# Patient Record
Sex: Male | Born: 1980 | Race: White | Hispanic: No | Marital: Single
Health system: Midwestern US, Community
[De-identification: ages and names within clinical notes are randomized; demographics above are authoritative.]

## PROBLEM LIST (undated history)

## (undated) DIAGNOSIS — K37 Unspecified appendicitis: Secondary | ICD-10-CM

## (undated) DIAGNOSIS — Z765 Malingerer [conscious simulation]: Secondary | ICD-10-CM

## (undated) DIAGNOSIS — M869 Osteomyelitis, unspecified: Secondary | ICD-10-CM

## (undated) DIAGNOSIS — I10 Essential (primary) hypertension: Secondary | ICD-10-CM

## (undated) DIAGNOSIS — N200 Calculus of kidney: Secondary | ICD-10-CM

## (undated) DIAGNOSIS — S31119A Laceration without foreign body of abdominal wall, unspecified quadrant without penetration into peritoneal cavity, initial encounter: Secondary | ICD-10-CM

## (undated) DIAGNOSIS — T63001A Toxic effect of unspecified snake venom, accidental (unintentional), initial encounter: Secondary | ICD-10-CM

## (undated) HISTORY — PX: WRIST SURGERY: SHX841

## (undated) HISTORY — PX: EXPLORATORY LAPAROTOMY: SUR591

## (undated) HISTORY — PX: WISDOM TOOTH EXTRACTION: SHX21

## (undated) HISTORY — PX: APPENDECTOMY: SHX54

---

## 2004-11-29 ENCOUNTER — Emergency Department: Payer: Self-pay | Admitting: Emergency Medicine

## 2005-01-20 ENCOUNTER — Emergency Department: Payer: Self-pay | Admitting: General Practice

## 2005-04-04 ENCOUNTER — Emergency Department: Payer: Self-pay | Admitting: Emergency Medicine

## 2008-12-14 DIAGNOSIS — S31119A Laceration without foreign body of abdominal wall, unspecified quadrant without penetration into peritoneal cavity, initial encounter: Secondary | ICD-10-CM

## 2008-12-14 HISTORY — DX: Laceration without foreign body of abdominal wall, unspecified quadrant without penetration into peritoneal cavity, initial encounter: S31.119A

## 2009-12-21 DIAGNOSIS — G8911 Acute pain due to trauma: Secondary | ICD-10-CM | POA: Insufficient documentation

## 2009-12-21 DIAGNOSIS — K6389 Other specified diseases of intestine: Secondary | ICD-10-CM | POA: Insufficient documentation

## 2010-01-15 ENCOUNTER — Emergency Department (HOSPITAL_COMMUNITY): Admission: EM | Admit: 2010-01-15 | Discharge: 2010-01-15 | Payer: Self-pay | Admitting: Emergency Medicine

## 2013-02-21 DIAGNOSIS — Z720 Tobacco use: Secondary | ICD-10-CM | POA: Insufficient documentation

## 2013-03-30 DIAGNOSIS — M869 Osteomyelitis, unspecified: Secondary | ICD-10-CM | POA: Insufficient documentation

## 2013-04-12 DIAGNOSIS — M40209 Unspecified kyphosis, site unspecified: Secondary | ICD-10-CM | POA: Insufficient documentation

## 2013-04-21 DIAGNOSIS — T310 Burns involving less than 10% of body surface: Secondary | ICD-10-CM | POA: Insufficient documentation

## 2013-05-04 ENCOUNTER — Emergency Department (HOSPITAL_COMMUNITY)
Admission: EM | Admit: 2013-05-04 | Discharge: 2013-05-05 | Disposition: A | Discharge status: Home / Self Care | Attending: Emergency Medicine | Admitting: Emergency Medicine

## 2013-05-04 ENCOUNTER — Emergency Department (HOSPITAL_COMMUNITY)

## 2013-05-04 DIAGNOSIS — S53401A Unspecified sprain of right elbow, initial encounter: Secondary | ICD-10-CM

## 2013-05-04 DIAGNOSIS — Z8739 Personal history of other diseases of the musculoskeletal system and connective tissue: Secondary | ICD-10-CM | POA: Insufficient documentation

## 2013-05-04 DIAGNOSIS — Y939 Activity, unspecified: Secondary | ICD-10-CM | POA: Insufficient documentation

## 2013-05-04 DIAGNOSIS — Y9210 Unspecified residential institution as the place of occurrence of the external cause: Secondary | ICD-10-CM | POA: Insufficient documentation

## 2013-05-04 DIAGNOSIS — IMO0002 Reserved for concepts with insufficient information to code with codable children: Secondary | ICD-10-CM | POA: Insufficient documentation

## 2013-05-04 DIAGNOSIS — M795 Residual foreign body in soft tissue: Secondary | ICD-10-CM | POA: Insufficient documentation

## 2013-05-04 DIAGNOSIS — IMO0001 Reserved for inherently not codable concepts without codable children: Secondary | ICD-10-CM | POA: Insufficient documentation

## 2013-05-04 DIAGNOSIS — W06XXXA Fall from bed, initial encounter: Secondary | ICD-10-CM | POA: Insufficient documentation

## 2013-05-04 DIAGNOSIS — Z79899 Other long term (current) drug therapy: Secondary | ICD-10-CM | POA: Insufficient documentation

## 2013-05-04 MED ORDER — FENTANYL CITRATE 0.05 MG/ML IJ SOLN
50.0000 ug | Freq: Once | INTRAMUSCULAR | Status: AC
  Administered 2013-05-04: 22:00:00 via INTRAVENOUS
  Filled 2013-05-04: qty 2

## 2013-05-04 NOTE — ED Notes (Addendum)
Fell on rt. Arm and there hyperextension or rt arm; stiff . Numbness, cap refill < 4 secs, numbness in fingers.

## 2013-05-04 NOTE — ED Notes (Signed)
Returned fm xray asking for pain medication

## 2013-05-04 NOTE — ED Notes (Signed)
Patient transported to X-ray 

## 2013-05-04 NOTE — ED Provider Notes (Signed)
History     CSN: [id number]  Arrival date & time 05/04/13  2034   First MD Initiated Contact with Patient 05/04/13 2132      Chief Complaint  Patient presents with  . Elbow Injury     Patient is a 32 y.o. male presenting with arm injury. The history is provided by the patient.  Arm Injury Location:  Elbow Elbow location:  R elbow Pain details:    Quality:  Aching   Radiates to:  Does not radiate   Severity:  Severe   Onset quality:  Sudden   Duration:  3 hours   Timing:  Constant   Progression:  Worsening Chronicity:  New Relieved by:  Nothing Worsened by:  Movement Associated symptoms: no neck pain    [facility] from [facility] He reports that he fell getting out of bed and landed on his right elbow and he think he injured the elbow He denies any head or neck injury.  No LOC.  No cp/sob.  No LE injury He has never had elbow surgery before   PMH - osteomyelitis of spine (he is on current meds - levaquin)  No past surgical history on file.  No family history on file.  History  Substance Use Topics  . Smoking status: Not on file  . Smokeless tobacco: Not on file  . Alcohol Use: Not on file      Review of Systems  HENT: Negative for neck pain.   Respiratory: Negative for shortness of breath.   Cardiovascular: Negative for chest pain.  Neurological: Negative for weakness.  All other systems reviewed and are negative.    Allergies  Penicillins; Sulfa antibiotics; and Toradol  Home Medications  No current outpatient prescriptions on file.  BP 136/91  Pulse 102  Temp(Src) 97.6 F (36.4 C) (Oral)  Resp 22  SpO2 100%  Physical Exam CONSTITUTIONAL: Well developed/well nourished HEAD: Normocephalic/atraumatic EYES: EOMI/PERRL ENMT: Mucous membranes moist NECK: supple no meningeal signs SPINE:no cervical spine tenderness. CV: S1/S2 noted, no murmurs/rubs/gallops noted LUNGS: Lungs are clear to auscultation bilaterally, no apparent distress ABDOMEN:  soft, nontender, no rebound or guarding GU:no cva tenderness NEURO: Pt is awake/alert, moves all extremitiesx4 EXTREMITIES: pulses normal/equal in bilateral UE.  He keeps right elbow extended and has diffuse tenderness to right elbow.  No lacerations noted.  He has well healed scar to right AC fossa.  He has no right wrist tenderness.  No right shoulder tenderness.  Distal motor intact on right UE SKIN: warm, color normal PSYCH: no abnormalities of mood noted  ED Course  Procedures  Labs Reviewed - No data to display Dg Elbow 2 Views Right  05/04/2013   *RADIOLOGY REPORT*  Clinical Data: Fall, right elbow pain  RIGHT ELBOW - 2 VIEW  Comparison: 01/15/2010  Findings: No fracture or dislocation is seen.  5 mm radiopaque foreign body in the medial/ventral soft tissues of the proximal forearm.  The presence of an elbow joint effusion cannot be assessed due to difficulty with patient positioning.  IMPRESSION: No fracture or dislocation is seen.  5 mm radiopaque foreign body along the medial aspect of the proximal forearm.   Original Report Authenticated By: Charline Bills, M.D.     No fx or dislocation noted He can now flex his right elbow and no bony deformity No other bony injury He has well healed scar to right AC fossa but no abscess or erythema His xray does reveal a foreign body that patient was unaware of  He did not report h/o IVDA reports that scar is from an IV at a previous hospital I did review records from [facility] via care everywhere and he was seen there for his osteomyelitis I feel he safe for d/c and he can f/u as outpatient and does not need this foreign body removed as it does not appear acute   MDM  Nursing notes including past medical history and social history reviewed and considered in documentation xrays reviewed and considered         Joya Gaskins, MD 05/05/13 0031

## 2013-05-05 NOTE — ED Notes (Signed)
Pt in neg visible distress

## 2013-05-06 ENCOUNTER — Encounter (HOSPITAL_BASED_OUTPATIENT_CLINIC_OR_DEPARTMENT_OTHER): Payer: Self-pay

## 2013-05-06 ENCOUNTER — Inpatient Hospital Stay (HOSPITAL_BASED_OUTPATIENT_CLINIC_OR_DEPARTMENT_OTHER)
Admission: EM | Admit: 2013-05-06 | Discharge: 2013-05-08 | DRG: 918 | Disposition: A | Discharge status: Home / Self Care | Payer: MEDICAID | Attending: Internal Medicine | Admitting: Internal Medicine

## 2013-05-06 DIAGNOSIS — M549 Dorsalgia, unspecified: Secondary | ICD-10-CM | POA: Diagnosis present

## 2013-05-06 DIAGNOSIS — Z720 Tobacco use: Secondary | ICD-10-CM

## 2013-05-06 DIAGNOSIS — Y929 Unspecified place or not applicable: Secondary | ICD-10-CM

## 2013-05-06 DIAGNOSIS — T6391XA Toxic effect of contact with unspecified venomous animal, accidental (unintentional), initial encounter: Principal | ICD-10-CM | POA: Diagnosis present

## 2013-05-06 DIAGNOSIS — F1120 Opioid dependence, uncomplicated: Secondary | ICD-10-CM

## 2013-05-06 DIAGNOSIS — F1920 Other psychoactive substance dependence, uncomplicated: Secondary | ICD-10-CM | POA: Diagnosis present

## 2013-05-06 DIAGNOSIS — K759 Inflammatory liver disease, unspecified: Secondary | ICD-10-CM

## 2013-05-06 DIAGNOSIS — T63001A Toxic effect of unspecified snake venom, accidental (unintentional), initial encounter: Secondary | ICD-10-CM

## 2013-05-06 DIAGNOSIS — G8929 Other chronic pain: Secondary | ICD-10-CM

## 2013-05-06 DIAGNOSIS — F17200 Nicotine dependence, unspecified, uncomplicated: Secondary | ICD-10-CM | POA: Diagnosis present

## 2013-05-06 LAB — CBC WITH DIFFERENTIAL/PLATELET
Basophils Absolute: 0 10*3/uL (ref 0.0–0.1)
Basophils Relative: 0 % (ref 0–1)
Eosinophils Relative: 4 % (ref 0–5)
HCT: 37.5 % — ABNORMAL LOW (ref 39.0–52.0)
HCT: 34.1 % — ABNORMAL LOW (ref 39.0–52.0)
Hemoglobin: 13.3 g/dL (ref 13.0–17.0)
Hemoglobin: 12 g/dL — ABNORMAL LOW (ref 13.0–17.0)
Lymphocytes Relative: 32 % (ref 12–46)
MCHC: 35.2 g/dL (ref 30.0–36.0)
Monocytes Relative: 9 % (ref 3–12)
Monocytes Relative: 10 % (ref 3–12)
Neutro Abs: 4.3 10*3/uL (ref 1.7–7.7)
Neutrophils Relative %: 44 % (ref 43–77)
Neutrophils Relative %: 54 % (ref 43–77)
Platelets: 224 10*3/uL (ref 150–400)
Platelets: 203 10*3/uL (ref 150–400)
RBC: 4.38 MIL/uL (ref 4.22–5.81)
RBC: 3.96 MIL/uL — ABNORMAL LOW (ref 4.22–5.81)
RDW: 12.8 % (ref 11.5–15.5)
RDW: 12.7 % (ref 11.5–15.5)
WBC: 8.1 10*3/uL (ref 4.0–10.5)

## 2013-05-06 LAB — PROTIME-INR: Prothrombin Time: 13 seconds (ref 11.6–15.2)

## 2013-05-06 LAB — BASIC METABOLIC PANEL
BUN: 10 mg/dL (ref 6–23)
CO2: 24 mEq/L (ref 19–32)
Calcium: 8.9 mg/dL (ref 8.4–10.5)
Glucose, Bld: 95 mg/dL (ref 70–99)
Sodium: 142 mEq/L (ref 135–145)

## 2013-05-06 LAB — FIBRINOGEN: Fibrinogen: 254 mg/dL (ref 204–475)

## 2013-05-06 MED ORDER — HYDROMORPHONE HCL PF 1 MG/ML IJ SOLN
INTRAMUSCULAR | Status: AC
  Filled 2013-05-06: qty 1

## 2013-05-06 MED ORDER — SODIUM CHLORIDE 0.9 % IV SOLN
4.0000 | Freq: Once | INTRAVENOUS | Status: AC
  Administered 2013-05-06: 40 mL via INTRAVENOUS
  Filled 2013-05-06: qty 20
  Filled 2013-05-06: qty 10

## 2013-05-06 MED ORDER — LORAZEPAM 2 MG/ML IJ SOLN
1.0000 mg | Freq: Once | INTRAMUSCULAR | Status: AC
  Administered 2013-05-06: 1 mg via INTRAVENOUS

## 2013-05-06 MED ORDER — HYDROMORPHONE HCL PF 1 MG/ML IJ SOLN
1.0000 mg | Freq: Once | INTRAMUSCULAR | Status: AC
  Administered 2013-05-06: 1 mg via INTRAVENOUS
  Filled 2013-05-06: qty 1

## 2013-05-06 MED ORDER — SODIUM CHLORIDE 0.9 % IV SOLN: INTRAVENOUS | Status: DC

## 2013-05-06 MED ORDER — HYDROMORPHONE HCL PF 2 MG/ML IJ SOLN
INTRAMUSCULAR | Status: AC
  Filled 2013-05-06: qty 1

## 2013-05-06 MED ORDER — LORAZEPAM 2 MG/ML IJ SOLN
INTRAMUSCULAR | Status: AC
  Administered 2013-05-06: 1 mg via INTRAVENOUS
  Filled 2013-05-06: qty 1

## 2013-05-06 MED ORDER — SODIUM CHLORIDE 0.9 % IV BOLUS (SEPSIS)
2000.0000 mL | Freq: Once | INTRAVENOUS | Status: AC
  Administered 2013-05-06: 2000 mL via INTRAVENOUS
  Administered 2013-05-06 (×2): 1000 mL via INTRAVENOUS

## 2013-05-06 MED ORDER — HYDROMORPHONE HCL PF 1 MG/ML IJ SOLN
INTRAMUSCULAR | Status: AC
  Administered 2013-05-06: 1 mg
  Filled 2013-05-06: qty 1

## 2013-05-06 MED ORDER — HYDROMORPHONE HCL PF 2 MG/ML IJ SOLN
2.0000 mg | Freq: Once | INTRAMUSCULAR | Status: AC
  Administered 2013-05-06: 2 mg via INTRAVENOUS

## 2013-05-06 MED ORDER — CROTALIDAE POLYVAL IMMUNE FAB IV SOLR
INTRAVENOUS | Status: AC
  Administered 2013-05-06: 10 mL via INTRAVENOUS
  Filled 2013-05-06: qty 40

## 2013-05-06 MED ORDER — NICOTINE 21 MG/24HR TD PT24
MEDICATED_PATCH | TRANSDERMAL | Status: AC
  Administered 2013-05-06: 21 mg
  Filled 2013-05-06: qty 1

## 2013-05-06 MED ORDER — HYDROMORPHONE HCL PF 1 MG/ML IJ SOLN
INTRAMUSCULAR | Status: AC
  Administered 2013-05-06: 1 mg via INTRAVENOUS
  Filled 2013-05-06: qty 1

## 2013-05-06 MED ORDER — HYDROMORPHONE HCL PF 1 MG/ML IJ SOLN
1.0000 mg | Freq: Once | INTRAMUSCULAR | Status: AC
  Administered 2013-05-06: 1 mg via INTRAVENOUS
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
## 2013-05-06 NOTE — ED Provider Notes (Signed)
History    This chart was scribed for Hurman Horn, MD, by Frederik Pear, ED scribe. The patient was seen in room MH02/MH02 and the patient's care was started at 1709.    CSN: [id number]  Arrival date & time 05/06/13  1651   First MD Initiated Contact with Patient 05/06/13 1709      Chief Complaint  Patient presents with  . Snake Bite    (Consider location/radiation/quality/duration/timing/severity/associated sxs/prior treatment) The history is provided by the patient and medical records. No language interpreter was used.    HPI Comments: Carl Valencia is a 32 y.o. male brought in by EMS who presents to the Emergency Department complaining of a copperhead snake bite to the right forearm with associated nausea and apprehension that occurred just after 1400. He states that the snake was laying in the grass when he was bitten. EMS states that he was given 300 mcg of Fentanyl, 4 mg of Zofran, and started a PIV en route. In ED, the bleeding is controlled. In ED, he complains of severe, constant, aching right forearm pain that is localized near the bite site with gradually improving nausea. Picc lines scars from a previous h/o of T-spine osteomyelitis are present in the right anticubital. Tetanus shot is up-to-date within the last 10 years.  History reviewed. No pertinent past medical history. 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 is in [place] at [facility] who prescribes the patient's chronic pain management, the patient has chronic elevated liver tests with a history of hepatitis but he is uncertain if it is hepatitis B or C, he denies any history of HIV  Past Surgical History  Procedure Laterality Date  . Exploratory laparotomy      History  reviewed. No pertinent family history.  History  Substance Use Topics  . Smoking status: Current Every Day Smoker -- 2.00 packs/day for 17 years    Types: Cigarettes  . Smokeless tobacco: Never Used  . Alcohol Use: No      Review of Systems A complete 10 system review of systems was obtained and all systems are negative except as noted in the HPI and PMH.  Allergies  Darvocet; Penicillins; Sulfa antibiotics; and Toradol  Home Medications   No current outpatient prescriptions on file.  BP 134/86  Pulse 88  Temp(Src) 98.9 F (37.2 C) (Oral)  Resp 20  Ht 5\' 9"  (1.753 m)  Wt 163 lb 6.4 oz (74.118 kg)  BMI 24.12 kg/m2  SpO2 99%  Physical Exam  Nursing note and vitals reviewed. Constitutional:  Awake, alert, nontoxic appearance.  HENT:  Head: Atraumatic.  Eyes: Right eye exhibits no discharge. Left eye exhibits no discharge.  Neck: Neck supple.  Cardiovascular: Regular rhythm, normal heart sounds and intact distal pulses.  Tachycardia present.  Exam reveals no gallop and no friction rub.   No murmur heard. CR less than 2 seconds all digits of the right hand.  Pulmonary/Chest: Effort normal. No respiratory distress. He has no wheezes. He has no rales. He exhibits no tenderness.  Abdominal: Soft. Bowel sounds are normal. He exhibits no distension and no mass. There is no tenderness. There is no rebound and no guarding.  Musculoskeletal: He exhibits tenderness. He exhibits no edema.  Baseline  ROM, no obvious new focal weakness.No edema or to the hand or wrist. Localized eryythema tenderness with minimal induration to the volvar distribution of the right mid-forearm. 2 tiny puncture wounds to the right distal third of the forearm. Right upper arm is non-tender.   Neurological:  Mental status and motor strength appears baseline for patient and situation. Normal light touch sensation is intact. Intact motor functions of the median radian ulnar distribution.  Skin: No rash noted.   Psychiatric: He has a normal mood and affect.    ED Course  Procedures (including critical care time)  DIAGNOSTIC STUDIES: Oxygen Saturation is 100% on room air, normal by my interpretation.    COORDINATION OF CARE:  17:15- Patient understands and agrees with initial ED impression and plan with expectations set for ED visit including IV fluids, Dilaudid, CBC with differential, Protime-INR, and basic metabolic panel.  17:21- Based on snake bite order set, his moderate score, including apprehension, nausea, tachycardia and great than 5 to 7 cm of induration at the bite site, recommends Crofab as indicated from the order set.  17:30- Medication Orders- 0.9% sodium chloride infusion- continuous, hydromorphone (dilaudid) injection 1 mg- once, crotalidae polyvalent immune fab (crofab) 4 vial in sodium chloride 0.9% 290 mL infusion- once.  18:30- Medication Orders- lorazepam (ativan) injection 1 mg- once, hydromorphone (dilaudid) injection 1 mg- once.  19:02- Recheck- HR is now less than 100. Pain is under better control. Induration has expanded to half of the upper arm.  19:15- Medication Orders- hydromorphone (dilaudid) injection 1 mg- once.  19:49- Medication Orders- hydromorphone (dilaudid) 1 mg/ml injection- once.  20:50- Upon recheck, his HR is still less than 100. Induration is stable and has not changed since previous recheck at 19:02. After reviewing the order set, poison control will be called to assess whether additional Crofab should be administered.  2230- the patient developed progression of erythema induration pain and tenderness towards his proximal right arm in ED after initial stabilization of local symptoms, labs repeated, case discussed with [facility] hospitalist for transfer to [facility], repeat Crofab bolus and start maintenance infusion ordered.  Results for orders placed during the hospital encounter of 05/06/13  CBC WITH DIFFERENTIAL      Result Value Range   WBC 8.1  4.0 -  10.5 K/uL   RBC 4.38  4.22 - 5.81 MIL/uL   Hemoglobin 13.3  13.0 - 17.0 g/dL   HCT 16.1 (*) 09.6 - 04.5 %   MCV 85.6  78.0 - 100.0 fL   MCH 30.4  26.0 - 34.0 pg   MCHC 35.5  30.0 - 36.0 g/dL   RDW 40.9  81.1 - 91.4 %   Platelets 224  150 - 400 K/uL   Neutrophils Relative % 44  43 - 77 %   Neutro Abs 3.6  1.7 - 7.7 K/uL   Lymphocytes Relative 41  12 - 46 %   Lymphs Abs 3.3  0.7 - 4.0 K/uL   Monocytes Relative 9  3 - 12 %   Monocytes Absolute 0.8  0.1 - 1.0 K/uL   Eosinophils Relative 6 (*) 0 - 5 %   Eosinophils Absolute 0.5  0.0 - 0.7 K/uL   Basophils Relative 0  0 - 1 %   Basophils Absolute 0.0  0.0 - 0.1 K/uL   Smear Review MORPHOLOGY UNREMARKABLE    PROTIME-INR      Result Value Range   Prothrombin Time 13.0  11.6 - 15.2 seconds   INR 0.99  0.00 -  1.49  BASIC METABOLIC PANEL      Result Value Range   Sodium 141  135 - 145 mEq/L   Potassium 3.4 (*) 3.5 - 5.1 mEq/L   Chloride 105  96 - 112 mEq/L   CO2 24  19 - 32 mEq/L   Glucose, Bld 95  70 - 99 mg/dL   BUN 10  6 - 23 mg/dL   Creatinine, Ser 4.54  0.50 - 1.35 mg/dL   Calcium 9.7  8.4 - 09.8 mg/dL   GFR calc non Af Amer >90  >90 mL/min   GFR calc Af Amer >90  >90 mL/min  FIBRINOGEN      Result Value Range   Fibrinogen 254  204 - 475 mg/dL  CBC WITH DIFFERENTIAL      Result Value Range   WBC 8.0  4.0 - 10.5 K/uL   RBC 3.96 (*) 4.22 - 5.81 MIL/uL   Hemoglobin 12.0 (*) 13.0 - 17.0 g/dL   HCT 11.9 (*) 14.7 - 82.9 %   MCV 86.1  78.0 - 100.0 fL   MCH 30.3  26.0 - 34.0 pg   MCHC 35.2  30.0 - 36.0 g/dL   RDW 56.2  13.0 - 86.5 %   Platelets 203  150 - 400 K/uL   Neutrophils Relative % 54  43 - 77 %   Lymphocytes Relative 32  12 - 46 %   Monocytes Relative 10  3 - 12 %   Eosinophils Relative 4  0 - 5 %   Basophils Relative 0  0 - 1 %   Neutro Abs 4.3  1.7 - 7.7 K/uL   Lymphs Abs 2.6  0.7 - 4.0 K/uL   Monocytes Absolute 0.8  0.1 - 1.0 K/uL   Eosinophils Absolute 0.3  0.0 - 0.7 K/uL   Basophils Absolute 0.0  0.0 - 0.1  K/uL   WBC Morphology WHITE COUNT CONFIRMED ON SMEAR     Smear Review PLATELET COUNT CONFIRMED BY SMEAR    PROTIME-INR      Result Value Range   Prothrombin Time 13.8  11.6 - 15.2 seconds   INR 1.07  0.00 - 1.49  FIBRINOGEN      Result Value Range   Fibrinogen 199 (*) 204 - 475 mg/dL  BASIC METABOLIC PANEL      Result Value Range   Sodium 142  135 - 145 mEq/L   Potassium 3.6  3.5 - 5.1 mEq/L   Chloride 109  96 - 112 mEq/L   CO2 24  19 - 32 mEq/L   Glucose, Bld 89  70 - 99 mg/dL   BUN 11  6 - 23 mg/dL   Creatinine, Ser 7.84  0.50 - 1.35 mg/dL   Calcium 8.9  8.4 - 69.6 mg/dL   GFR calc non Af Amer >90  >90 mL/min   GFR calc Af Amer >90  >90 mL/min   Labs Reviewed  CBC WITH DIFFERENTIAL - Abnormal; Notable for the following:    HCT 37.5 (*)    Eosinophils Relative 6 (*)    All other components within normal limits  BASIC METABOLIC PANEL - Abnormal; Notable for the following:    Potassium 3.4 (*)    All other components within normal limits  CBC WITH DIFFERENTIAL - Abnormal; Notable for the following:    RBC 3.96 (*)    Hemoglobin 12.0 (*)    HCT 34.1 (*)    All other components within normal limits  FIBRINOGEN - Abnormal;  Notable for the following:    Fibrinogen 199 (*)    All other components within normal limits  PROTIME-INR  FIBRINOGEN  PROTIME-INR  BASIC METABOLIC PANEL   No results found.   1. Snake bite poisoning, initial encounter   2. Chronic back pain   3. Chronic narcotic dependence   4. Hepatitis       MDM  I personally performed the services described in this documentation, which was scribed in my presence. The recorded information has been reviewed and is accurate. The patient appears reasonably stabilized for admission considering the current resources, flow, and capabilities available in the ED at this time, and I doubt any other [facility] requiring further screening and/or treatment in the ED prior to admission.        Hurman Horn,  MD 05/07/13 [phone]

## 2013-05-06 NOTE — ED Notes (Addendum)
Pt states that he was bitten on the R forearm, by a "copperhead" snake about 18 inches long.  Pt was laying down in the grass, and was bitten.  EMS started PIV, 20g in the L hand.  of Fentanyl given en route, zofran 4mg  given also.  Pt states that pain is 10/10, area of bite and reaction marked on R forearm.  No resp,cardiac, distress noted.  No nausea/vomiting

## 2013-05-06 NOTE — ED Notes (Signed)
The patient is crying and kicking his legs on the bed stating it hurts so bad. RN say the doctor will be in, in just a couple of minutes and the patient gets very up set about her statement. He states that he needs something for pain now.

## 2013-05-07 DIAGNOSIS — T65891A Toxic effect of other specified substances, accidental (unintentional), initial encounter: Secondary | ICD-10-CM

## 2013-05-07 DIAGNOSIS — F1920 Other psychoactive substance dependence, uncomplicated: Secondary | ICD-10-CM

## 2013-05-07 DIAGNOSIS — K759 Inflammatory liver disease, unspecified: Secondary | ICD-10-CM

## 2013-05-07 DIAGNOSIS — T63001A Toxic effect of unspecified snake venom, accidental (unintentional), initial encounter: Secondary | ICD-10-CM

## 2013-05-07 DIAGNOSIS — G8929 Other chronic pain: Secondary | ICD-10-CM

## 2013-05-07 DIAGNOSIS — T6391XA Toxic effect of contact with unspecified venomous animal, accidental (unintentional), initial encounter: Principal | ICD-10-CM

## 2013-05-07 DIAGNOSIS — M549 Dorsalgia, unspecified: Secondary | ICD-10-CM

## 2013-05-07 DIAGNOSIS — Z720 Tobacco use: Secondary | ICD-10-CM | POA: Diagnosis present

## 2013-05-07 DIAGNOSIS — F1120 Opioid dependence, uncomplicated: Secondary | ICD-10-CM

## 2013-05-07 MED ORDER — NICOTINE 21 MG/24HR TD PT24
21.0000 mg | MEDICATED_PATCH | Freq: Every day | TRANSDERMAL | Status: DC
  Administered 2013-05-07: 21 mg via TRANSDERMAL
  Filled 2013-05-07 (×2): qty 1

## 2013-05-07 MED ORDER — HYDROMORPHONE HCL PF 1 MG/ML IJ SOLN
1.0000 mg | INTRAMUSCULAR | Status: DC | PRN
  Administered 2013-05-07: 1 mg via INTRAVENOUS
  Filled 2013-05-07: qty 1
  Filled 2013-05-07: qty 2

## 2013-05-07 MED ORDER — HYDROMORPHONE HCL PF 1 MG/ML IJ SOLN
1.0000 mg | INTRAMUSCULAR | Status: DC | PRN
  Administered 2013-05-07 – 2013-05-08 (×12): 2 mg via INTRAVENOUS
  Filled 2013-05-07 (×12): qty 2

## 2013-05-07 MED ORDER — OXYCODONE HCL 5 MG PO TABS
15.0000 mg | ORAL_TABLET | ORAL | Status: DC | PRN
  Administered 2013-05-07 – 2013-05-08 (×8): 15 mg via ORAL
  Filled 2013-05-07 (×8): qty 3

## 2013-05-07 MED ORDER — ONDANSETRON HCL 4 MG/2ML IJ SOLN
4.0000 mg | Freq: Four times a day (QID) | INTRAMUSCULAR | Status: DC | PRN
  Administered 2013-05-08: 4 mg via INTRAVENOUS
  Filled 2013-05-07: qty 2

## 2013-05-07 MED ORDER — HYDROMORPHONE HCL PF 1 MG/ML IJ SOLN
2.0000 mg | Freq: Once | INTRAMUSCULAR | Status: AC
  Administered 2013-05-07: 2 mg via INTRAVENOUS
  Filled 2013-05-07: qty 2

## 2013-05-07 MED ORDER — OXYCODONE HCL 5 MG PO TABS
20.0000 mg | ORAL_TABLET | ORAL | Status: AC
  Administered 2013-05-07: 20 mg via ORAL
  Filled 2013-05-07: qty 4

## 2013-05-07 MED ORDER — SODIUM CHLORIDE 0.9 % IV SOLN: INTRAVENOUS | Status: AC

## 2013-05-07 MED ORDER — OXYCODONE HCL 5 MG PO TABS
15.0000 mg | ORAL_TABLET | Freq: Three times a day (TID) | ORAL | Status: DC
  Administered 2013-05-07: 15 mg via ORAL
  Filled 2013-05-07 (×2): qty 3

## 2013-05-07 MED ORDER — NALOXONE HCL 0.4 MG/ML IJ SOLN: 0.4000 mg | INTRAMUSCULAR | Status: DC | PRN

## 2013-05-07 MED ORDER — HYDROMORPHONE HCL PF 1 MG/ML IJ SOLN
1.0000 mg | INTRAMUSCULAR | Status: DC | PRN
  Administered 2013-05-07: 1 mg via INTRAVENOUS
  Filled 2013-05-07: qty 1

## 2013-05-07 MED ORDER — ONDANSETRON HCL 4 MG PO TABS: 4.0000 mg | ORAL_TABLET | Freq: Four times a day (QID) | ORAL | Status: DC | PRN

## 2013-05-07 MED ORDER — DOCUSATE SODIUM 100 MG PO CAPS
100.0000 mg | ORAL_CAPSULE | Freq: Two times a day (BID) | ORAL | Status: DC
  Administered 2013-05-07 – 2013-05-08 (×4): 100 mg via ORAL
  Filled 2013-05-07 (×5): qty 1

## 2013-05-07 NOTE — H&P (Signed)
[facility] History and Physical  Carl Valencia ZOX:[id number] DOB: 07-18-1981    PCP:   No PCP Per Patient   Chief Complaint: Copperhead snake bite on the right arm.  HPI: Carl Valencia is an 32 y.o. male with hx of chronic back pain, narcotic dependence on OxycodoneIR 15mg  TID, tobacco abuse, hx of chronic osteomyelitis, tobacco abuse, presents to [facility] with a copperhead snake bite at about 14:00.  He was started on the initial Crofab at 4mg  IV infusion.  He has severe pain and swelling, but it had responded to treatment, so when I saw him at [facility], the swelling has regressed.  He does have some red streaking toward his shoulder.  He doesn't have any chest pain, SOB, or chills.  There is no numbness of his right arm.   He is not from here, so he doesn't yet have a PCP.  He ran out of his chronic pain medication.  He said he was UTD with his tetanus shot.  In the hospital, he had a brief episode of agitation asking for pain meds.  When I saw him, however, he was nice and cooperative.    Rewiew of Systems:  Constitutional: Negative for malaise, fever and chills. No significant weight loss or weight gain Eyes: Negative for eye pain, redness and discharge, diplopia, visual changes, or flashes of light. ENMT: Negative for ear pain, hoarseness, nasal congestion, sinus pressure and sore throat. No headaches; tinnitus, drooling, or problem swallowing. Cardiovascular: Negative for chest pain, palpitations, diaphoresis, dyspnea and peripheral edema. ; No orthopnea, PND Respiratory: Negative for cough, hemoptysis, wheezing and stridor. No pleuritic chestpain. Gastrointestinal: Negative for nausea, vomiting, diarrhea, constipation, abdominal pain, melena, blood in stool, hematemesis, jaundice and rectal bleeding.    Genitourinary: Negative for frequency, dysuria, incontinence,flank pain and hematuria; Musculoskeletal: Negative for neck pain.  Skin: . Negative for pruritus, rash, abrasions, bruising  and skin lesion.; ulcerations Neuro: Negative for headache, lightheadedness and neck stiffness. Negative for weakness, altered level of consciousness , altered mental status, extremity weakness, burning feet, involuntary movement, seizure and syncope.  Psych: negative for anxiety, depression, insomnia, tearfulness, panic attacks, hallucinations, paranoia, suicidal or homicidal ideation   History reviewed. No pertinent past medical history.  Past Surgical History  Procedure Laterality Date  . Exploratory laparotomy      Medications:  HOME MEDS: Prior to Admission medications   Medication Sig Start Date End Date Taking? Authorizing Provider  levofloxacin (LEVAQUIN) 750 MG tablet Take 750 mg by mouth 2 (two) times daily.   Yes Historical Provider, MD  oxycodone (OXY-IR) 5 MG capsule Take 5 mg by mouth every 4 (four) hours as needed for pain (take every 4 to 6 hours as needed for pain).   Yes Historical Provider, MD  fentaNYL (SUBLIMAZE) 0.05 MG/ML injection Inject 300 mcg into the vein once.    Historical Provider, MD  ondansetron (ZOFRAN) 4 MG/2ML SOLN injection Inject 4 mg into the vein once.    Historical Provider, MD     Allergies:  Allergies  Allergen Reactions  . Penicillins   . Sulfa Antibiotics   . Toradol (Ketorolac Tromethamine)     Social History:   reports that he has been smoking Cigarettes.  He has a 34 pack-year smoking history. He has never used smokeless tobacco. He reports that he does not drink alcohol or use illicit drugs.  Family History: History reviewed. No pertinent family history.   Physical Exam: Filed Vitals:   05/07/13 0105 05/07/13 0112 05/07/13 0115 05/07/13  0620  BP:  145/95  133/82  Pulse:  74  89  Temp:  98.7 F (37.1 C)  99 F (37.2 C)  TempSrc:  Oral  Oral  Resp:  16  16  Height: 5\' 9"  (1.753 m)  5\' 9"  (1.753 m)   Weight: 74.118 kg (163 lb 6.4 oz)  74.118 kg (163 lb 6.4 oz)   SpO2:  100%  98%   Blood pressure 133/82, pulse 89,  temperature 99 F (37.2 C), temperature source Oral, resp. rate 16, height 5\' 9"  (1.753 m), weight 74.118 kg (163 lb 6.4 oz), SpO2 98.00%.  GEN:  Pleasant patient lying in the stretcher in no acute distress; cooperative with exam. PSYCH:  alert and oriented x4; does not appear anxious or depressed; affect is appropriate. HEENT: Mucous membranes pink and anicteric; PERRLA; EOM intact; no cervical lymphadenopathy nor thyromegaly or carotid bruit; no JVD; There were no stridor. Neck is very supple. Breasts:: Not examined CHEST WALL: No tenderness CHEST: Normal respiration, clear to auscultation bilaterally.  HEART: Regular rate and rhythm.  There are no murmur, rub, or gallops.   BACK: No kyphosis or scoliosis; no CVA tenderness ABDOMEN: soft and non-tender; no masses, no organomegaly, normal abdominal bowel sounds; no pannus; no intertriginous candida. There is no rebound and no distention. Rectal Exam: Not done EXTREMITIES: No bone or joint deformity; age-appropriate arthropathy of the hands and knees; no edema; no ulcerations.  There is no calf tenderness. There are red streak toward his right shoulder, but no swelling. Neurovascular intact. Genitalia: not examined PULSES: 2+ and symmetric SKIN: Normal hydration no rash or ulceration CNS: Cranial nerves 2-12 grossly intact no focal lateralizing neurologic deficit.  Speech is fluent; uvula elevated with phonation, facial symmetry and tongue midline. DTR are normal bilaterally, cerebella exam is intact, barbinski is negative and strengths are equaled bilaterally.  No sensory loss.   Labs on Admission:  Basic Metabolic Panel:  Recent Labs Lab 05/06/13 1700 05/06/13 2225  NA 141 142  K 3.4* 3.6  CL 105 109  CO2 24 24  GLUCOSE 95 89  BUN 10 11  CREATININE 0.90 0.80  CALCIUM 9.7 8.9   Liver Function Tests: No results found for this basename: AST, ALT, ALKPHOS, BILITOT, PROT, ALBUMIN,  in the last 168 hours No results found for this  basename: LIPASE, AMYLASE,  in the last 168 hours No results found for this basename: AMMONIA,  in the last 168 hours CBC:  Recent Labs Lab 05/06/13 1700 05/06/13 2225  WBC 8.1 8.0  NEUTROABS 3.6 4.3  HGB 13.3 12.0*  HCT 37.5* 34.1*  MCV 85.6 86.1  PLT 224 203   Cardiac Enzymes: No results found for this basename: CKTOTAL, CKMB, CKMBINDEX, TROPONINI,  in the last 168 hours  CBG: No results found for this basename: GLUCAP,  in the last 168 hours   Radiological Exams on Admission: No results found.   Assessment/Plan Present on Admission:  . Tobacco abuse Copperhead snake bite Hx of polysubstance abuse Chronic back pain Narcotic dependence.  PLAN:  He responded to initial Crofab, and doesn't need any more doses.  I have stopped the antibiotic.  It is not indicated in snake bites.  I will give him his base dose of OxycodoneIR 15mg  TID, and added Dilaudid 1-2 mg every 3 hours.  We will watch for oversedation carefully.  He is stable, full code, and will be admitted to [facility] service.  I have asked that he quit cigarettes and told him  he cannot smoke in the hospital.  Have started Nicotine patch at 21mg  dose as well.    Other plans as per orders.  Code Status: FULL Unk Lightning, MD. [facility] Pager [phone] 7pm to 7am.  05/07/2013, 6:34 AM

## 2013-05-07 NOTE — Progress Notes (Signed)
Utilization review completed.  P.J. Divit Stipp,RN,BSN Case Manager [phone]  

## 2013-05-07 NOTE — Progress Notes (Signed)
[facility]                                                                                Patient Demographics  Carl Valencia, is a 32 y.o. male, DOB - [birth year], AVW:[id number], [id number]:[id number]  Admit date - 05/06/2013  Admitting Physician Ron Parker, MD  Outpatient Primary MD for the patient is No PCP Per Patient  LOS - 1   Chief Complaint  Patient presents with  . Snake Bite        Assessment & Plan    1. 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 Likely will discharge tomorrow.   2. History of chronic back pain, hepatitis, chronic narcotic abuse and dependence, tobacco abuse. Outpatient followup with PCP, I have counseled him not to overuse narcotics, when necessary Narcan, continuous pulse ox monitoring. Counseled to quit smoking.       Code Status: Full  Family Communication: None present  Disposition Plan: Home   Procedures none   Consults  poison control over the phone discussed on 05/07/2013 12 PM   DVT Prophylaxis  SCDs  Lab Results  Component Value Date   PLT 203 05/06/2013    Medications  Scheduled Meds: . sodium chloride   Intravenous STAT  . docusate sodium  100 mg Oral BID  . nicotine  21 mg Transdermal Daily   Continuous Infusions: . sodium chloride     PRN Meds:.HYDROmorphone (DILAUDID) injection, naLOXone (NARCAN)  injection, ondansetron (ZOFRAN) IV, ondansetron, oxyCODONE  Antibiotics     Anti-infectives   None       Time Spent in minutes 35   SINGH,PRASHANT K M.D on 05/07/2013 at 11:58 AM  Between 7am to 7pm - Pager - [phone]  After 7pm go to [email or web address] -  password [email or web address]  And look for the night coverage person covering for me after hours  [facility] Office  [phone]    Subjective:   [name] today has, No headache, No chest pain, No abdominal pain - No Nausea, No new weakness tingling or numbness, No Cough - SOB. Has chronic back pain and now has right arm pain.  Objective:   Filed Vitals:   05/07/13 0112 05/07/13 0115 05/07/13 0620 05/07/13 1016  BP: 145/95  133/82 134/86  Pulse: 74  89 88  Temp: 98.7 F (37.1 C)  99 F (37.2 C) 98.9 F (37.2 C)  TempSrc: Oral  Oral Oral  Resp: 16  16 20   Height:  5\' 9"  (1.753 m)    Weight:  74.118 kg (163 lb 6.4 oz)    SpO2: 100%  98% 99%    Wt Readings from Last 3 Encounters:  05/07/13 74.118 kg (163 lb 6.4 oz)     Intake/Output Summary (Last 24 hours) at 05/07/13 1158 Last data filed at 05/07/13 0900  Gross per 24 hour  Intake   2720 ml  Output   1200 ml  Net   1520 ml    Exam Awake Alert, Oriented X 3, No new F.N deficits, Normal affect [place].AT,PERRAL Supple Neck,No JVD, No cervical lymphadenopathy appriciated.  Symmetrical Chest wall movement, Good air movement bilaterally, CTAB RRR,No Gallops,Rubs or new Murmurs, No Parasternal Heave +ve B.Sounds, Abd Soft, Non tender, No organomegaly appriciated, No rebound - guarding or rigidity. No Cyanosis, Clubbing or edema, No new Rash or bruise except R. has some bruising area has been demarcated, mild edema, his arm is still soft, no signs of compartment syndrome, no loss of sensation in fingers.   Data Review   Micro Results No results found for this or any previous visit (from the past 240 hour(s)).  Radiology Reports Dg Elbow 2 Views Right  05/04/2013   *RADIOLOGY REPORT*  Clinical Data: Right elbow pain.  RIGHT ELBOW - 2 VIEW  Comparison: Earlier films, same date.  Findings: The joint spaces are maintained.  No acute fracture or joint effusion.  Stable small radiopaque foreign body noted in the ulnar  sided soft tissues.  IMPRESSION: No acute bony findings or joint effusion. Stable small radiopaque foreign body.   Original Report Authenticated By: Rudie Meyer, M.D.   Dg Elbow 2 Views Right  05/04/2013   *RADIOLOGY REPORT*  Clinical Data: Fall, right elbow pain  RIGHT ELBOW - 2 VIEW  Comparison: 01/15/2010  Findings: No fracture or dislocation is seen.  5 mm radiopaque foreign body in the medial/ventral soft tissues of the proximal forearm.  The presence of an elbow joint effusion cannot be assessed due to difficulty with patient positioning.  IMPRESSION: No fracture or dislocation is seen.  5 mm radiopaque foreign body along the medial aspect of the proximal forearm.   Original Report Authenticated By: Charline Bills, M.D.    CBC  Recent Labs Lab 05/06/13 1700 05/06/13 2225  WBC 8.1 8.0  HGB 13.3 12.0*  HCT 37.5* 34.1*  PLT 224 203  MCV 85.6 86.1  MCH 30.4 30.3  MCHC 35.5 35.2  RDW 12.8 12.7  LYMPHSABS 3.3 2.6  MONOABS 0.8 0.8  EOSABS 0.5 0.3  BASOSABS 0.0 0.0    Chemistries   Recent Labs Lab 05/06/13 1700 05/06/13 2225  NA 141 142  K 3.4* 3.6  CL 105 109  CO2 24 24  GLUCOSE 95 89  BUN 10 11  CREATININE 0.90 0.80  CALCIUM 9.7 8.9   ------------------------------------------------------------------------------------------------------------------ estimated creatinine clearance is 132.6 ml/min (by C-G formula based on Cr of 0.8). ------------------------------------------------------------------------------------------------------------------ No results found for this basename: HGBA1C,  in the last 72 hours ------------------------------------------------------------------------------------------------------------------ No results found for this basename: CHOL, HDL, LDLCALC, TRIG, CHOLHDL, LDLDIRECT,  in the last 72 hours ------------------------------------------------------------------------------------------------------------------ No results found for this  basename: TSH, T4TOTAL, FREET3, T3FREE, THYROIDAB,  in the last 72 hours ------------------------------------------------------------------------------------------------------------------ No results found for this basename: VITAMINB12, FOLATE, FERRITIN, TIBC, IRON, RETICCTPCT,  in the last 72 hours  Coagulation profile  Recent Labs Lab 05/06/13 1700 05/06/13 2225  INR 0.99 1.07    No results found for this basename: DDIMER,  in the last 72 hours  Cardiac Enzymes No results found for this basename: CK, CKMB, TROPONINI, MYOGLOBIN,  in the last 168 hours ------------------------------------------------------------------------------------------------------------------ No components found with this basename: POCBNP,

## 2013-05-07 NOTE — Progress Notes (Signed)
Pt asked for Pass to leave floor. Pt told that we had to call Md and make aware prior to patient leaving for safety reasons and that MD hasnt examined him yet.  Pt became slightly agitated and walked the unit hallway, Pt walked towards elevators and was notified if he wants to leave that him agreeing to leaving AMA and possible need for security to be called. Pt became angry and loud, slamming his room door and security called to deescalate. Pt loud and agitated speaking with staff and security and house coverage also made aware and came to see patient. Pt asking for his nurse and if he can have pain medicine.  When Patient was about to receive his IV dilaudid patient very hostile, agitated, and yelling at another nurse that was attempting to administer his medication. Pt upset at the dosage of medication and also argued, Yelling " why do you have to hook me up to the IV fluids to give me my IV pain med!"  Pt still hostile and agitated.

## 2013-05-07 NOTE — Progress Notes (Signed)
Patiernt returned to the unit. Call light in reach. Refused bed alarm. Refused for door to be open. Will continue to monitor thru out the night.   Doristine Devoid, RN

## 2013-05-07 NOTE — Progress Notes (Signed)
Pt admitted to unit. AOx4, ind. Pt has snake bite to right forearm with redness and swelling around bite spreading to right shoulder measuring 19.5 cm. Pt oriented to unit, staff, and safety measures. Told to use call bell light prior to getting oob d/t fall risk, patient red socks and yellow arm band put on, (patient took off socks to wear his own socks and shoes.  Pt was very agitated and tearful at the time complaining of pain. Admitting MD paged twice regarding patients condition.

## 2013-05-07 NOTE — Progress Notes (Signed)
Patient going off the floor. Stated he will be back in 10-15 minutes. Will look for patients return soon.   Iliyah Bui Hill, RN   

## 2013-05-07 NOTE — Progress Notes (Signed)
As per the pt. His pain level is 9 to 10.Pt. Has been asking for pain medicine every two hours.MD. Has been notified.keep monitoring pt. Closely and assessing his needs.

## 2013-05-08 ENCOUNTER — Inpatient Hospital Stay (HOSPITAL_COMMUNITY): Payer: Self-pay

## 2013-05-08 LAB — PROTIME-INR: Prothrombin Time: 12.5 seconds (ref 11.6–15.2)

## 2013-05-08 LAB — CBC
HCT: 36.9 % — ABNORMAL LOW (ref 39.0–52.0)
Hemoglobin: 13.4 g/dL (ref 13.0–17.0)
MCH: 30.3 pg (ref 26.0–34.0)
MCV: 83.5 fL (ref 78.0–100.0)
RBC: 4.42 MIL/uL (ref 4.22–5.81)

## 2013-05-08 LAB — BASIC METABOLIC PANEL
CO2: 24 mEq/L (ref 19–32)
Chloride: 100 mEq/L (ref 96–112)
Creatinine, Ser: 0.71 mg/dL (ref 0.50–1.35)

## 2013-05-08 MED ORDER — IOHEXOL 300 MG/ML  SOLN
100.0000 mL | Freq: Once | INTRAMUSCULAR | Status: AC | PRN
  Administered 2013-05-08: 100 mL via INTRAVENOUS

## 2013-05-08 MED ORDER — OXYCODONE HCL 15 MG PO TABS: 15.0000 mg | ORAL_TABLET | ORAL | Status: DC | PRN

## 2013-05-08 MED ORDER — SENNA 8.6 MG PO TABS
2.0000 | ORAL_TABLET | Freq: Every day | ORAL | Status: DC
  Administered 2013-05-08: 17.2 mg via ORAL
  Filled 2013-05-08: qty 2

## 2013-05-08 MED ORDER — NICOTINE 21 MG/24HR TD PT24: 1.0000 | MEDICATED_PATCH | Freq: Every day | TRANSDERMAL | Status: DC

## 2013-05-08 MED ORDER — IOHEXOL 300 MG/ML  SOLN
25.0000 mL | INTRAMUSCULAR | Status: AC
  Administered 2013-05-08 (×2): 25 mL via ORAL

## 2013-05-08 MED ORDER — POLYETHYLENE GLYCOL 3350 17 G PO PACK
17.0000 g | PACK | Freq: Every day | ORAL | Status: DC
  Administered 2013-05-08: 17 g via ORAL
  Filled 2013-05-08: qty 1

## 2013-05-08 MED ORDER — POLYETHYLENE GLYCOL 3350 17 G PO PACK: 17.0000 g | PACK | Freq: Every day | ORAL | Status: DC | PRN

## 2013-05-08 NOTE — Progress Notes (Addendum)
[facility]                                                                                   Carl Valencia, is a 32 y.o. male  DOB 1981/09/24  MRN [id number].  Admission date:  05/06/2013  Discharge Date:  05/08/2013  Primary MD  No PCP Per Patient  Admitting Physician  Ron Parker, MD  Admission Diagnosis  Hepatitis [573.3] Chronic back pain [724.5, 338.29] Chronic narcotic dependence [304.91] Snake bite poisoning, initial encounter [989.5, E980.9]  Discharge Diagnosis     Active Problems:   Snake bite poisoning   Chronic narcotic dependence   Hepatitis   Chronic back pain   Tobacco abuse    History reviewed. No pertinent past medical history.  Past Surgical History  Procedure Laterality Date  . Exploratory laparotomy       Recommendations for primary care physician for things to follow:      Discharge Diagnoses:   Active Problems:   Snake bite poisoning   Chronic narcotic dependence   Hepatitis   Chronic back pain   Tobacco abuse    Discharge Condition: stable   Diet recommendation: See Discharge Instructions below   Consults poison control    History of present illness and  Hospital Course:     Kindly see H&P for history of present illness and admission details, please review complete Labs, Consult reports and Test reports for all details in brief Carl Valencia, is a 32 y.o. male, patient with history of chronic back pain and narcotic use, smoking, hepatitis, who was admitted after an accidental copperhead snake bite to his right forearm, he was admitted for observation after discussions with poison control by me personally, his lab work is essentially unremarkable, he does have some superficial bruising up to his right shoulder, on exam his right forearm site is soft with 1 cm induration at the bite site, no signs of fasciitis or compartment syndrome, his arm is soft range of motion is stable, good sensation, he will be discharged  on pain medications at outpatient followup with PCP or urgent care.  Patient did complain of some vague abdominal pain, to nursing staff he had complained of excruciating left lower quadrant abdominal pain with some nausea, when I came to see the patient he was sleeping comfortably in the bed, he woke up and he said his pain was in the right upper quadrant, I reminded him what he told the nurses anything he said he got confused, his abdominal exam is absolutely unremarkable, he had BM this morning, he said he is willing to go home on pain medications which will be provided for his primary problem of snake bite, I will give him MiraLAX as he says he remains constipated intermittently.  KUB shows  large amount of stool And no other acute abnormality, this is due to chronic narcotic use,Patient counseled not to overuse narcotics and to take MiraLAX as ordered.  I highly suspect patient has narcotic seeking behavior, he says that he takes high-dose narcotics at home which have been prescribed to him for chronic discitis, he is also scheduled for surgery for his discitis, he does not know  how he developed discitis    I have counseled him not to miss use narcotics, and to quit smoking.    After I discharged him he started complaining of more abdominal pain and has requesting a CT scan which will be ordered, of note I think he has large stool burden due to chronic narcotic use in this could be constipating to his abdominal discomfort, his exam is completely benign and his history is highly unreliable. He appears in no distress, his vital signs are stable along with his lab work, We will check CT scan of the abdomen, if no uterine abnormalities are found he will be discharged home.   Note CT shows ++ stool only.     Today   Subjective:   Carl Valencia today has no headache,no chest, mild vague abdominal pain,no new weakness tingling or numbness, improving L arm pain, feels much better     Objective:   Blood pressure 100/84, pulse 87, temperature 98.7 F (37.1 C), temperature source Oral, resp. rate 17, height 5\' 9"  (1.753 m), weight 74.118 kg (163 lb 6.4 oz), SpO2 100.00%.   Intake/Output Summary (Last 24 hours) at 05/08/13 1052 Last data filed at 05/08/13 0830  Gross per 24 hour  Intake   1420 ml  Output      0 ml  Net   1420 ml    Exam Awake Alert, Oriented *3, No new F.N deficits, Normal affect [place].AT,PERRAL Supple Neck,No JVD, No cervical lymphadenopathy appriciated.  Symmetrical Chest wall movement, Good air movement bilaterally, CTAB RRR,No Gallops,Rubs or new Murmurs, No Parasternal Heave +ve B.Sounds, Abd Soft, Non tender, No organomegaly appriciated, No rebound -guarding or rigidity. No Cyanosis, Clubbing or edema, No new Rash or bruise  Data Review   Major procedures and Radiology Reports - PLEASE review detailed and final reports for all details in brief -        Micro Results      No results found for this or any previous visit (from the past 240 hour(s)).   CBC w Diff: Lab Results  Component Value Date   WBC 8.7 05/08/2013   HGB 13.4 05/08/2013   HCT 36.9* 05/08/2013   PLT 187 05/08/2013   LYMPHOPCT 32 05/06/2013   MONOPCT 10 05/06/2013   EOSPCT 4 05/06/2013   BASOPCT 0 05/06/2013    CMP: Lab Results  Component Value Date   NA 138 05/08/2013   K 3.7 05/08/2013   CL 100 05/08/2013   CO2 24 05/08/2013   BUN 10 05/08/2013   CREATININE 0.71 05/08/2013  .   Discharge Instructions     Follow with Primary MD or nearest Urgent care in 1 days   Get CBC, CMP, checked 1 days by Primary MD and again as instructed by your Primary MD.   Get Medicines reviewed and adjusted.  Please request your Prim.MD to go over all Hospital Tests and Procedure/Radiological results at the follow up, please get all Hospital records sent to your Prim MD by signing hospital release before you go home.  Activity: As tolerated with Full fall precautions use  walker/cane & assistance as needed   Diet:  Heart Healthy  For Heart failure patients - Check your Weight same time everyday, if you gain over 2 pounds, or you develop in leg swelling, experience more shortness of breath or chest pain, call your Primary MD immediately. Follow Cardiac Low Salt Diet and 1.8 lit/day fluid restriction.  Disposition Home    If you experience worsening of  your admission symptoms, develop shortness of breath, life threatening emergency, suicidal or homicidal thoughts you must seek medical attention immediately by calling 911 or calling your MD immediately  if symptoms less severe.  You Must read complete instructions/literature along with all the possible adverse reactions/side effects for all the Medicines you take and that have been prescribed to you. Take any new Medicines after you have completely understood and accpet all the possible adverse reactions/side effects.   Do not drive and provide baby sitting services if your were admitted for syncope or siezures until you have seen by Primary MD or a Neurologist and advised to do so again.  Do not drive when taking Pain medications.    Do not take more than prescribed Pain, Sleep and Anxiety Medications  Special Instructions: If you have smoked or chewed Tobacco  in the last 2 yrs please stop smoking, stop any regular Alcohol  and or any Recreational drug use.  Wear Seat belts while driving.  Follow-up Information   Follow up with PCP or nearest Urgent care . Schedule an appointment as soon as possible for a visit in 1 day.        Discharge Medications     Medication List    TAKE these medications       nicotine 21 mg/24hr patch  Commonly known as:  NICODERM CQ - dosed in mg/24 hours  Place 1 patch onto the skin daily.     oxyCODONE 15 MG immediate release tablet  Commonly known as:  ROXICODONE  Take 1 tablet (15 mg total) by mouth every 4 (four) hours as needed.     polyethylene glycol packet   Commonly known as:  MIRALAX / GLYCOLAX  Take 17 g by mouth daily as needed (constipation).           Total Time in preparing paper work, data evaluation and todays exam - 35 minutes  Leroy Sea M.D on 05/08/2013 at 10:52 AM  [facility] Office  [phone]

## 2013-05-08 NOTE — Progress Notes (Signed)
Patient now complaining of left lower quadrant pain in his abdomen. Hurts on palpation. Stated he is hurting 8 out of 10 and that it is a bad throbbing pain and he thinks it is swelling. He is still requesting more pain medication. Informed him that he can not have any medication yet. On Call MD notified. New orders received. Will continue to monitor the patient.  Sahian Kerney Jeanmarie Hubert

## 2013-05-08 NOTE — Care Management Note (Addendum)
   CARE MANAGEMENT NOTE 05/08/2013  Patient:  Carl Valencia, Carl Valencia   Account Number:  [id number]  Date Initiated:  05/08/2013  Documentation initiated by:  Johny Shock  Subjective/Objective Assessment:   Consult to case manager for assist with pcp for this pt.     Action/Plan:   Met with pt who lives in [place], [place], per pt this is [facility] and pt states that his mother lives there and as he just moved back she will asist with find PCP or local clinic. Pt politely declined CM assistance.   Anticipated DC Date:  05/10/2013   Anticipated DC Plan:  HOME/SELF CARE         Choice offered to / List presented to:             Status of service:  Completed, signed off Medicare Important Message given?   (If response is "NO", the following Medicare IM given date fields will be blank) Date Medicare IM given:   Date Additional Medicare IM given:    Discharge Disposition:  HOME/SELF CARE  Per UR Regulation:    If discussed at Long Length of Stay Meetings, dates discussed:    Comments:

## 2013-05-08 NOTE — Progress Notes (Signed)
Patient going off the floor. Stated he will be back in 10-15 minutes. Will look for patients return soon.   Doristine Devoid, RN

## 2013-05-08 NOTE — Progress Notes (Signed)
Patiernt returned to the unit. Call light in reach. Still refused bed alarm. Refused for door to be open. Will continue to monitor thru out the night.     Shawna Wearing Jeanmarie Hubert

## 2013-05-09 ENCOUNTER — Encounter (HOSPITAL_COMMUNITY): Payer: Self-pay | Admitting: *Deleted

## 2013-05-09 ENCOUNTER — Emergency Department (HOSPITAL_COMMUNITY)
Admission: EM | Admit: 2013-05-09 | Discharge: 2013-05-09 | Disposition: A | Discharge status: Home / Self Care | Payer: MEDICAID | Attending: Emergency Medicine | Admitting: Emergency Medicine

## 2013-05-09 DIAGNOSIS — F17200 Nicotine dependence, unspecified, uncomplicated: Secondary | ICD-10-CM | POA: Insufficient documentation

## 2013-05-09 DIAGNOSIS — S40019A Contusion of unspecified shoulder, initial encounter: Secondary | ICD-10-CM | POA: Insufficient documentation

## 2013-05-09 DIAGNOSIS — Y929 Unspecified place or not applicable: Secondary | ICD-10-CM | POA: Insufficient documentation

## 2013-05-09 DIAGNOSIS — S40029A Contusion of unspecified upper arm, initial encounter: Secondary | ICD-10-CM | POA: Insufficient documentation

## 2013-05-09 DIAGNOSIS — W5911XD Bitten by nonvenomous snake, subsequent encounter: Secondary | ICD-10-CM

## 2013-05-09 DIAGNOSIS — Z880 Allergy status to penicillin: Secondary | ICD-10-CM | POA: Insufficient documentation

## 2013-05-09 DIAGNOSIS — Y939 Activity, unspecified: Secondary | ICD-10-CM | POA: Insufficient documentation

## 2013-05-09 DIAGNOSIS — T63001A Toxic effect of unspecified snake venom, accidental (unintentional), initial encounter: Secondary | ICD-10-CM | POA: Insufficient documentation

## 2013-05-09 LAB — PROTIME-INR
INR: 1.06 (ref 0.00–1.49)
Prothrombin Time: 13.7 seconds (ref 11.6–15.2)

## 2013-05-09 MED ORDER — HYDROMORPHONE HCL PF 1 MG/ML IJ SOLN
1.0000 mg | Freq: Once | INTRAMUSCULAR | Status: AC
  Administered 2013-05-09: 1 mg via INTRAMUSCULAR
  Filled 2013-05-09: qty 1

## 2013-05-09 MED ORDER — ONDANSETRON 4 MG PO TBDP
4.0000 mg | ORAL_TABLET | Freq: Once | ORAL | Status: AC
  Administered 2013-05-09: 4 mg via ORAL
  Filled 2013-05-09: qty 1

## 2013-05-09 MED ORDER — OXYCODONE HCL 5 MG PO TABS
15.0000 mg | ORAL_TABLET | Freq: Once | ORAL | Status: AC
  Administered 2013-05-09: 15 mg via ORAL
  Filled 2013-05-09: qty 3

## 2013-05-09 MED ORDER — ONDANSETRON 4 MG PO TBDP: 4.0000 mg | ORAL_TABLET | Freq: Once | ORAL | Status: DC

## 2013-05-09 NOTE — ED Notes (Signed)
Pt reports he was bit by copperhead on Friday, hospitalized and discharged yesterday. Told that if pain or bruising came back to return to ED. Pt has bruising noted up in to right shoulder. Snake bite to right forearm. Pain 9/10- describes as burning. Reports nausea/vomiting throughout night x 5.

## 2013-05-09 NOTE — ED Provider Notes (Signed)
History     CSN: [id number]  Arrival date & time 05/09/13  7829   First MD Initiated Contact with Patient 05/09/13 [phone]      Chief Complaint  Patient presents with  . Snake Bite  . Emesis    HPI 32 year old male presents complaining of arm pain and bruising. He was seen here about 3 days ago with a poisonous snake bite. He did not require intervention ministration that time. He was discharged home with 15 mg oxycodone states initially his pain was well controlled at home when he accidentally had his pain medicines filled. However last night his pain became much worse and he developed some bruising of the lateral upper arm and shoulder which is distant from the snake bite site. His bite was to his right hand. He also had to 3 episodes of vomiting both last night and this morning. His pain is not obtained, burning, aggravated by nothing, relieved by nothing.  Of note, although the patient reports that the bruising to his arm is new, his hospital discharge summary notes that he did have some bruising in the same distribution at the time of discharge. It also notes that he exhibited behavior during his admission concerning for narcotic seeking behavior.   History reviewed. No pertinent past medical history.  Past Surgical History  Procedure Laterality Date  . Exploratory laparotomy      No family history on file.  History  Substance Use Topics  . Smoking status: Current Every Day Smoker -- 2.00 packs/day for 17 years    Types: Cigarettes  . Smokeless tobacco: Never Used  . Alcohol Use: No      Review of Systems  Constitutional: Negative for fever and chills.  HENT: Negative for congestion, rhinorrhea, neck pain and neck stiffness.   Eyes: Negative for visual disturbance.  Respiratory: Negative for cough and shortness of breath.   Cardiovascular: Negative for chest pain and leg swelling.  Gastrointestinal: Negative for nausea, vomiting, abdominal pain and diarrhea.   Genitourinary: Negative for dysuria, urgency, frequency, flank pain and difficulty urinating.  Musculoskeletal: Negative for back pain.  Skin: Negative for rash.  Neurological: Negative for syncope, weakness, numbness and headaches.  All other systems reviewed and are negative.    Allergies  Darvocet; Penicillins; Sulfa antibiotics; and Toradol  Home Medications   Current Outpatient Rx  Name  Route  Sig  Dispense  Refill  . nicotine (NICODERM CQ - DOSED IN MG/24 HOURS) 21 mg/24hr patch   Transdermal   Place 1 patch onto the skin daily.   28 patch   0   . oxyCODONE (ROXICODONE) 15 MG immediate release tablet   Oral   Take 1 tablet (15 mg total) by mouth every 4 (four) hours as needed.   20 tablet   0   . polyethylene glycol (MIRALAX / GLYCOLAX) packet   Oral   Take 17 g by mouth daily as needed (constipation).   14 each   0     BP 114/93  Pulse 102  Temp(Src) 98.7 F (37.1 C) (Oral)  Resp 16  SpO2 100%  Physical Exam  Nursing note and vitals reviewed. Constitutional: He is oriented to person, place, and time. He appears well-developed and well-nourished. No distress.  HENT:  Head: Normocephalic and atraumatic.  Mouth/Throat: Oropharynx is clear and moist.  Eyes: Conjunctivae and EOM are normal. Pupils are equal, round, and reactive to light. No scleral icterus.  Neck: Normal range of motion. Neck supple. No JVD present.  Cardiovascular: Normal rate, regular rhythm, normal heart sounds and intact distal pulses.  Exam reveals no gallop and no friction rub.   No murmur heard. Pulmonary/Chest: Effort normal and breath sounds normal. No respiratory distress. He has no wheezes. He has no rales.  Abdominal: Soft. Bowel sounds are normal. He exhibits no distension. There is no tenderness. There is no rebound and no guarding.  Musculoskeletal: He exhibits no edema.  Ecchymosis to his right arm and shoulder anterolaterally. His compartments throughout his right arm are  soft. He has full range of motion at the wrists, fingers, elbow without pain both actively and passively. He has a 2+ radial pulse which is symmetric to the opposite arm., 5/5 strength in all distal muscle groups.  Neurological: He is alert and oriented to person, place, and time. No cranial nerve deficit. He exhibits normal muscle tone. Coordination normal.  Skin: Skin is warm and dry. He is not diaphoretic.    ED Course  Procedures (including critical care time)  Labs Reviewed - No data to display Ct Abdomen Pelvis W Contrast  05/08/2013   *RADIOLOGY REPORT*  Clinical Data: Right lower quadrant abdominal pain, constipation  CT ABDOMEN AND PELVIS WITH CONTRAST  Technique:  Multidetector CT imaging of the abdomen and pelvis was performed following the standard protocol during bolus administration of intravenous contrast.  Contrast: OMNIPAQUE IOHEXOL 300 MG/ML  SOLN  Comparison: None.  Findings: Mild linear scarring in the left lower lobe.  Spleen is at the upper limits of normal for size, measuring 13.6 cm in craniocaudal dimension.  Pancreas and adrenal glands within normal limits.  Gallbladder unremarkable.  No intrahepatic or extrahepatic ductal dilatation.  Kidneys within normal limits.  No hydronephrosis.  No evidence of bowel obstruction.  Normal appendix.  Moderate colonic stool burden.  No evidence of abdominal aortic aneurysm.  No abdominopelvic ascites.  No suspicious abdominopelvic lymphadenopathy.  Prostate is unremarkable.  Bladder is within normal limits.  Visualized osseous structures are within normal limits.  IMPRESSION: Normal appendix.  No evidence of bowel obstruction.  Moderate colonic stool burden, suggesting constipation.  Borderline splenomegaly.   Original Report Authenticated By: Charline Bills, M.D.   Dg Abd Acute W/chest  05/08/2013   *RADIOLOGY REPORT*  Clinical Data: Abdominal pain and nausea.  ACUTE ABDOMEN SERIES (ABDOMEN 2 VIEW & CHEST 1 VIEW)  Comparison: None   Findings: The upright chest x-ray is normal.  Two views of the abdomen demonstrate a large amount of stool throughout the colon and down into the rectum suggesting constipation.  No distended small bowel loops to suggest obstruction.  No free air.  The soft tissue shadows are maintained. No worrisome calcifications.  The bony structures are intact.  IMPRESSION:  1.  Normal chest x-ray. 2.  Large amount of stool throughout the colon suggesting constipation.   Original Report Authenticated By: Rudie Meyer, M.D.     No diagnosis found.    MDM  32 year old male presents complaining of pain from a snake bite. The snake bite occurred 3 days ago to his right hand. He states his pain was initially well controlled left the hospital, he did not get his pain medications filled. However over the course of the last 12 hours he has developed worsening pain, 9 and 10 in severity, associated with nausea and vomiting. He's also developed some "new bruising" to his right shoulder and arm. Review of records indicates that he did not require Ativan administration during his hospitalization, he also was noted at  discharge to have ecchymosis in the same distribution that he reports is new now. The discharge summary notes that he displayed behavior concerning for narcotic seeking during his hospitalization complaining of severe pain in multiple distributions. However his workup was essentially unremarkable including labs, abdominal CT, and frequent compartment checks.  His vitals are all within normal limits, he is in no acute distress, his compartments are soft, he is followed 5 strength in all muscle groups throughout his right arm. He does have some ecchymosis over his right anterolateral shoulder and upper arm. He has a 2+ radial pulse symmetric with the opposite arm.  He has not had compartment syndrome clinically. The ecchymosis was noted to be present on his discharge summary, therefore I doubt that it represents new  coagulopathy related to the snake bite. However we'll check platelets and coags and treat his pain here in the emergency department. He would not require narcotics prescription as he does it is not had his prescription filled.  On reevaluation, his PT, INR, and platelets are all within normal limits. His PTT is modestly elevated. His pain is well controlled with a dose of IM Dilaudid and by mouth Percocet. I feel that he is stable for discharge. He is given return precautions. He is to follow with his primary care physician for reevaluation.          Toney Sang, MD 05/09/13 1724

## 2013-05-10 ENCOUNTER — Encounter (HOSPITAL_COMMUNITY): Payer: Self-pay | Admitting: *Deleted

## 2013-05-10 ENCOUNTER — Inpatient Hospital Stay (HOSPITAL_COMMUNITY)
Admission: EM | Admit: 2013-05-10 | Discharge: 2013-05-13 | DRG: 918 | Disposition: A | Discharge status: Home / Self Care | Payer: Self-pay | Attending: Internal Medicine | Admitting: Internal Medicine

## 2013-05-10 ENCOUNTER — Inpatient Hospital Stay (HOSPITAL_COMMUNITY): Payer: Self-pay

## 2013-05-10 DIAGNOSIS — I82612 Acute embolism and thrombosis of superficial veins of left upper extremity: Secondary | ICD-10-CM

## 2013-05-10 DIAGNOSIS — T6391XA Toxic effect of contact with unspecified venomous animal, accidental (unintentional), initial encounter: Principal | ICD-10-CM | POA: Diagnosis present

## 2013-05-10 DIAGNOSIS — F1920 Other psychoactive substance dependence, uncomplicated: Secondary | ICD-10-CM

## 2013-05-10 DIAGNOSIS — M79605 Pain in left leg: Secondary | ICD-10-CM

## 2013-05-10 DIAGNOSIS — K759 Inflammatory liver disease, unspecified: Secondary | ICD-10-CM

## 2013-05-10 DIAGNOSIS — M79609 Pain in unspecified limb: Secondary | ICD-10-CM

## 2013-05-10 DIAGNOSIS — M79604 Pain in right leg: Secondary | ICD-10-CM | POA: Diagnosis present

## 2013-05-10 DIAGNOSIS — G8929 Other chronic pain: Secondary | ICD-10-CM

## 2013-05-10 DIAGNOSIS — Z79899 Other long term (current) drug therapy: Secondary | ICD-10-CM

## 2013-05-10 DIAGNOSIS — K5900 Constipation, unspecified: Secondary | ICD-10-CM | POA: Diagnosis present

## 2013-05-10 DIAGNOSIS — T8069XA Other serum reaction due to other serum, initial encounter: Secondary | ICD-10-CM

## 2013-05-10 DIAGNOSIS — F17200 Nicotine dependence, unspecified, uncomplicated: Secondary | ICD-10-CM | POA: Diagnosis present

## 2013-05-10 DIAGNOSIS — M8660 Other chronic osteomyelitis, unspecified site: Secondary | ICD-10-CM | POA: Diagnosis present

## 2013-05-10 DIAGNOSIS — Z5189 Encounter for other specified aftercare: Secondary | ICD-10-CM

## 2013-05-10 DIAGNOSIS — F1120 Opioid dependence, uncomplicated: Secondary | ICD-10-CM

## 2013-05-10 DIAGNOSIS — T63001A Toxic effect of unspecified snake venom, accidental (unintentional), initial encounter: Secondary | ICD-10-CM | POA: Diagnosis present

## 2013-05-10 DIAGNOSIS — R509 Fever, unspecified: Secondary | ICD-10-CM

## 2013-05-10 DIAGNOSIS — Z720 Tobacco use: Secondary | ICD-10-CM

## 2013-05-10 LAB — COMPREHENSIVE METABOLIC PANEL
ALT: 8 U/L (ref 0–53)
AST: 13 U/L (ref 0–37)
Albumin: 3.8 g/dL (ref 3.5–5.2)
CO2: 25 mEq/L (ref 19–32)
Calcium: 9.6 mg/dL (ref 8.4–10.5)
Sodium: 137 mEq/L (ref 135–145)
Total Protein: 8.2 g/dL (ref 6.0–8.3)

## 2013-05-10 LAB — URINALYSIS, ROUTINE W REFLEX MICROSCOPIC
Bilirubin Urine: NEGATIVE
Glucose, UA: NEGATIVE mg/dL
Leukocytes, UA: NEGATIVE
Nitrite: NEGATIVE
Specific Gravity, Urine: 1.012 (ref 1.005–1.030)
pH: 7.5 (ref 5.0–8.0)

## 2013-05-10 LAB — CBC
MCH: 29.9 pg (ref 26.0–34.0)
MCHC: 35.6 g/dL (ref 30.0–36.0)
Platelets: 179 10*3/uL (ref 150–400)
RBC: 4.69 MIL/uL (ref 4.22–5.81)

## 2013-05-10 LAB — POCT I-STAT, CHEM 8
Calcium, Ion: 1.18 mmol/L (ref 1.12–1.23)
Chloride: 104 mEq/L (ref 96–112)
HCT: 41 % (ref 39.0–52.0)
TCO2: 30 mmol/L (ref 0–100)

## 2013-05-10 LAB — CG4 I-STAT (LACTIC ACID): Lactic Acid, Venous: 0.92 mmol/L (ref 0.5–2.2)

## 2013-05-10 LAB — APTT: aPTT: 52 seconds — ABNORMAL HIGH (ref 24–37)

## 2013-05-10 LAB — FIBRINOGEN: Fibrinogen: 519 mg/dL — ABNORMAL HIGH (ref 204–475)

## 2013-05-10 MED ORDER — SODIUM CHLORIDE 0.9 % IV BOLUS (SEPSIS)
1000.0000 mL | Freq: Once | INTRAVENOUS | Status: AC
  Administered 2013-05-10: 1000 mL via INTRAVENOUS

## 2013-05-10 MED ORDER — HYDROMORPHONE HCL PF 1 MG/ML IJ SOLN
1.0000 mg | INTRAMUSCULAR | Status: DC | PRN
  Administered 2013-05-10 – 2013-05-11 (×9): 2 mg via INTRAVENOUS
  Filled 2013-05-10 (×9): qty 2

## 2013-05-10 MED ORDER — VANCOMYCIN HCL 10 G IV SOLR
1250.0000 mg | Freq: Once | INTRAVENOUS | Status: AC
  Administered 2013-05-10: 1250 mg via INTRAVENOUS
  Filled 2013-05-10: qty 1250

## 2013-05-10 MED ORDER — DIPHENHYDRAMINE HCL 25 MG PO CAPS
25.0000 mg | ORAL_CAPSULE | Freq: Four times a day (QID) | ORAL | Status: DC | PRN
  Administered 2013-05-10 – 2013-05-11 (×2): 25 mg via ORAL
  Filled 2013-05-10 (×3): qty 1

## 2013-05-10 MED ORDER — HYDROMORPHONE HCL PF 1 MG/ML IJ SOLN
1.0000 mg | Freq: Once | INTRAMUSCULAR | Status: AC
  Administered 2013-05-10: 1 mg via INTRAVENOUS
  Filled 2013-05-10: qty 1

## 2013-05-10 MED ORDER — DEXTROSE 5 % IV SOLN
1.0000 g | Freq: Three times a day (TID) | INTRAVENOUS | Status: DC
  Administered 2013-05-10 – 2013-05-11 (×4): 1 g via INTRAVENOUS
  Filled 2013-05-10 (×6): qty 1

## 2013-05-10 MED ORDER — HYDROMORPHONE HCL PF 1 MG/ML IJ SOLN
1.0000 mg | INTRAMUSCULAR | Status: DC | PRN
  Administered 2013-05-10: 1 mg via INTRAVENOUS
  Filled 2013-05-10 (×2): qty 1

## 2013-05-10 MED ORDER — ONDANSETRON HCL 4 MG PO TABS: 4.0000 mg | ORAL_TABLET | Freq: Four times a day (QID) | ORAL | Status: DC | PRN

## 2013-05-10 MED ORDER — SODIUM CHLORIDE 0.9 % IJ SOLN
3.0000 mL | Freq: Two times a day (BID) | INTRAMUSCULAR | Status: DC
  Administered 2013-05-11: 3 mL via INTRAVENOUS

## 2013-05-10 MED ORDER — SODIUM CHLORIDE 0.9 % IV SOLN
INTRAVENOUS | Status: DC
  Administered 2013-05-10 – 2013-05-11 (×3): via INTRAVENOUS

## 2013-05-10 MED ORDER — ENOXAPARIN SODIUM 40 MG/0.4ML ~~LOC~~ SOLN
40.0000 mg | SUBCUTANEOUS | Status: DC
  Administered 2013-05-11 – 2013-05-13 (×3): 40 mg via SUBCUTANEOUS
  Filled 2013-05-10 (×5): qty 0.4

## 2013-05-10 MED ORDER — SODIUM CHLORIDE 0.9 % IV SOLN: INTRAVENOUS | Status: DC

## 2013-05-10 MED ORDER — NICOTINE 21 MG/24HR TD PT24
21.0000 mg | MEDICATED_PATCH | Freq: Every day | TRANSDERMAL | Status: DC
  Administered 2013-05-10 – 2013-05-13 (×4): 21 mg via TRANSDERMAL
  Filled 2013-05-10 (×4): qty 1

## 2013-05-10 MED ORDER — IOHEXOL 300 MG/ML  SOLN
100.0000 mL | Freq: Once | INTRAMUSCULAR | Status: AC | PRN
  Administered 2013-05-10: 100 mL via INTRAVENOUS

## 2013-05-10 MED ORDER — HYDROMORPHONE HCL PF 1 MG/ML IJ SOLN
1.0000 mg | Freq: Once | INTRAMUSCULAR | Status: AC
  Administered 2013-05-10: 1 mg via INTRAVENOUS

## 2013-05-10 MED ORDER — VANCOMYCIN HCL IN DEXTROSE 1-5 GM/200ML-% IV SOLN
1000.0000 mg | Freq: Three times a day (TID) | INTRAVENOUS | Status: DC
  Administered 2013-05-11: 1000 mg via INTRAVENOUS
  Filled 2013-05-10 (×5): qty 200

## 2013-05-10 MED ORDER — BISACODYL 5 MG PO TBEC: 5.0000 mg | DELAYED_RELEASE_TABLET | Freq: Every day | ORAL | Status: DC | PRN

## 2013-05-10 MED ORDER — POLYETHYLENE GLYCOL 3350 17 G PO PACK
17.0000 g | PACK | Freq: Every day | ORAL | Status: DC | PRN
  Administered 2013-05-10: 17 g via ORAL
  Filled 2013-05-10: qty 1

## 2013-05-10 MED ORDER — ONDANSETRON HCL 4 MG/2ML IJ SOLN: 4.0000 mg | Freq: Four times a day (QID) | INTRAMUSCULAR | Status: DC | PRN

## 2013-05-10 MED ORDER — ONDANSETRON HCL 4 MG/2ML IJ SOLN
4.0000 mg | Freq: Once | INTRAMUSCULAR | Status: AC
  Administered 2013-05-10: 4 mg via INTRAVENOUS
  Filled 2013-05-10: qty 2

## 2013-05-10 MED ORDER — OXYCODONE HCL 5 MG PO TABS
15.0000 mg | ORAL_TABLET | Freq: Four times a day (QID) | ORAL | Status: DC | PRN
  Administered 2013-05-10 – 2013-05-11 (×5): 15 mg via ORAL
  Filled 2013-05-10 (×6): qty 3

## 2013-05-10 NOTE — ED Provider Notes (Signed)
History     CSN: [id number]  Arrival date & time 05/10/13  0450   First MD Initiated Contact with Patient 05/10/13 0541      Chief Complaint  Patient presents with  . Animal Bite  . Leg Swelling    (Consider location/radiation/quality/duration/timing/severity/associated sxs/prior treatment) HPI HX per PT - at work 3 days ago, bit by a copper head on his R FA. He was hospitalized received crofab and discharged home with pain medications, he returned to the ER yesterday due to ecchymosis proximal left arm, had lab work and improved with narcotics and again discharged home. Today states he has fever 103.4 at home with leg and ABD cramping, persistent bruising and in general he feels worse.  History reviewed. No pertinent past medical history.  Past Surgical History  Procedure Laterality Date  . Exploratory laparotomy      History reviewed. No pertinent family history.  History  Substance Use Topics  . Smoking status: Current Every Day Smoker -- 2.00 packs/day for 17 years    Types: Cigarettes  . Smokeless tobacco: Never Used  . Alcohol Use: No      Review of Systems  Constitutional: Positive for fever and chills.  HENT: Negative for neck pain and neck stiffness.   Eyes: Negative for pain.  Respiratory: Negative for shortness of breath.   Cardiovascular: Negative for chest pain.  Gastrointestinal: Negative for vomiting and abdominal pain.  Genitourinary: Negative for dysuria.  Musculoskeletal: Negative for back pain.  Skin: Positive for wound. Negative for rash.  Neurological: Negative for headaches.  All other systems reviewed and are negative.    Allergies  Darvocet; Penicillins; Sulfa antibiotics; and Toradol  Home Medications   Current Outpatient Rx  Name  Route  Sig  Dispense  Refill  . ondansetron (ZOFRAN-ODT) 4 MG disintegrating tablet   Oral   Take 1 tablet (4 mg total) by mouth once.   20 tablet   0   . nicotine (NICODERM CQ - DOSED IN MG/24  HOURS) 21 mg/24hr patch   Transdermal   Place 1 patch onto the skin daily.   28 patch   0   . oxyCODONE (ROXICODONE) 15 MG immediate release tablet   Oral   Take 1 tablet (15 mg total) by mouth every 4 (four) hours as needed.   20 tablet   0   . polyethylene glycol (MIRALAX / GLYCOLAX) packet   Oral   Take 17 g by mouth daily as needed (constipation).   14 each   0     BP 124/83  Pulse 90  Temp(Src) 97.9 F (36.6 C) (Oral)  Resp 16  SpO2 100%  Physical Exam  Constitutional: He is oriented to person, place, and time. He appears well-developed and well-nourished.  HENT:  Head: Normocephalic and atraumatic.  Mouth/Throat: Oropharynx is clear and moist. No oropharyngeal exudate.  Eyes: EOM are normal. Pupils are equal, round, and reactive to light.  Neck: Neck supple.  Cardiovascular: Regular rhythm and intact distal pulses.   Pulmonary/Chest: Effort normal. No respiratory distress.  Abdominal: Soft. Bowel sounds are normal. He exhibits no distension. There is no tenderness. There is no rebound and no guarding.  Musculoskeletal:  LUE: soft compartments, ecchymosis over proximal arm, puncture wounds L FA, no erythema, warmth, or edema, distal N/V intact  Neurological: He is alert and oriented to person, place, and time.  Skin: Skin is warm and dry.    ED Course  Procedures (including critical care time)  Labs  Reviewed  CBC  COMPREHENSIVE METABOLIC PANEL  PROTIME-INR  APTT   Ct Abdomen Pelvis W Contrast  05/08/2013   *RADIOLOGY REPORT*  Clinical Data: Right lower quadrant abdominal pain, constipation  CT ABDOMEN AND PELVIS WITH CONTRAST  Technique:  Multidetector CT imaging of the abdomen and pelvis was performed following the standard protocol during bolus administration of intravenous contrast.  Contrast: OMNIPAQUE IOHEXOL 300 MG/ML  SOLN  Comparison: None.  Findings: Mild linear scarring in the left lower lobe.  Spleen is at the upper limits of normal for size,  measuring 13.6 cm in craniocaudal dimension.  Pancreas and adrenal glands within normal limits.  Gallbladder unremarkable.  No intrahepatic or extrahepatic ductal dilatation.  Kidneys within normal limits.  No hydronephrosis.  No evidence of bowel obstruction.  Normal appendix.  Moderate colonic stool burden.  No evidence of abdominal aortic aneurysm.  No abdominopelvic ascites.  No suspicious abdominopelvic lymphadenopathy.  Prostate is unremarkable.  Bladder is within normal limits.  Visualized osseous structures are within normal limits.  IMPRESSION: Normal appendix.  No evidence of bowel obstruction.  Moderate colonic stool burden, suggesting constipation.  Borderline splenomegaly.   Original Report Authenticated By: Charline Bills, M.D.   Dg Abd Acute W/chest  05/08/2013   *RADIOLOGY REPORT*  Clinical Data: Abdominal pain and nausea.  ACUTE ABDOMEN SERIES (ABDOMEN 2 VIEW & CHEST 1 VIEW)  Comparison: None  Findings: The upright chest x-ray is normal.  Two views of the abdomen demonstrate a large amount of stool throughout the colon and down into the rectum suggesting constipation.  No distended small bowel loops to suggest obstruction.  No free air.  The soft tissue shadows are maintained. No worrisome calcifications.  The bony structures are intact.  IMPRESSION:  1.  Normal chest x-ray. 2.  Large amount of stool throughout the colon suggesting constipation.   Original Report Authenticated By: Rudie Meyer, M.D.    IVFs, IV Dilaudid, labs  Results for orders placed during the hospital encounter of 05/10/13  CBC      Result Value Range   WBC 7.3  4.0 - 10.5 K/uL   RBC 4.69  4.22 - 5.81 MIL/uL   Hemoglobin 14.0  13.0 - 17.0 g/dL   HCT 16.1  09.6 - 04.5 %   MCV 83.8  78.0 - 100.0 fL   MCH 29.9  26.0 - 34.0 pg   MCHC 35.6  30.0 - 36.0 g/dL   RDW 40.9  81.1 - 91.4 %   Platelets 179  150 - 400 K/uL  PROTIME-INR      Result Value Range   Prothrombin Time 14.3  11.6 - 15.2 seconds   INR 1.13   0.00 - 1.49  APTT      Result Value Range   aPTT 52 (*) 24 - 37 seconds  CG4 I-STAT (LACTIC ACID)      Result Value Range   Lactic Acid, Venous 0.92  0.5 - 2.2 mmol/L  POCT I-STAT, CHEM 8      Result Value Range   Sodium 139  135 - 145 mEq/L   Potassium 4.1  3.5 - 5.1 mEq/L   Chloride 104  96 - 112 mEq/L   BUN 14  6 - 23 mg/dL   Creatinine, Ser 7.82  0.50 - 1.35 mg/dL   Glucose, Bld 956 (*) 70 - 99 mg/dL   Calcium, Ion 2.13  0.86 - 1.23 mmol/L   TCO2 30  0 - 100 mmol/L   Hemoglobin 13.9  13.0 - 17.0 g/dL   HCT 78.2  95.6 - 21.3 %   Dg Elbow 2 Views Right  05/04/2013   *RADIOLOGY REPORT*  Clinical Data: Right elbow pain.  RIGHT ELBOW - 2 VIEW  Comparison: Earlier films, same date.  Findings: The joint spaces are maintained.  No acute fracture or joint effusion.  Stable small radiopaque foreign body noted in the ulnar sided soft tissues.  IMPRESSION: No acute bony findings or joint effusion. Stable small radiopaque foreign body.   Original Report Authenticated By: Rudie Meyer, M.D.   Dg Elbow 2 Views Right  05/04/2013   *RADIOLOGY REPORT*  Clinical Data: Fall, right elbow pain  RIGHT ELBOW - 2 VIEW  Comparison: 01/15/2010  Findings: No fracture or dislocation is seen.  5 mm radiopaque foreign body in the medial/ventral soft tissues of the proximal forearm.  The presence of an elbow joint effusion cannot be assessed due to difficulty with patient positioning.  IMPRESSION: No fracture or dislocation is seen.  5 mm radiopaque foreign body along the medial aspect of the proximal forearm.   Original Report Authenticated By: Charline Bills, M.D.   Ct Abdomen Pelvis W Contrast  05/08/2013   *RADIOLOGY REPORT*  Clinical Data: Right lower quadrant abdominal pain, constipation  CT ABDOMEN AND PELVIS WITH CONTRAST  Technique:  Multidetector CT imaging of the abdomen and pelvis was performed following the standard protocol during bolus administration of intravenous contrast.  Contrast:  OMNIPAQUE IOHEXOL 300 MG/ML  SOLN  Comparison: None.  Findings: Mild linear scarring in the left lower lobe.  Spleen is at the upper limits of normal for size, measuring 13.6 cm in craniocaudal dimension.  Pancreas and adrenal glands within normal limits.  Gallbladder unremarkable.  No intrahepatic or extrahepatic ductal dilatation.  Kidneys within normal limits.  No hydronephrosis.  No evidence of bowel obstruction.  Normal appendix.  Moderate colonic stool burden.  No evidence of abdominal aortic aneurysm.  No abdominopelvic ascites.  No suspicious abdominopelvic lymphadenopathy.  Prostate is unremarkable.  Bladder is within normal limits.  Visualized osseous structures are within normal limits.  IMPRESSION: Normal appendix.  No evidence of bowel obstruction.  Moderate colonic stool burden, suggesting constipation.  Borderline splenomegaly.   Original Report Authenticated By: Charline Bills, M.D.   Dg Abd Acute W/chest  05/08/2013   *RADIOLOGY REPORT*  Clinical Data: Abdominal pain and nausea.  ACUTE ABDOMEN SERIES (ABDOMEN 2 VIEW & CHEST 1 VIEW)  Comparison: None  Findings: The upright chest x-ray is normal.  Two views of the abdomen demonstrate a large amount of stool throughout the colon and down into the rectum suggesting constipation.  No distended small bowel loops to suggest obstruction.  No free air.  The soft tissue shadows are maintained. No worrisome calcifications.  The bony structures are intact.  IMPRESSION:  1.  Normal chest x-ray. 2.  Large amount of stool throughout the colon suggesting constipation.   Original Report Authenticated By: Rudie Meyer, M.D.   Less than 24 hour repeat visit for snake bote, cramping, bruising now fever to 103.4, still having moderate to severe  MDM  Snake bite s/p crofab 4 days ago, now bruising ABD and leg cramping and fever  Labs, IVfs, IV narcotics  MED admit         Sunnie Nielsen, MD 05/10/13 [phone]

## 2013-05-10 NOTE — Progress Notes (Signed)
Rn was called to room for patient's telemetry box was alarming.Currently centralized monitoring is down for maintenance.  On the monitor itself it said V. Tach. The rhythm on the monitor was irregular and looked like artifact. Pt was alert and oriented and had just gotten up to go to the bathroom. Rn asked if he felt that his heart was racing and he stated "no, I'm just in pain". Rn auscultated and patient's heart rate was 78 apical. Rn will continue to monitor patient for any other issues.

## 2013-05-10 NOTE — Progress Notes (Signed)
[facility] hospitalist notified that pt has a area on the left forearm that is red, warm, hard and painful new orders received and will continue to monitor. Ilean Skill LPN

## 2013-05-10 NOTE — Progress Notes (Signed)
ANTIBIOTIC CONSULT NOTE - INITIAL  Pharmacy Consult:  Vancomycin / Zosyn Indication:  Myonecrosis   Allergies  Allergen Reactions  . Darvocet (Propoxyphene-Acetaminophen) Anaphylaxis  . Penicillins Anaphylaxis and Hives  . Sulfa Antibiotics Anaphylaxis  . Toradol (Ketorolac Tromethamine) Anaphylaxis    Patient Measurements: Height: 5' 8.9" (175 cm) Weight: 163 lb 5.8 oz (74.1 kg) IBW/kg (Calculated) : 70.47 Vital Signs: Temp: 97.9 F (36.6 C) (05/28 0454) Temp src: Oral (05/28 0454) BP: 114/71 mmHg (05/28 0813) Pulse Rate: 66 (05/28 0813)  Labs:  Recent Labs  05/08/13 0500 05/09/13 0944 05/10/13 0557 05/10/13 0613  WBC 8.7  --  7.3  --   HGB 13.4  --  14.0 13.9  PLT 187 172 179  --   CREATININE 0.71  --  0.70 0.70   Estimated Creatinine Clearance: 132.2 ml/min (by C-G formula based on Cr of 0.7). No results found for this basename: VANCOTROUGH, VANCOPEAK, VANCORANDOM, GENTTROUGH, GENTPEAK, GENTRANDOM, TOBRATROUGH, TOBRAPEAK, TOBRARND, AMIKACINPEAK, AMIKACINTROU, AMIKACIN,  in the last 72 hours   Microbiology: No results found for this or any previous visit (from the past 720 hour(s)).  Medical History: History reviewed. No pertinent past medical history.      Assessment: 30 YOM s/p snake bite on right hand on 05/07/13 and was treated with Crofab and then sent home.  He returns today 05/10/13 due to pain and new bruising to his right shoulder and arm.  He also complains of leg cramping.  Pharmacy consulted to manage vancomycin and Azactam.  His renal remains stable.  Vanc 5/28 >> Azactam 5/28 >>   Goal of Therapy:  Vanc trough ~15 mcg/mL   Plan:  - Vanc 1250mg  IV x 1 now, then 1gm IV Q8H - Azactam 1gm IV Q8H - Monitor renal fxn, clinical course, vanc trough at [facility]     Tamryn Popko D. Laney Potash, PharmD, BCPS Pager:  [phone] 05/10/2013, 11:04 AM

## 2013-05-10 NOTE — Progress Notes (Signed)
Pharmacy notified that pt had itching with the Vancomycin and is refusing medication. Ilean Skill LPN

## 2013-05-10 NOTE — Progress Notes (Signed)
Pt refused full infusion of IV Vancomycin d/t extreme itching.  Stopped infusion, administered PO Benadryl.  Itching has subsided, and pt resting.  Will continue to monitor.

## 2013-05-10 NOTE — ED Notes (Signed)
PT states that he was seen and treated for a snake bite that was on his right forearm. Pt states that yesterday he came back to be checked for the bruising on his right upper arm that has increased since his last visit. Pt states taht his joints are hurting. Pt states that his legs are swelling and his ankle hurt.

## 2013-05-10 NOTE — H&P (Signed)
[facility] History and Physical  Carl Valencia JXB:[id number] DOB: 12-29-1980 DOA: 05/10/2013  Referring physician: Dr. Dierdre Highman PCP: No PCP Per Patient  Specialists: none  Chief Complaint: fever, Spread of right arm bruising to shoulder area, and leg pains  HPI: Carl Valencia is a 32 y.o. male with history of chronic back pain, narcotics dependence on oxycodone IR 15 mg 3 times a day, history of chronic osteomyelitis, recently admitted to hospital on 5/25 with a snake bite treated with crofab and discharged the next day, who presents with above complaints. He states that since his discharge he came back to the ED yesterday 5/27 with complaints of extension of bruising to his right shoulder area and was sent home after workup was otherwise unremarkable. Overnight he states he had a fever to 103.4, and began having bilateral leg cramping, as well as lower abdominal cramping and so he came back to the ED. He denies shortness of breath, cough, dysuria, diarrhea, melena and no hematochezia. He is admitted for further evaluation and management.   Review of Systems: The patient denies anorexia, weight loss,, vision loss, decreased hearing, hoarseness, chest pain, syncope, dyspnea on exertion, peripheral edema, balance deficits, hemoptysis, abdominal pain, melena, hematochezia, severe indigestion/heartburn, hematuria, incontinence,muscle weakness, transient blindness, difficulty walking, depression, unusual weight change, abnormal bleeding.  History reviewed. No pertinent past medical history. Past Surgical History  Procedure Laterality Date  . Exploratory laparotomy     Social History:  reports that he has been smoking Cigarettes.  He has a 34 pack-year smoking history. He has never used smokeless tobacco. He reports that he does not drink alcohol or use illicit drugs. where does patient live--home Can patient participate in ADLs-yes  Allergies  Allergen Reactions  . Darvocet  (Propoxyphene-Acetaminophen) Anaphylaxis  . Penicillins Anaphylaxis and Hives  . Sulfa Antibiotics Anaphylaxis  . Toradol (Ketorolac Tromethamine) Anaphylaxis    family history: Mother with diabetes, hypertension and heart   Prior to Admission medications   Medication Sig Start Date End Date Taking? Authorizing Provider  ondansetron (ZOFRAN-ODT) 4 MG disintegrating tablet Take 1 tablet (4 mg total) by mouth once. 05/09/13  Yes Toney Sang, MD  nicotine (NICODERM CQ - DOSED IN MG/24 HOURS) 21 mg/24hr patch Place 1 patch onto the skin daily. 05/08/13   Leroy Sea, MD  oxyCODONE (ROXICODONE) 15 MG immediate release tablet Take 1 tablet (15 mg total) by mouth every 4 (four) hours as needed. 05/08/13   Leroy Sea, MD  polyethylene glycol (MIRALAX / GLYCOLAX) packet Take 17 g by mouth daily as needed (constipation). 05/08/13   Leroy Sea, MD   Physical Exam: Filed Vitals:   05/10/13 0500 05/10/13 0545 05/10/13 0630 05/10/13 0813  BP: 125/70 124/70 114/62 114/71  Pulse: 91 80 66 66  Temp:      TempSrc:      Resp:    18  SpO2: 99% 99% 99% 100%    Constitutional: Vital signs reviewed.  Patient is a well-developed and well-nourished in no acute distress and cooperative with exam. Alert and oriented x3.  Head: Normocephalic and atraumatic Nose: No erythema or drainage noted.  Turbinates normal Mouth: no erythema or exudates, MMM Eyes: PERRL, EOMI, conjunctivae normal, No scleral icterus.  Neck: Supple, Trachea midline normal ROM, No JVD, mass, thyromegaly, or carotid bruit present.  Cardiovascular: RRR, S1 normal, S2 normal, no MRG, pulses symmetric and intact bilaterally Pulmonary/Chest: normal respiratory effort, CTAB, no wheezes, rales, or rhonchi Abdominal: Soft. Mild lower abdominal tenderness, no rebound  non-distended, bowel sounds are normal, no masses, organomegaly, or guarding present.  GU: no CVA tenderness Extremities: Right upper extremity with ecchymosis over her  shoulders/deltoid area with mild tenderness-upper arm from wrist area with resolving ecchymosis, no edema, no area of fluctuance. Left upper extremity and bilateral lower extremity with no erythema, no cyanosis,  nontender and no edema.  Neurological: A&O x3, Strength is normal and symmetric bilaterally, cranial nerve II-XII are grossly intact, no focal motor deficit, sensory intact to light touch bilaterally.  Psychiatric: Normal mood and affect. speech and behavior is normal. Judgment and thought content normal. Cognition and memory are normal.    Labs on Admission:  Basic Metabolic Panel:  Recent Labs Lab 05/06/13 1700 05/06/13 2225 05/08/13 0500 05/10/13 0557 05/10/13 0613  NA 141 142 138 137 139  K 3.4* 3.6 3.7 4.1 4.1  CL 105 109 100 99 104  CO2 24 24 24 25   --   GLUCOSE 95 89 92 107* 104*  BUN 10 11 10 14 14   CREATININE 0.90 0.80 0.71 0.70 0.70  CALCIUM 9.7 8.9 9.7 9.6  --    Liver Function Tests:  Recent Labs Lab 05/10/13 0557  AST 13  ALT 8  ALKPHOS 88  BILITOT 1.9*  PROT 8.2  ALBUMIN 3.8   No results found for this basename: LIPASE, AMYLASE,  in the last 168 hours No results found for this basename: AMMONIA,  in the last 168 hours CBC:  Recent Labs Lab 05/06/13 1700 05/06/13 2225 05/08/13 0500 05/09/13 0944 05/10/13 0557 05/10/13 0613  WBC 8.1 8.0 8.7  --  7.3  --   NEUTROABS 3.6 4.3  --   --   --   --   HGB 13.3 12.0* 13.4  --  14.0 13.9  HCT 37.5* 34.1* 36.9*  --  39.3 41.0  MCV 85.6 86.1 83.5  --  83.8  --   PLT 224 203 187 172 179  --    Cardiac Enzymes: No results found for this basename: CKTOTAL, CKMB, CKMBINDEX, TROPONINI,  in the last 168 hours  BNP (last 3 results) No results found for this basename: PROBNP,  in the last 8760 hours CBG: No results found for this basename: GLUCAP,  in the last 168 hours  Radiological Exams on Admission: Ct Abdomen Pelvis W Contrast  05/08/2013   *RADIOLOGY REPORT*  Clinical Data: Right lower quadrant  abdominal pain, constipation  CT ABDOMEN AND PELVIS WITH CONTRAST  Technique:  Multidetector CT imaging of the abdomen and pelvis was performed following the standard protocol during bolus administration of intravenous contrast.  Contrast: OMNIPAQUE IOHEXOL 300 MG/ML  SOLN  Comparison: None.  Findings: Mild linear scarring in the left lower lobe.  Spleen is at the upper limits of normal for size, measuring 13.6 cm in craniocaudal dimension.  Pancreas and adrenal glands within normal limits.  Gallbladder unremarkable.  No intrahepatic or extrahepatic ductal dilatation.  Kidneys within normal limits.  No hydronephrosis.  No evidence of bowel obstruction.  Normal appendix.  Moderate colonic stool burden.  No evidence of abdominal aortic aneurysm.  No abdominopelvic ascites.  No suspicious abdominopelvic lymphadenopathy.  Prostate is unremarkable.  Bladder is within normal limits.  Visualized osseous structures are within normal limits.  IMPRESSION: Normal appendix.  No evidence of bowel obstruction.  Moderate colonic stool burden, suggesting constipation.  Borderline splenomegaly.   Original Report Authenticated By: Charline Bills, M.D.   Dg Abd Acute W/chest  05/08/2013   *RADIOLOGY REPORT*  Clinical  Data: Abdominal pain and nausea.  ACUTE ABDOMEN SERIES (ABDOMEN 2 VIEW & CHEST 1 VIEW)  Comparison: None  Findings: The upright chest x-ray is normal.  Two views of the abdomen demonstrate a large amount of stool throughout the colon and down into the rectum suggesting constipation.  No distended small bowel loops to suggest obstruction.  No free air.  The soft tissue shadows are maintained. No worrisome calcifications.  The bony structures are intact.  IMPRESSION:  1.  Normal chest x-ray. 2.  Large amount of stool throughout the colon suggesting constipation.   Original Report Authenticated By: Rudie Meyer, M.D.      Assessment/Plan Active Problems:   Fever -As discussed above in patient with recent  snake bite and status post crofab, now presenting with reported fever (none documented so far in ED), extension of ecchymosis to shoulder area noted on 5/27 -Discussed/curbsided the CCM and concern is for possible myonecrosis or fat necrosis in that shoulder area -His lactic acid level is within normal limits and 0.92, platelet count remains within normal limits and PTT is still elevated and about the same as on 5/27 and fibrinogen level is still pending at this time -Will obtain blood and urine cultures, CT scan of right shoulder to evaluate for possible myonecrosis or abscess, and start empiric broad-spectrum antibiotics and follow   Snake bite poisoning -As discussed above, status post CroFab on 5/25   Chronic narcotic dependence -Continue pain management   Tobacco abuse -Continue nicotine patch   Leg pain, bilateral -Possibly secondary to #2, follow and further evaluate as clinically appropriate. History of constipation -Continue MiraLAX, add Dulcolax when necessary    Code Status: full Family Communication: directly with pt at bedside Disposition Plan: admit to tele  Time spent: >62mins  Kela Millin [facility] Pager [phone]  If 7PM-7AM, please contact night-coverage [email or web address] Password [facility] 05/10/2013, 9:07 AM

## 2013-05-10 NOTE — ED Provider Notes (Signed)
I saw and evaluated the patient, reviewed the resident's note and I agree with the findings and plan. The patient presents for evaluation of bruising to the right upper arm.  He was apparently bitten by a copperhead on the right forearm three days prior and was seen at [facility] for this.  He was given crofab and improved, then discharged.  He said he had this bruising when he was here before but had cleared up prior to discharge.  It has now returned.  There was no new injury or trauma.  No other complaints.   On exam, the patient is afebrile and the vitals are stable.  The right anterior bicep area and anterior deltoid area is noted to have an ecchymotic area present.  There is no appreciable swelling and no tenderness or discomfort to the area.  There is no redness or warmth.  The distal ulnar and radial pulses are easily palpable.  The punctures to the right forearm appear well without swelling or erythema.    The patient presents with continued bruising after a snake bite.  The arm appears well with no evidence for compartment syndrome.  The labs are not reflective of DIC but there is an elevation of the ptt.  I am unsure of the significance of this.  He appears well and I believe is appropriate for discharge with no further treatment or intervention.  Geoffery Lyons, MD 05/10/13 [phone]

## 2013-05-11 DIAGNOSIS — M79609 Pain in unspecified limb: Secondary | ICD-10-CM

## 2013-05-11 DIAGNOSIS — M7989 Other specified soft tissue disorders: Secondary | ICD-10-CM

## 2013-05-11 DIAGNOSIS — I82619 Acute embolism and thrombosis of superficial veins of unspecified upper extremity: Secondary | ICD-10-CM

## 2013-05-11 LAB — PROTIME-INR
INR: 0.99 (ref 0.00–1.49)
Prothrombin Time: 13 seconds (ref 11.6–15.2)

## 2013-05-11 LAB — CBC
HCT: 35.6 % — ABNORMAL LOW (ref 39.0–52.0)
Hemoglobin: 12.6 g/dL — ABNORMAL LOW (ref 13.0–17.0)
RBC: 4.19 MIL/uL — ABNORMAL LOW (ref 4.22–5.81)

## 2013-05-11 LAB — URINE CULTURE

## 2013-05-11 LAB — D-DIMER, QUANTITATIVE: D-Dimer, Quant: 1.33 ug/mL-FEU — ABNORMAL HIGH (ref 0.00–0.48)

## 2013-05-11 MED ORDER — NAPROXEN 500 MG PO TABS
500.0000 mg | ORAL_TABLET | Freq: Two times a day (BID) | ORAL | Status: DC
  Administered 2013-05-11 – 2013-05-13 (×4): 500 mg via ORAL
  Filled 2013-05-11 (×6): qty 1

## 2013-05-11 MED ORDER — OXYCODONE HCL 5 MG PO TABS
15.0000 mg | ORAL_TABLET | ORAL | Status: DC | PRN
  Administered 2013-05-11 – 2013-05-13 (×11): 15 mg via ORAL
  Filled 2013-05-11 (×10): qty 3

## 2013-05-11 NOTE — Progress Notes (Signed)
[facility] notified of pt D Dimer lab result 1.33 awaiting response. Ilean Skill LPN

## 2013-05-11 NOTE — Progress Notes (Signed)
Site at IV is slightly swollen, pink/red , warm to touch, pt c/o itching, Benadryl given at this time, Dr. Suanne Marker notified

## 2013-05-11 NOTE — Progress Notes (Signed)
[facility] PROGRESS NOTE  Carl Valencia ZOX:[id number] DOB: [birth year] DOA: 05/10/2013 PCP: No PCP Per Patient  Assessment/Plan:  Fever/Probable Serum sickness  -As discussed above in patient with recent snake bite and status post crofab, now presenting with reported fever (none documented so far in ED), extension of ecchymosis to shoulder area noted on 5/27, and arthralgias  -CT shoulder neg (no myonecrosis or fat necrosis in that shoulder area) -His lactic acid level is within normal limits and 0.92, platelet count remains within normal limits and PTT is still elevated and about the same as on 5/27 and fibrinogen level is elevated c/w acute phase reactant, but o/w plts nl, PTT trending down, blood and urine cultures so far neg -will d/c empiric abx -place on NSAIDs, supportive care and follow Snake bite poisoning  -As discussed above, status post CroFab on 5/25  Chronic narcotic dependence  -Continue pain management  -will d/c dilaudid and start NSAIDs as above, he has tolerated naproxen in the past Tobacco abuse  -Continue nicotine patch  Leg pain, bilateral  -Possibly secondary to serum sickness, follow -pain management as above History of constipation  -Continue MiraLAX prn, Dulcolax when necessary RUE with superficial venous thrombosis- at old iv site -warm compresses, NSAIDs -discussed with vascular on call and he agrees with above treatment BLE sluggish low/eminent DVT -discussed with vascular-phone consult, and positioning pt to maximize flow recommended, and the [place] to be repeated in 1week oupt     Code Status: full Family Communication:directly with pt at bedside  Disposition Plan: to home when stable   Consultants:     Procedures:  none  Antibiotics:  vanc and azactam 5/28>>5/29  HPI/Subjective: Swelling in RUE old iv site last pm, still with c/o leg/joint pain- worse with mov't  Objective: Filed Vitals:   05/10/13 1839 05/10/13 2300 05/11/13  0520 05/11/13 1400  BP: 125/68 133/84 120/78 122/65  Pulse: 87 90 85 72  Temp: 97.8 F (36.6 C) 98.7 F (37.1 C) 98.1 F (36.7 C) 98.8 F (37.1 C)  TempSrc: Axillary Oral  Oral  Resp: 18 18 18 16   Height:      Weight:      SpO2: 99% 100% 100% 99%    Intake/Output Summary (Last 24 hours) at 05/11/13 1852 Last data filed at 05/11/13 8119  Gross per 24 hour  Intake 2241.67 ml  Output   1250 ml  Net 991.67 ml   Filed Weights   05/10/13 0900  Weight: 74.1 kg (163 lb 5.8 oz)    Exam:   General:  Alert and oriented x3  Cardiovascular: RRR  Respiratory: CTAB  Abdomen: soft +bs NT/ND  Extremities: no further extension of RUE bruising/ecchymosis, no edema, no warmth or induration. LUE forearm with palpable cord, mild tenderness, no erythema    Data Reviewed: Basic Metabolic Panel:  Recent Labs Lab 05/06/13 1700 05/06/13 2225 05/08/13 0500 05/10/13 0557 05/10/13 0613  NA 141 142 138 137 139  K 3.4* 3.6 3.7 4.1 4.1  CL 105 109 100 99 104  CO2 24 24 24 25   --   GLUCOSE 95 89 92 107* 104*  BUN 10 11 10 14 14   CREATININE 0.90 0.80 0.71 0.70 0.70  CALCIUM 9.7 8.9 9.7 9.6  --    Liver Function Tests:  Recent Labs Lab 05/10/13 0557  AST 13  ALT 8  ALKPHOS 88  BILITOT 1.9*  PROT 8.2  ALBUMIN 3.8   No results found for this basename: LIPASE, AMYLASE,  in the  last 168 hours No results found for this basename: AMMONIA,  in the last 168 hours CBC:  Recent Labs Lab 05/06/13 1700 05/06/13 2225 05/08/13 0500 05/09/13 0944 05/10/13 0557 05/10/13 0613 05/11/13 0625  WBC 8.1 8.0 8.7  --  7.3  --  5.9  NEUTROABS 3.6 4.3  --   --   --   --   --   HGB 13.3 12.0* 13.4  --  14.0 13.9 12.6*  HCT 37.5* 34.1* 36.9*  --  39.3 41.0 35.6*  MCV 85.6 86.1 83.5  --  83.8  --  85.0  PLT 224 203 187 172 179  --  174   Cardiac Enzymes: No results found for this basename: CKTOTAL, CKMB, CKMBINDEX, TROPONINI,  in the last 168 hours BNP (last 3 results) No results found  for this basename: PROBNP,  in the last 8760 hours CBG: No results found for this basename: GLUCAP,  in the last 168 hours  Recent Results (from the past 240 hour(s))  URINE CULTURE     Status: None   Collection Time    05/10/13 11:00 AM      Result Value Range Status   Specimen Description URINE, RANDOM   Final   Special Requests NONE   Final   Culture  Setup Time 05/10/2013 12:32   Final   Colony Count NO GROWTH   Final   Culture NO GROWTH   Final   Report Status 05/11/2013 FINAL   Final     Studies: Ct Shoulder Right W Contrast  05/11/2013   *RADIOLOGY REPORT*  Clinical Data: Status post snake bite.  Fever and bruising.  CT OF THE RIGHT SHOULDER WITH CONTRAST  Technique:  Multidetector CT imaging was performed following the standard protocol during bolus administration of intravenous contrast.  Contrast: OMNIPAQUE IOHEXOL 300 MG/ML  SOLN  Comparison: None.  Findings: No abscess is identified.  All visualized musculature is normal in appearance without mass or fluid collection.  No bony destructive change is seen.  There is no shoulder joint effusion. Imaged right lung is clear.  IMPRESSION: Normal study.   Original Report Authenticated By: Holley Dexter, M.D.    Scheduled Meds: . enoxaparin (LOVENOX) injection  40 mg Subcutaneous Q24H  . naproxen  500 mg Oral BID WC  . nicotine  21 mg Transdermal Daily  . sodium chloride  3 mL Intravenous Q12H   Continuous Infusions: . sodium chloride 100 mL/hr at 05/11/13 1156    Active Problems:   Snake bite poisoning   Chronic narcotic dependence   Tobacco abuse   Leg pain, bilateral   Fever    Time spent: >60mins    [facility]  [facility] Pager [phone]. If 7PM-7AM, please contact night-coverage at [email or web address], password [facility] 05/11/2013, 6:52 PM  LOS: 1 day

## 2013-05-11 NOTE — Progress Notes (Signed)
Event: Notified by RN that pt c/o swelling, redness and pain to (L) upper arm. Pt had an IV in the [facility] space of the (L) arm during his previous admission a couple of days ago.  Subjective: Pt reports noticing increasing redness, swelling and pain upper (L) FA today. He related to RN that the redness starts where his IV was during his most recent hospitalization for a snakebite. He denies IV drug use. RN instructed to begin intermittent warm soaks. NP to bedside.  Objective: Mr. Lofgren is a 32 y/o male with h/o chronic back pain, narcotics dependence on oxycodone IR 15 mg 3 times a day, h/o chronic osteomyelitis, recently admitted to hospital on 5/25 with a snake bite treated with crofab and discharged the next day, who presented to ED 05/10/13 with above complaints. He states that since his discharge he came back to the ED 5/27 with c/o extension of bruising to his right shoulder area and was sent home after workup was otherwise unremarkable. Overnight he states he had a fever to 103.4, and began having bilateral leg cramping, as well as lower abdominal cramping and so he came back to the ED. He denied shortness of breath, cough, dysuria, diarrhea, melena and no hematochezia. He was admitted for further evaluation and management. At bedside pt noted resting in NAD. Area to the inner lateral LUE noted to be erythematous, slightly swollen,  warm and TTP. Erythema starts at the inner anterolateral AC space and extends upward approx 8-10cm. Palpable firmness (?cords) from same location at [facility] upward approx 4-6cm.D-Dimer 1.33. Assessment/Plan: 1. Erythematous, painful  LUE: LUE DVT vs superficial thrombophlebitis vs abscess. D-Dimer positive though unclear how informative this is given pt's status as post recent snakebite. Will defer Lovenox as well. Order placed for LUE dopplers in am. Will continue to monitor closely.  Leanne Chang, NP-C [facility] Pager [phone]

## 2013-05-11 NOTE — Progress Notes (Signed)
VASCULAR LAB PRELIMINARY  PRELIMINARY  PRELIMINARY  PRELIMINARY  Left upper extremity and bilateral lower extremity venous Dopplers completed.    Preliminary report:  There is superficial thrombosis noted in the left basilic vein from the [facility] extending to 3 inches before the axilla in the left upper extremity.                                     There is no DVT noted in the bilateral lower extremities.  However, there is significant sluggish flow noted in the bilateral popliteal and posterior tibial veins which could suggest eminent formation of DVT.    Carl Valencia, RVT 05/11/2013, 11:25 AM

## 2013-05-12 DIAGNOSIS — F17200 Nicotine dependence, unspecified, uncomplicated: Secondary | ICD-10-CM

## 2013-05-12 DIAGNOSIS — T8069XA Other serum reaction due to other serum, initial encounter: Secondary | ICD-10-CM

## 2013-05-12 LAB — BASIC METABOLIC PANEL
Calcium: 9.4 mg/dL (ref 8.4–10.5)
Creatinine, Ser: 0.58 mg/dL (ref 0.50–1.35)
GFR calc Af Amer: >90 mL/min (ref >90)
GFR calc non Af Amer: >90 mL/min (ref >90)

## 2013-05-12 LAB — APTT: aPTT: 43 seconds — ABNORMAL HIGH (ref 24–37)

## 2013-05-12 MED ORDER — NAPROXEN 500 MG PO TABS: 500.0000 mg | ORAL_TABLET | Freq: Two times a day (BID) | ORAL | Status: DC

## 2013-05-12 MED ORDER — DIPHENHYDRAMINE HCL 25 MG PO CAPS: 25.0000 mg | ORAL_CAPSULE | Freq: Four times a day (QID) | ORAL | Status: DC | PRN

## 2013-05-12 MED ORDER — PANTOPRAZOLE SODIUM 40 MG PO TBEC: 40.0000 mg | DELAYED_RELEASE_TABLET | Freq: Every day | ORAL | Status: DC
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
## 2013-05-12 NOTE — Progress Notes (Signed)
Pt refused to have IVF running so RN just SNL pt.
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
## 2013-05-14 ENCOUNTER — Encounter (HOSPITAL_COMMUNITY): Payer: Self-pay | Admitting: Emergency Medicine

## 2013-05-14 ENCOUNTER — Emergency Department (HOSPITAL_COMMUNITY): Payer: Self-pay

## 2013-05-14 ENCOUNTER — Inpatient Hospital Stay (HOSPITAL_COMMUNITY)
Admission: EM | Admit: 2013-05-14 | Discharge: 2013-05-28 | DRG: 541 | Disposition: A | Discharge status: Other Acute Inpatient Hospital | Attending: Internal Medicine | Admitting: Internal Medicine

## 2013-05-14 DIAGNOSIS — R509 Fever, unspecified: Secondary | ICD-10-CM

## 2013-05-14 DIAGNOSIS — F17200 Nicotine dependence, unspecified, uncomplicated: Secondary | ICD-10-CM | POA: Diagnosis present

## 2013-05-14 DIAGNOSIS — M5126 Other intervertebral disc displacement, lumbar region: Secondary | ICD-10-CM | POA: Diagnosis present

## 2013-05-14 DIAGNOSIS — Z720 Tobacco use: Secondary | ICD-10-CM

## 2013-05-14 DIAGNOSIS — K759 Inflammatory liver disease, unspecified: Secondary | ICD-10-CM

## 2013-05-14 DIAGNOSIS — Z5189 Encounter for other specified aftercare: Secondary | ICD-10-CM

## 2013-05-14 DIAGNOSIS — M869 Osteomyelitis, unspecified: Principal | ICD-10-CM | POA: Diagnosis present

## 2013-05-14 DIAGNOSIS — M549 Dorsalgia, unspecified: Secondary | ICD-10-CM

## 2013-05-14 DIAGNOSIS — G8929 Other chronic pain: Secondary | ICD-10-CM | POA: Diagnosis present

## 2013-05-14 DIAGNOSIS — F1120 Opioid dependence, uncomplicated: Secondary | ICD-10-CM | POA: Diagnosis present

## 2013-05-14 DIAGNOSIS — F1920 Other psychoactive substance dependence, uncomplicated: Secondary | ICD-10-CM | POA: Diagnosis present

## 2013-05-14 DIAGNOSIS — Z79899 Other long term (current) drug therapy: Secondary | ICD-10-CM

## 2013-05-14 DIAGNOSIS — R159 Full incontinence of feces: Secondary | ICD-10-CM | POA: Diagnosis present

## 2013-05-14 DIAGNOSIS — M4624 Osteomyelitis of vertebra, thoracic region: Secondary | ICD-10-CM | POA: Diagnosis present

## 2013-05-14 LAB — CBC WITH DIFFERENTIAL/PLATELET
Basophils Absolute: 0 K/uL (ref 0.0–0.1)
Basophils Relative: 0 % (ref 0–1)
Eosinophils Absolute: 0.1 K/uL (ref 0.0–0.7)
Eosinophils Relative: 1 % (ref 0–5)
HCT: 37.1 % — ABNORMAL LOW (ref 39.0–52.0)
Hemoglobin: 13.5 g/dL (ref 13.0–17.0)
Lymphocytes Relative: 28 % (ref 12–46)
Lymphs Abs: 1.6 K/uL (ref 0.7–4.0)
MCH: 30.3 pg (ref 26.0–34.0)
MCHC: 36.4 g/dL — ABNORMAL HIGH (ref 30.0–36.0)
MCV: 83.2 fL (ref 78.0–100.0)
Monocytes Absolute: 0.3 K/uL (ref 0.1–1.0)
Monocytes Relative: 5 % (ref 3–12)
Neutro Abs: 3.8 K/uL (ref 1.7–7.7)
Neutrophils Relative %: 66 % (ref 43–77)
Platelets: 230 K/uL (ref 150–400)
RBC: 4.46 MIL/uL (ref 4.22–5.81)
RDW: 12.6 % (ref 11.5–15.5)
WBC: 5.8 K/uL (ref 4.0–10.5)

## 2013-05-14 LAB — COMPREHENSIVE METABOLIC PANEL
ALT: 12 U/L (ref 0–53)
AST: 15 U/L (ref 0–37)
Calcium: 9.7 mg/dL (ref 8.4–10.5)
Creatinine, Ser: 0.5 mg/dL (ref 0.50–1.35)
GFR calc Af Amer: >90 mL/min (ref >90)
GFR calc non Af Amer: >90 mL/min (ref >90)
Sodium: 137 mEq/L (ref 135–145)
Total Protein: 8.5 g/dL — ABNORMAL HIGH (ref 6.0–8.3)

## 2013-05-14 MED ORDER — DIPHENHYDRAMINE HCL 50 MG/ML IJ SOLN
50.0000 mg | Freq: Four times a day (QID) | INTRAMUSCULAR | Status: DC | PRN
  Administered 2013-05-14: 25 mg via INTRAVENOUS

## 2013-05-14 MED ORDER — ONDANSETRON HCL 4 MG/2ML IJ SOLN: 4.0000 mg | Freq: Three times a day (TID) | INTRAMUSCULAR | Status: DC | PRN

## 2013-05-14 MED ORDER — OXYCODONE HCL 5 MG PO TABS
15.0000 mg | ORAL_TABLET | Freq: Four times a day (QID) | ORAL | Status: DC | PRN
  Administered 2013-05-14 – 2013-05-19 (×18): 15 mg via ORAL
  Filled 2013-05-14 (×18): qty 3

## 2013-05-14 MED ORDER — HYDROMORPHONE HCL PF 1 MG/ML IJ SOLN
1.0000 mg | Freq: Once | INTRAMUSCULAR | Status: AC
  Administered 2013-05-14: 1 mg via INTRAVENOUS
  Filled 2013-05-14 (×2): qty 1

## 2013-05-14 MED ORDER — NICOTINE 21 MG/24HR TD PT24
21.0000 mg | MEDICATED_PATCH | Freq: Every day | TRANSDERMAL | Status: DC
  Administered 2013-05-14 – 2013-05-24 (×11): 21 mg via TRANSDERMAL
  Filled 2013-05-14 (×14): qty 1

## 2013-05-14 MED ORDER — ACETAMINOPHEN 325 MG PO TABS
650.0000 mg | ORAL_TABLET | Freq: Four times a day (QID) | ORAL | Status: DC | PRN
  Filled 2013-05-14 (×2): qty 2

## 2013-05-14 MED ORDER — DIPHENHYDRAMINE HCL 50 MG/ML IJ SOLN
25.0000 mg | Freq: Once | INTRAMUSCULAR | Status: DC
  Filled 2013-05-14 (×2): qty 1

## 2013-05-14 MED ORDER — SODIUM CHLORIDE 0.9 % IV SOLN
500.0000 mg | Freq: Four times a day (QID) | INTRAVENOUS | Status: DC
  Administered 2013-05-14: 500 mg via INTRAVENOUS
  Filled 2013-05-14 (×3): qty 500

## 2013-05-14 MED ORDER — NAPROXEN 500 MG PO TABS
500.0000 mg | ORAL_TABLET | Freq: Two times a day (BID) | ORAL | Status: DC
  Administered 2013-05-15 – 2013-05-27 (×23): 500 mg via ORAL
  Filled 2013-05-14 (×30): qty 1

## 2013-05-14 MED ORDER — CEFTRIAXONE SODIUM 2 G IJ SOLR
2.0000 g | Freq: Once | INTRAMUSCULAR | Status: DC
  Filled 2013-05-14: qty 2

## 2013-05-14 MED ORDER — VANCOMYCIN HCL IN DEXTROSE 1-5 GM/200ML-% IV SOLN
1000.0000 mg | Freq: Three times a day (TID) | INTRAVENOUS | Status: DC
  Administered 2013-05-14: 1000 mg via INTRAVENOUS
  Filled 2013-05-14 (×2): qty 200

## 2013-05-14 MED ORDER — ONDANSETRON HCL 4 MG/2ML IJ SOLN
4.0000 mg | Freq: Once | INTRAMUSCULAR | Status: AC
  Administered 2013-05-14: 4 mg via INTRAVENOUS
  Filled 2013-05-14 (×2): qty 2

## 2013-05-14 MED ORDER — MORPHINE SULFATE 4 MG/ML IJ SOLN: 4.0000 mg | Freq: Once | INTRAMUSCULAR | Status: DC

## 2013-05-14 MED ORDER — OXYCODONE HCL 5 MG PO TABS: 5.0000 mg | ORAL_TABLET | Freq: Once | ORAL | Status: DC

## 2013-05-14 MED ORDER — LEVOFLOXACIN IN D5W 750 MG/150ML IV SOLN
750.0000 mg | INTRAVENOUS | Status: DC
  Administered 2013-05-14: 750 mg via INTRAVENOUS
  Filled 2013-05-14: qty 150

## 2013-05-14 MED ORDER — OXYCODONE HCL 5 MG PO TABS
15.0000 mg | ORAL_TABLET | Freq: Once | ORAL | Status: AC
  Administered 2013-05-14: 15 mg via ORAL
  Filled 2013-05-14: qty 3

## 2013-05-14 MED ORDER — ACETAMINOPHEN 650 MG RE SUPP: 650.0000 mg | Freq: Four times a day (QID) | RECTAL | Status: DC | PRN

## 2013-05-14 MED ORDER — ENOXAPARIN SODIUM 40 MG/0.4ML ~~LOC~~ SOLN
40.0000 mg | SUBCUTANEOUS | Status: DC
  Administered 2013-05-15 – 2013-05-27 (×13): 40 mg via SUBCUTANEOUS
  Filled 2013-05-14 (×15): qty 0.4

## 2013-05-14 MED ORDER — HYDROMORPHONE HCL PF 1 MG/ML IJ SOLN
1.0000 mg | INTRAMUSCULAR | Status: DC | PRN
  Administered 2013-05-14: 1 mg via INTRAVENOUS
  Filled 2013-05-14: qty 1

## 2013-05-14 MED ORDER — DIPHENHYDRAMINE HCL 50 MG/ML IJ SOLN: 25.0000 mg | Freq: Four times a day (QID) | INTRAMUSCULAR | Status: DC | PRN

## 2013-05-14 MED ORDER — ONDANSETRON HCL 4 MG PO TABS: 4.0000 mg | ORAL_TABLET | Freq: Four times a day (QID) | ORAL | Status: DC | PRN

## 2013-05-14 MED ORDER — HYDROMORPHONE HCL PF 1 MG/ML IJ SOLN
1.0000 mg | INTRAMUSCULAR | Status: DC | PRN
  Administered 2013-05-14 – 2013-05-18 (×28): 2 mg via INTRAVENOUS
  Filled 2013-05-14 (×28): qty 2

## 2013-05-14 MED ORDER — HYDROMORPHONE HCL PF 2 MG/ML IJ SOLN
2.0000 mg | INTRAMUSCULAR | Status: AC
  Administered 2013-05-14: 2 mg via INTRAMUSCULAR
  Filled 2013-05-14: qty 1

## 2013-05-14 MED ORDER — GADOBENATE DIMEGLUMINE 529 MG/ML IV SOLN
15.0000 mL | Freq: Once | INTRAVENOUS | Status: AC | PRN
  Administered 2013-05-14: 15 mL via INTRAVENOUS

## 2013-05-14 MED ORDER — LORAZEPAM 2 MG/ML IJ SOLN
1.0000 mg | Freq: Once | INTRAMUSCULAR | Status: AC
  Administered 2013-05-14: 1 mg via INTRAVENOUS
  Filled 2013-05-14: qty 1

## 2013-05-14 MED ORDER — HYDROMORPHONE HCL PF 2 MG/ML IJ SOLN
1.5000 mg | Freq: Once | INTRAMUSCULAR | Status: AC
  Administered 2013-05-14: 1.5 mg via INTRAVENOUS
  Filled 2013-05-14: qty 1

## 2013-05-14 MED ORDER — PANTOPRAZOLE SODIUM 40 MG PO TBEC
40.0000 mg | DELAYED_RELEASE_TABLET | Freq: Every day | ORAL | Status: DC
  Administered 2013-05-14 – 2013-05-27 (×13): 40 mg via ORAL
  Filled 2013-05-14 (×13): qty 1

## 2013-05-14 MED ORDER — ONDANSETRON HCL 4 MG/2ML IJ SOLN
4.0000 mg | Freq: Four times a day (QID) | INTRAMUSCULAR | Status: DC | PRN
  Administered 2013-05-18 – 2013-05-22 (×2): 4 mg via INTRAVENOUS
  Filled 2013-05-14 (×3): qty 2

## 2013-05-14 MED ORDER — SODIUM CHLORIDE 0.9 % IV SOLN
INTRAVENOUS | Status: AC
  Administered 2013-05-14: 18:00:00 via INTRAVENOUS

## 2013-05-14 MED ORDER — DIPHENHYDRAMINE HCL 50 MG/ML IJ SOLN
INTRAMUSCULAR | Status: AC
  Filled 2013-05-14: qty 1

## 2013-05-14 MED ORDER — SODIUM CHLORIDE 0.9 % IV SOLN
INTRAVENOUS | Status: DC
  Administered 2013-05-16 – 2013-05-26 (×9): via INTRAVENOUS

## 2013-05-14 MED ORDER — OXYCODONE HCL 5 MG PO TABS: 15.0000 mg | ORAL_TABLET | ORAL | Status: DC | PRN

## 2013-05-14 MED ORDER — POLYETHYLENE GLYCOL 3350 17 G PO PACK
17.0000 g | PACK | Freq: Every day | ORAL | Status: DC | PRN
  Administered 2013-05-18 – 2013-05-24 (×2): 17 g via ORAL
  Filled 2013-05-14 (×2): qty 1

## 2013-05-14 NOTE — ED Provider Notes (Signed)
Medical screening examination/treatment/procedure(s) were conducted as a shared visit with non-physician practitioner(s) and myself.  I personally evaluated the patient during the encounter  Doug Sou, MD 05/14/13 [phone]
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
## 2013-05-14 NOTE — H&P (Signed)
[facility] History and Physical  Earlie Schank ZOX:[id number] DOB: Feb 14, 1981 DOA: 05/14/2013  Referring physician: Dr Ethelda Chick PCP: No PCP Per Patient  Specialists:   Chief Complaint:  Fever with Increased upper back pain and bowel incontinence x1.  HPI: Joshuan Bolander is a 32 y.o. male was used to T7/8 in March 2014 osteomyelitis treated at [facility] he states he was compliant the with antibiotics for the  recommended duration, chronic back pain, narcotic dependence, and hospitalized x2 here at [facility] this month for snake bite and subsequent serum sickness who presents with above complaints. He states that overnight he had a temperature 102.4 with increased upper back pain, and early this morning he had an episode of bowel incontinence x1. He also admits to numbness in both lower extremities, denies weakness. He was seen in the ED and MRI of thoracic spine show focal disc osteomyelitis at T7-8 with slight collapse of the superior endplate of T8 and focal kyphosis. MRI lumbar spine was also done and showed central disc protrusion at L4-5 with mild left lateral recess and bilateral foraminal stenosis-no evidence for additional infection within the lumbar spine. He was started on empiric antibiotics and is admitted for further evaluation and management. Review of Systems: The patient denies anorexia, weight loss,, vision loss, decreased hearing, hoarseness, chest pain, syncope, dyspnea on exertion, peripheral edema, balance deficits, hemoptysis, abdominal pain, melena, hematochezia, severe indigestion/heartburn, hematuria, incontinence, genital sores, muscle weakness, suspicious skin lesions, transient blindness, difficulty walking, depression, unusual weight change, abnormal bleeding.  Past Medical History  Diagnosis Date  . Medical history non-contributory  04/2013    snake bite  . Medical history non-contributory    Past Surgical History  Procedure Laterality Date  . Exploratory  laparotomy     Social History:  reports that he has been smoking Cigarettes.  He has a 34 pack-year smoking history. He has never used smokeless tobacco. He reports that he does not drink alcohol or use illicit drugs. where does patient live--home Can patient participate in ADLs?  Allergies  Allergen Reactions  . Darvocet (Propoxyphene-Acetaminophen) Anaphylaxis  . Penicillins Anaphylaxis and Hives  . Sulfa Antibiotics Anaphylaxis  . Toradol (Ketorolac Tromethamine) Anaphylaxis    family history:History reviewed. No pertinent family history.   Prior to Admission medications   Medication Sig Start Date End Date Taking? Authorizing Provider  naproxen (NAPROSYN) 500 MG tablet Take 1 tablet (500 mg total) by mouth 2 (two) times daily with a meal. 05/12/13  Yes Misao Fackrell C Shann Merrick, MD  oxyCODONE (ROXICODONE) 15 MG immediate release tablet Take 1 tablet (15 mg total) by mouth every 4 (four) hours as needed. 05/08/13  Yes Leroy Sea, MD  polyethylene glycol (MIRALAX / GLYCOLAX) packet Take 17 g by mouth daily as needed (constipation). 05/08/13  Yes Leroy Sea, MD  nicotine (NICODERM CQ - DOSED IN MG/24 HOURS) 21 mg/24hr patch Place 1 patch onto the skin daily. 05/08/13   Leroy Sea, MD  ondansetron (ZOFRAN-ODT) 4 MG disintegrating tablet Take 1 tablet (4 mg total) by mouth once. 05/09/13   Toney Sang, MD  pantoprazole (PROTONIX) 40 MG tablet Take 1 tablet (40 mg total) by mouth daily. 05/12/13   Kela Millin, MD   Physical Exam: Filed Vitals:   05/14/13 1037 05/14/13 1354 05/14/13 1617 05/14/13 1731  BP: 139/92 106/64 115/58 127/91  Pulse: 86 67 102 99  Temp: 97.8 F (36.6 C) 98 F (36.7 C)    TempSrc: Oral Oral    Resp: 20 18  18   Height:   5' 8.9" (1.75 m)   Weight:   74 kg (163 lb 2.3 oz)   SpO2: 100% 98% 97% 95%    Constitutional: Vital signs reviewed.  Patient is a well-developed and well-nourished  in no acute distress and cooperative with exam. Alert and  oriented x3.  Head: Normocephalic and atraumatic Nose: No erythema or drainage noted.  Turbinates normal Mouth: no erythema or exudates, MMM Eyes: PERRL, EOMI, conjunctivae normal, No scleral icterus.  Neck: Supple, Trachea midline normal ROM, No JVD, mass, thyromegaly, or carotid bruit present.  Cardiovascular: RRR, S1 normal, S2 normal, no MRG, pulses symmetric and intact bilaterally Pulmonary/Chest: normal respiratory effort, CTAB, no wheezes, rales, or rhonchi Abdominal: Soft. Non-tender, non-distended, bowel sounds are normal, no masses, organomegaly, or guarding present.  Back: Mild tenderness over mid thoracic spine  Extremities: No cyanosis and no edema  Neurological: A&O x3, Strength is 4-5/5 and symmetric, cranial nerve II-XII are grossly intact, no focal motor deficit, sensory intact to light touch bilaterally.  Skin: Warm, dry and intact.  Psychiatric: Normal mood and affect. speech and behavior is normal.   Labs on Admission:  Basic Metabolic Panel:  Recent Labs Lab 05/08/13 0500 05/10/13 0557 05/10/13 0613 05/12/13 0600 05/14/13 1313  NA 138 137 139 138 137  K 3.7 4.1 4.1 4.0 3.8  CL 100 99 104 103 103  CO2 24 25  --  25 23  GLUCOSE 92 107* 104* 120* 120*  BUN 10 14 14 10 11   CREATININE 0.71 0.70 0.70 0.58 0.50  CALCIUM 9.7 9.6  --  9.4 9.7   Liver Function Tests:  Recent Labs Lab 05/10/13 0557 05/14/13 1313  AST 13 15  ALT 8 12  ALKPHOS 88 101  BILITOT 1.9* 0.7  PROT 8.2 8.5*  ALBUMIN 3.8 3.8   No results found for this basename: LIPASE, AMYLASE,  in the last 168 hours No results found for this basename: AMMONIA,  in the last 168 hours CBC:  Recent Labs Lab 05/08/13 0500 05/09/13 0944 05/10/13 0557 05/10/13 0613 05/11/13 0625 05/14/13 1313  WBC 8.7  --  7.3  --  5.9 5.8  NEUTROABS  --   --   --   --   --  3.8  HGB 13.4  --  14.0 13.9 12.6* 13.5  HCT 36.9*  --  39.3 41.0 35.6* 37.1*  MCV 83.5  --  83.8  --  85.0 83.2  PLT 187 172 179  --   174 230   Cardiac Enzymes: No results found for this basename: CKTOTAL, CKMB, CKMBINDEX, TROPONINI,  in the last 168 hours  BNP (last 3 results) No results found for this basename: PROBNP,  in the last 8760 hours CBG: No results found for this basename: GLUCAP,  in the last 168 hours  Radiological Exams on Admission: Mr Thoracic Spine W Wo Contrast  05/14/2013   *RADIOLOGY REPORT*  Clinical Data:  Osteomyelitis of the spine.  Increased pain and swelling in the same area.  The bowel incontinence.  The patient states he had similar symptoms when diagnosed with osteomyelitis in March.  There is no imaging from that time.  MRI THORACIC AND LUMBAR SPINE WITHOUT AND WITH CONTRAST  Technique:  Multiplanar and multiecho pulse sequences of the thoracic and lumbar spine were obtained without and with intravenous contrast.  Contrast: 15mL MULTIHANCE GADOBENATE DIMEGLUMINE 529 MG/ML IV SOLN  Comparison:   None.  MRI THORACIC SPINE  Findings: Normal signal is  present in the thoracic spinal cord to the conus medullaris which terminates at T12.  Abnormal marrow signal is present throughout the T7 and T8 vertebral body.  The signal abnormality extends into the pedicles bilaterally at T8. There is slight compression of the superior endplate of T8 with focal kyphosis.  Abnormal fluid signal is present within the disc space.  There is also enhancement within the disc space and focally at the prevertebral space anterior to T7 and T8.  There is no soft tissue abscess.  No significant epidural enhancement is present.  There is no epidural abscess.  There is no enhancement of the cord.  The remaining disc levels demonstrate normal signal.  There is no focal disc herniation or stenosis elsewhere.  Marrow signal at the remaining levels is normal.  IMPRESSION:  1.  Focal disc osteomyelitis at T7-8 with slight collapse of the superior endplate of T8 and focal kyphosis. 2.  Mild paravertebral enhancement without a discrete abscess.  3.  No significant epidural enhancement or cord signal abnormality.  MRI LUMBAR SPINE  Findings: Normal signal is present in the conus medullaris which terminates at T12, within normal limits.  Marrow signal, vertebral body heights, alignment are normal.  The disc levels at L3-4 above are normal.  A transitional L5 or S1 segment is evident.  The purposes of this exam, this will be labeled L5.  A central disc protrusion is evident at L4-5.  Mild facet hypertrophy contributes to mild foraminal narrowing bilaterally.  Fatty marrow infiltration is noted along the sacral hiatus to portions of L5 on the right.  The postcontrast images demonstrate no pathologic enhancement.  IMPRESSION:  1.  Central disc protrusion at L4-5 with mild left lateral recess and bilateral foraminal stenosis. 2.  Transitional L5 segment.  Please see the discussion above. 3.  No evidence for additional infection within the lumbar spine.   Original Report Authenticated By: Marin Roberts, M.D.   Mr Lumbar Spine W Wo Contrast  05/14/2013   *RADIOLOGY REPORT*  Clinical Data:  Osteomyelitis of the spine.  Increased pain and swelling in the same area.  The bowel incontinence.  The patient states he had similar symptoms when diagnosed with osteomyelitis in March.  There is no imaging from that time.  MRI THORACIC AND LUMBAR SPINE WITHOUT AND WITH CONTRAST  Technique:  Multiplanar and multiecho pulse sequences of the thoracic and lumbar spine were obtained without and with intravenous contrast.  Contrast: 15mL MULTIHANCE GADOBENATE DIMEGLUMINE 529 MG/ML IV SOLN  Comparison:   None.  MRI THORACIC SPINE  Findings: Normal signal is present in the thoracic spinal cord to the conus medullaris which terminates at T12.  Abnormal marrow signal is present throughout the T7 and T8 vertebral body.  The signal abnormality extends into the pedicles bilaterally at T8. There is slight compression of the superior endplate of T8 with focal kyphosis.  Abnormal  fluid signal is present within the disc space.  There is also enhancement within the disc space and focally at the prevertebral space anterior to T7 and T8.  There is no soft tissue abscess.  No significant epidural enhancement is present.  There is no epidural abscess.  There is no enhancement of the cord.  The remaining disc levels demonstrate normal signal.  There is no focal disc herniation or stenosis elsewhere.  Marrow signal at the remaining levels is normal.  IMPRESSION:  1.  Focal disc osteomyelitis at T7-8 with slight collapse of the superior endplate of T8 and  focal kyphosis. 2.  Mild paravertebral enhancement without a discrete abscess. 3.  No significant epidural enhancement or cord signal abnormality.  MRI LUMBAR SPINE  Findings: Normal signal is present in the conus medullaris which terminates at T12, within normal limits.  Marrow signal, vertebral body heights, alignment are normal.  The disc levels at L3-4 above are normal.  A transitional L5 or S1 segment is evident.  The purposes of this exam, this will be labeled L5.  A central disc protrusion is evident at L4-5.  Mild facet hypertrophy contributes to mild foraminal narrowing bilaterally.  Fatty marrow infiltration is noted along the sacral hiatus to portions of L5 on the right.  The postcontrast images demonstrate no pathologic enhancement.  IMPRESSION:  1.  Central disc protrusion at L4-5 with mild left lateral recess and bilateral foraminal stenosis. 2.  Transitional L5 segment.  Please see the discussion above. 3.  No evidence for additional infection within the lumbar spine.   Original Report Authenticated By: Marin Roberts, M.D.     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 Danielle Dess to see patient -Follow and consult ID in a.m. for antibiotic  recommendations/duration Chronic narcotic dependence Chronic back pain -Continue pain management Recent Snake bite complicated by serum sickness     Code Status: full Family Communication: none at bedside Disposition Plan: admit to med surge, to home when ready.  Time spent: >30  Kela Millin [facility] Pager [phone]  If 7PM-7AM, please contact night-coverage [email or web address] Password [facility] 05/14/2013, 6:32 PM

## 2013-05-14 NOTE — ED Provider Notes (Signed)
Complains of midthoracic back pain with one episode bowel incontinence. Patient reports temperature 102 this morning. Treated himself with Motrin prior to coming here. He was  Doug Sou, MD 05/14/13 1358

## 2013-05-14 NOTE — ED Notes (Signed)
Patient transported to MRI 

## 2013-05-14 NOTE — ED Provider Notes (Signed)
Medical Screening Exam  12:03 PM Pt placed in fast track.  Hx osteomyelitis of spine in March 2014.  Pt reports increased pain and swelling in same area, fever, bowel incontinence this morning.  States these are the symptoms he had in March.  Tender area of swelling over localized area of T-spine.  I have asked nurse to move to the main ED.    Trixie Dredge, PA-C 05/14/13 1204

## 2013-05-14 NOTE — ED Notes (Signed)
Paged iv team 

## 2013-05-14 NOTE — Consult Note (Signed)
Reason for Consult: Osteomyelitis T7-T8 Referring Physician: SEVERIN, BOU is an 32 y.o. male.  HPI: Patient is a 32 year old right-handed male who has had a history of a stimulator is in the past treated at [facility] in April this year. It is suspected that he may have Pseudomonas and he is treated with a 6 weeks course of antibiotics. More recently he was treated at [place] for snake bite and subsequent readmission for serum sickness. Patient presents now having had a recent fever and complains of back pain an MRI was performed which demonstrates that he has a osteomyelitis at T7 T8. He had an episode of bowel incontinence yesterday also which provoked the MRI of the thoracic spine. His thoracic and lumbar spine show no evidence of cord compression a capacious canal. Small disc protrusion at L4-L5 is also noted that isclinically silent.  Past Medical History  Diagnosis Date  . Medical history non-contributory  04/2013    snake bite  . Medical history non-contributory     Past Surgical History  Procedure Laterality Date  . Exploratory laparotomy      No family history on file.  Social History:  reports that he has been smoking Cigarettes.  He has a 34 pack-year smoking history. He has never used smokeless tobacco. He reports that he does not drink alcohol or use illicit drugs.  Allergies:  Allergies  Allergen Reactions  . Darvocet (Propoxyphene-Acetaminophen) Anaphylaxis  . Penicillins Anaphylaxis and Hives  . Sulfa Antibiotics Anaphylaxis  . Toradol (Ketorolac Tromethamine) Anaphylaxis    Medications: Not reviewed  Results for orders placed during the hospital encounter of 05/14/13 (from the past 48 hour(s))  CBC WITH DIFFERENTIAL     Status: Abnormal   Collection Time    05/14/13  1:13 PM      Result Value Range   WBC 5.8  4.0 - 10.5 K/uL   RBC 4.46  4.22 - 5.81 MIL/uL   Hemoglobin 13.5  13.0 - 17.0 g/dL   HCT 16.1 (*) 09.6 - 04.5 %   MCV 83.2   78.0 - 100.0 fL   MCH 30.3  26.0 - 34.0 pg   MCHC 36.4 (*) 30.0 - 36.0 g/dL   RDW 40.9  81.1 - 91.4 %   Platelets 230  150 - 400 K/uL   Neutrophils Relative % 66  43 - 77 %   Neutro Abs 3.8  1.7 - 7.7 K/uL   Lymphocytes Relative 28  12 - 46 %   Lymphs Abs 1.6  0.7 - 4.0 K/uL   Monocytes Relative 5  3 - 12 %   Monocytes Absolute 0.3  0.1 - 1.0 K/uL   Eosinophils Relative 1  0 - 5 %   Eosinophils Absolute 0.1  0.0 - 0.7 K/uL   Basophils Relative 0  0 - 1 %   Basophils Absolute 0.0  0.0 - 0.1 K/uL  COMPREHENSIVE METABOLIC PANEL     Status: Abnormal   Collection Time    05/14/13  1:13 PM      Result Value Range   Sodium 137  135 - 145 mEq/L   Potassium 3.8  3.5 - 5.1 mEq/L   Chloride 103  96 - 112 mEq/L   CO2 23  19 - 32 mEq/L   Glucose, Bld 120 (*) 70 - 99 mg/dL   BUN 11  6 - 23 mg/dL   Creatinine, Ser 7.82  0.50 - 1.35 mg/dL   Calcium 9.7  8.4 - 10.5 mg/dL   Total Protein 8.5 (*) 6.0 - 8.3 g/dL   Albumin 3.8  3.5 - 5.2 g/dL   AST 15  0 - 37 U/L   ALT 12  0 - 53 U/L   Alkaline Phosphatase 101  39 - 117 U/L   Total Bilirubin 0.7  0.3 - 1.2 mg/dL   GFR calc non Af Amer >90  >90 mL/min   GFR calc Af Amer >90  >90 mL/min   Comment:            The eGFR has been calculated     using the CKD EPI equation.     This calculation has not been     validated in all clinical     situations.     eGFR's persistently     <90 mL/min signify     possible Chronic Kidney Disease.    Mr Thoracic Spine W Wo Contrast  05/14/2013   *RADIOLOGY REPORT*  Clinical Data:  Osteomyelitis of the spine.  Increased pain and swelling in the same area.  The bowel incontinence.  The patient states he had similar symptoms when diagnosed with osteomyelitis in March.  There is no imaging from that time.  MRI THORACIC AND LUMBAR SPINE WITHOUT AND WITH CONTRAST  Technique:  Multiplanar and multiecho pulse sequences of the thoracic and lumbar spine were obtained without and with intravenous contrast.  Contrast: 15mL  MULTIHANCE GADOBENATE DIMEGLUMINE 529 MG/ML IV SOLN  Comparison:   None.  MRI THORACIC SPINE  Findings: Normal signal is present in the thoracic spinal cord to the conus medullaris which terminates at T12.  Abnormal marrow signal is present throughout the T7 and T8 vertebral body.  The signal abnormality extends into the pedicles bilaterally at T8. There is slight compression of the superior endplate of T8 with focal kyphosis.  Abnormal fluid signal is present within the disc space.  There is also enhancement within the disc space and focally at the prevertebral space anterior to T7 and T8.  There is no soft tissue abscess.  No significant epidural enhancement is present.  There is no epidural abscess.  There is no enhancement of the cord.  The remaining disc levels demonstrate normal signal.  There is no focal disc herniation or stenosis elsewhere.  Marrow signal at the remaining levels is normal.  IMPRESSION:  1.  Focal disc osteomyelitis at T7-8 with slight collapse of the superior endplate of T8 and focal kyphosis. 2.  Mild paravertebral enhancement without a discrete abscess. 3.  No significant epidural enhancement or cord signal abnormality.  MRI LUMBAR SPINE  Findings: Normal signal is present in the conus medullaris which terminates at T12, within normal limits.  Marrow signal, vertebral body heights, alignment are normal.  The disc levels at L3-4 above are normal.  A transitional L5 or S1 segment is evident.  The purposes of this exam, this will be labeled L5.  A central disc protrusion is evident at L4-5.  Mild facet hypertrophy contributes to mild foraminal narrowing bilaterally.  Fatty marrow infiltration is noted along the sacral hiatus to portions of L5 on the right.  The postcontrast images demonstrate no pathologic enhancement.  IMPRESSION:  1.  Central disc protrusion at L4-5 with mild left lateral recess and bilateral foraminal stenosis. 2.  Transitional L5 segment.  Please see the discussion  above. 3.  No evidence for additional infection within the lumbar spine.   Original Report Authenticated By: Marin Roberts, M.D.  Mr Lumbar Spine W Wo Contrast  05/14/2013   *RADIOLOGY REPORT*  Clinical Data:  Osteomyelitis of the spine.  Increased pain and swelling in the same area.  The bowel incontinence.  The patient states he had similar symptoms when diagnosed with osteomyelitis in March.  There is no imaging from that time.  MRI THORACIC AND LUMBAR SPINE WITHOUT AND WITH CONTRAST  Technique:  Multiplanar and multiecho pulse sequences of the thoracic and lumbar spine were obtained without and with intravenous contrast.  Contrast: 15mL MULTIHANCE GADOBENATE DIMEGLUMINE 529 MG/ML IV SOLN  Comparison:   None.  MRI THORACIC SPINE  Findings: Normal signal is present in the thoracic spinal cord to the conus medullaris which terminates at T12.  Abnormal marrow signal is present throughout the T7 and T8 vertebral body.  The signal abnormality extends into the pedicles bilaterally at T8. There is slight compression of the superior endplate of T8 with focal kyphosis.  Abnormal fluid signal is present within the disc space.  There is also enhancement within the disc space and focally at the prevertebral space anterior to T7 and T8.  There is no soft tissue abscess.  No significant epidural enhancement is present.  There is no epidural abscess.  There is no enhancement of the cord.  The remaining disc levels demonstrate normal signal.  There is no focal disc herniation or stenosis elsewhere.  Marrow signal at the remaining levels is normal.  IMPRESSION:  1.  Focal disc osteomyelitis at T7-8 with slight collapse of the superior endplate of T8 and focal kyphosis. 2.  Mild paravertebral enhancement without a discrete abscess. 3.  No significant epidural enhancement or cord signal abnormality.  MRI LUMBAR SPINE  Findings: Normal signal is present in the conus medullaris which terminates at T12, within normal limits.   Marrow signal, vertebral body heights, alignment are normal.  The disc levels at L3-4 above are normal.  A transitional L5 or S1 segment is evident.  The purposes of this exam, this will be labeled L5.  A central disc protrusion is evident at L4-5.  Mild facet hypertrophy contributes to mild foraminal narrowing bilaterally.  Fatty marrow infiltration is noted along the sacral hiatus to portions of L5 on the right.  The postcontrast images demonstrate no pathologic enhancement.  IMPRESSION:  1.  Central disc protrusion at L4-5 with mild left lateral recess and bilateral foraminal stenosis. 2.  Transitional L5 segment.  Please see the discussion above. 3.  No evidence for additional infection within the lumbar spine.   Original Report Authenticated By: Marin Roberts, M.D.    Review of Systems  Constitutional: Positive for fever.       Episode of loss of bowel control  HENT: Negative.   Eyes: Negative.   Respiratory: Negative.   Cardiovascular: Negative.   Gastrointestinal: Negative.   Genitourinary: Negative.   Musculoskeletal: Positive for back pain.  Skin: Negative.   Neurological:       Episode of loss of bowel control  Endo/Heme/Allergies: Negative.   Psychiatric/Behavioral: Negative.    Blood pressure 140/91, pulse 99, temperature 98.1 F (36.7 C), temperature source Oral, resp. rate 18, height 5' 8.9" (1.75 m), weight 74 kg (163 lb 2.3 oz), SpO2 100.00%. Physical Exam  Constitutional: He appears well-developed and well-nourished.  HENT:  Head: Normocephalic and atraumatic.  Eyes: Conjunctivae and EOM are normal. Pupils are equal, round, and reactive to light.  Neck: Normal range of motion.  Cardiovascular: Normal rate and regular rhythm.   Respiratory: Effort  normal and breath sounds normal.  GI: Soft. Bowel sounds are normal.  Musculoskeletal: Normal range of motion.  Midthoracic back pain  Neurological:  Motor strength strong and symmetric in both lower extremities with  normal reflexes cranial nerve examination is normal also cerebellar testing is normal  Skin: Skin is warm and dry.  Psychiatric: He has a normal mood and affect.    Assessment/Plan: T7-T8 osteomyelitis without any evidence of cord impingement. The patient's bowel incontinence is not related to any structural abnormality at this time. If cultures of a definitive organism is required interventional radiology may be able to provide needle aspiration of the disc space.  Erland Vivas J 05/14/2013, 8:20 PM

## 2013-05-14 NOTE — ED Provider Notes (Signed)
Discussed case with Wynetta Emery, PA-C - transfer of care from [facility], PA-C at 3:20PM. Patient afebrile, stable. Alert and oriented. Bulge noted to the T7-8 region of the back, mid-spinal - pain upon palpation noted - negative erythema, inflammation, swelling, warmth to touch noted. Negative findings to chest/lung and heart exam. Radial and DP pulses palpable, bilaterally. Full ROM to upper and lower extremities with strength 5+/5+ to the upper and lower extremities bilaterally - mild discomfort with strength testing to the lower extremities with radiation of pain the back.  MRI identified focal osteomyelitis at T7-T8 with slight collapse of superior endplate of T8 and focal kyphosis. 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 Patient started on IV NS fluids, IV vancomycin - for empiric antibiotic therapy. Patient pain controlled in ED setting. Patient to be admitted to the hospital for osteomyelitis to thoracic spine - admitted to Med-Surg, admitting physician Dr. Jannifer Franklin.   [employer], PA-C 05/15/13 0201

## 2013-05-14 NOTE — ED Notes (Signed)
Pt states he has a reaction to vancomycin and sts that last time he received medication it needed to be run at a lower rate.  Vancomycin started at 100/hr.

## 2013-05-14 NOTE — Significant Event (Signed)
Rapid Response Event Note  Overview: Time Called: 2328 Arrival Time: 2331 Event Type: Other (Comment) Allergic reaction  Initial Focused Assessment: On arrival pt is A & Ox4, in bed & screaming that he is itching & his throat feels scratchy.  Pt has several small patchy areas of rash on his arms & trunk.  Pt states that he always gets a rash with all IV meds & requires premeds with benadryl.  Interventions: Benadryl 25mg  IV x2  Event Summary: Name of Physician Notified: R. Reidler, PA at 2338  Within 5 min of benadryl administration pt was asking for ice cream, graham crackers, & his next dose of IV dilaudid.  Itching resolved.  Outcome: Stayed in room and stabalized     Carl Valencia

## 2013-05-14 NOTE — Progress Notes (Signed)
ANTIBIOTIC CONSULT NOTE - INITIAL  Pharmacy Consult for Vancomycin/Primaxin Indication: r/o vertebral osteomyelitis  Allergies  Allergen Reactions  . Darvocet (Propoxyphene-Acetaminophen) Anaphylaxis  . Penicillins Anaphylaxis and Hives  . Sulfa Antibiotics Anaphylaxis  . Toradol (Ketorolac Tromethamine) Anaphylaxis    Patient Measurements: Wt=74kg  Vital Signs: Temp: 98 F (36.7 C) (06/01 1354) Temp src: Oral (06/01 1354) BP: 115/58 mmHg (06/01 1617) Pulse Rate: 102 (06/01 1617) Intake/Output from previous day:   Intake/Output from this shift:    Labs:  Recent Labs  05/12/13 0600 05/14/13 1313  WBC  --  5.8  HGB  --  13.5  PLT  --  230  CREATININE 0.58 0.50   The CrCl is unknown because both a height and weight (above a minimum accepted value) are required for this calculation. No results found for this basename: VANCOTROUGH, Leodis Binet, VANCORANDOM, GENTTROUGH, GENTPEAK, GENTRANDOM, TOBRATROUGH, TOBRAPEAK, TOBRARND, AMIKACINPEAK, AMIKACINTROU, AMIKACIN,  in the last 72 hours   Microbiology: Recent Results (from the past 720 hour(s))  URINE CULTURE     Status: None   Collection Time    05/10/13 11:00 AM      Result Value Range Status   Specimen Description URINE, RANDOM   Final   Special Requests NONE   Final   Culture  Setup Time 05/10/2013 12:32   Final   Colony Count NO GROWTH   Final   Culture NO GROWTH   Final   Report Status 05/11/2013 FINAL   Final    Medical History: Past Medical History  Diagnosis Date  . Medical history non-contributory  04/2013    snake bite  . Medical history non-contributory     Medications:   (Not in a hospital admission) Assessment: 32 y/o male patient admitted with worsening back pain requiring broad spectrum antibiotics for r/o vertebral osteomyelitis.   Goal of Therapy:  Vancomycin trough level 15-20 mcg/ml  Plan:  Vancomycin 1g IV q8h, primaxin 500mg  IV q6h and monitor renal function. Expected duration 10  days with resolution of temperature and/or normalization of WBC Measure antibiotic drug levels at steady state  Verlene Mayer, PharmD, [place] Pager [phone] 05/14/2013,5:13 PM

## 2013-05-14 NOTE — ED Notes (Signed)
Pt. Was bitten by a snake and I've had back and numbness in both legs since last night.  My Dr. thinks it might be from the snake bite. Hospitalized for 3 days.  I have had osteomylitis dx back in March.  I go to the Dr. At [facility].

## 2013-05-14 NOTE — ED Provider Notes (Signed)
Medical screening examination/treatment/procedure(s) were conducted as a shared visit with non-physician practitioner(s) and myself.  I personally evaluated the patient during the encounter  Daphna Lafuente, MD 05/14/13 1703 

## 2013-05-15 LAB — BASIC METABOLIC PANEL
BUN: 14 mg/dL (ref 6–23)
Calcium: 9.3 mg/dL (ref 8.4–10.5)
Creatinine, Ser: 0.65 mg/dL (ref 0.50–1.35)
GFR calc Af Amer: >90 mL/min (ref >90)
GFR calc non Af Amer: >90 mL/min (ref >90)

## 2013-05-15 LAB — CBC
HCT: 39.4 % (ref 39.0–52.0)
MCHC: 35.3 g/dL (ref 30.0–36.0)
MCV: 84.7 fL (ref 78.0–100.0)
RDW: 12.8 % (ref 11.5–15.5)

## 2013-05-15 MED ORDER — VANCOMYCIN HCL IN DEXTROSE 1-5 GM/200ML-% IV SOLN
1000.0000 mg | Freq: Three times a day (TID) | INTRAVENOUS | Status: DC
  Administered 2013-05-15 – 2013-05-21 (×19): 1000 mg via INTRAVENOUS
  Filled 2013-05-15 (×22): qty 200

## 2013-05-15 MED ORDER — LEVOFLOXACIN 750 MG PO TABS
750.0000 mg | ORAL_TABLET | Freq: Once | ORAL | Status: AC
  Administered 2013-05-15: 750 mg via ORAL
  Filled 2013-05-15: qty 1

## 2013-05-15 MED ORDER — DIPHENHYDRAMINE HCL 50 MG/ML IJ SOLN
25.0000 mg | Freq: Four times a day (QID) | INTRAMUSCULAR | Status: DC | PRN
  Administered 2013-05-15: 25 mg via INTRAVENOUS
  Administered 2013-05-15 – 2013-05-18 (×8): 50 mg via INTRAVENOUS
  Filled 2013-05-15 (×8): qty 1

## 2013-05-15 NOTE — ED Provider Notes (Signed)
Medical screening examination/treatment/procedure(s) were performed by non-physician practitioner and as supervising physician I was immediately available for consultation/collaboration.  Saxon Barich, MD 05/15/13 0815 

## 2013-05-15 NOTE — Progress Notes (Signed)
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 for antibiotic recommendations/duration   Chronic narcotic dependence:  Chronic back pain  -Continue pain management   Recent Snake bite complicated by serum sickness  Code Status: Full Family Communication: Pt in room (indicate person spoken with, relationship, and if by phone, the number) Disposition Plan: Pending   Consultants:  Infectious Disease  Neurosurgery  Antibiotics:  Levaquin 05/14/13>>>  Vancomycin  05/14/13>>>  HPI/Subjective: Complains of continued back pain, however, pt was observed by me to ambulate with IV pole into hallway without difficulty.  Objective: Filed Vitals:   05/14/13 1731 05/14/13 1840 05/14/13 2232 05/15/13 0409  BP: 127/91 140/91 122/56 123/63  Pulse: 99 99 52 55  Temp:  98.1 F (36.7 C) 98.4 F (36.9 C) 97.9 F (36.6 C)  TempSrc:      Resp:  18 18 18   Height:      Weight:      SpO2: 95% 100% 100% 100%    Intake/Output Summary (Last 24 hours) at 05/15/13 1305 Last data filed at 05/15/13 1158  Gross per 24 hour  Intake    980 ml  Output    500 ml  Net    480 ml   Filed Weights   05/14/13 1617  Weight: 74 kg (163 lb 2.3 oz)    Exam:   General:  Awake, in nad  Cardiovascular: regular, s1, s2  Respiratory: normal resp efforrt  Abdomen: soft, pos bowel sounds  Musculoskeletal: perfused, no clubbing   Data Reviewed: Basic Metabolic Panel:  Recent Labs Lab 05/10/13 0557 05/10/13 0613 05/12/13 0600 05/14/13 1313 05/15/13 0850  NA 137 139 138 137 139  K 4.1 4.1 4.0 3.8 4.0  CL 99 104 103 103 102  CO2 25  --  25 23 27   GLUCOSE 107* 104* 120* 120* 84  BUN 14 14 10 11 14    CREATININE 0.70 0.70 0.58 0.50 0.65  CALCIUM 9.6  --  9.4 9.7 9.3   Liver Function Tests:  Recent Labs Lab 05/10/13 0557 05/14/13 1313  AST 13 15  ALT 8 12  ALKPHOS 88 101  BILITOT 1.9* 0.7  PROT 8.2 8.5*  ALBUMIN 3.8 3.8   No results found for this basename: LIPASE, AMYLASE,  in the last 168 hours No results found for this basename: AMMONIA,  in the last 168 hours CBC:  Recent Labs Lab 05/09/13 0944 05/10/13 0557 05/10/13 0613 05/11/13 0625 05/14/13 1313 05/15/13 0850  WBC  --  7.3  --  5.9 5.8 7.0  NEUTROABS  --   --   --   --  3.8  --   HGB  --  14.0 13.9 12.6* 13.5 13.9  HCT  --  39.3 41.0 35.6* 37.1* 39.4  MCV  --  83.8  --  85.0 83.2 84.7  PLT 172 179  --  174 230 194   Cardiac Enzymes: No results found for this basename: CKTOTAL, CKMB, CKMBINDEX, TROPONINI,  in the last 168 hours BNP (last 3 results) No results found for this basename: PROBNP,  in the last 8760 hours CBG: No results found for this basename: GLUCAP,  in the last 168 hours  Recent Results (from the past 240 hour(s))  URINE CULTURE  Status: None   Collection Time    05/10/13 11:00 AM      Result Value Range Status   Specimen Description URINE, RANDOM   Final   Special Requests NONE   Final   Culture  Setup Time 05/10/2013 12:32   Final   Colony Count NO GROWTH   Final   Culture NO GROWTH   Final   Report Status 05/11/2013 FINAL   Final     Studies: Mr Thoracic Spine W Wo Contrast  05/14/2013   *RADIOLOGY REPORT*  Clinical Data:  Osteomyelitis of the spine.  Increased pain and swelling in the same area.  The bowel incontinence.  The patient states he had similar symptoms when diagnosed with osteomyelitis in March.  There is no imaging from that time.  MRI THORACIC AND LUMBAR SPINE WITHOUT AND WITH CONTRAST  Technique:  Multiplanar and multiecho pulse sequences of the thoracic and lumbar spine were obtained without and with intravenous contrast.  Contrast: 15mL MULTIHANCE GADOBENATE  DIMEGLUMINE 529 MG/ML IV SOLN  Comparison:   None.  MRI THORACIC SPINE  Findings: Normal signal is present in the thoracic spinal cord to the conus medullaris which terminates at T12.  Abnormal marrow signal is present throughout the T7 and T8 vertebral body.  The signal abnormality extends into the pedicles bilaterally at T8. There is slight compression of the superior endplate of T8 with focal kyphosis.  Abnormal fluid signal is present within the disc space.  There is also enhancement within the disc space and focally at the prevertebral space anterior to T7 and T8.  There is no soft tissue abscess.  No significant epidural enhancement is present.  There is no epidural abscess.  There is no enhancement of the cord.  The remaining disc levels demonstrate normal signal.  There is no focal disc herniation or stenosis elsewhere.  Marrow signal at the remaining levels is normal.  IMPRESSION:  1.  Focal disc osteomyelitis at T7-8 with slight collapse of the superior endplate of T8 and focal kyphosis. 2.  Mild paravertebral enhancement without a discrete abscess. 3.  No significant epidural enhancement or cord signal abnormality.  MRI LUMBAR SPINE  Findings: Normal signal is present in the conus medullaris which terminates at T12, within normal limits.  Marrow signal, vertebral body heights, alignment are normal.  The disc levels at L3-4 above are normal.  A transitional L5 or S1 segment is evident.  The purposes of this exam, this will be labeled L5.  A central disc protrusion is evident at L4-5.  Mild facet hypertrophy contributes to mild foraminal narrowing bilaterally.  Fatty marrow infiltration is noted along the sacral hiatus to portions of L5 on the right.  The postcontrast images demonstrate no pathologic enhancement.  IMPRESSION:  1.  Central disc protrusion at L4-5 with mild left lateral recess and bilateral foraminal stenosis. 2.  Transitional L5 segment.  Please see the discussion above. 3.  No evidence for  additional infection within the lumbar spine.   Original Report Authenticated By: Marin Roberts, M.D.   Mr Lumbar Spine W Wo Contrast  05/14/2013   *RADIOLOGY REPORT*  Clinical Data:  Osteomyelitis of the spine.  Increased pain and swelling in the same area.  The bowel incontinence.  The patient states he had similar symptoms when diagnosed with osteomyelitis in March.  There is no imaging from that time.  MRI THORACIC AND LUMBAR SPINE WITHOUT AND WITH CONTRAST  Technique:  Multiplanar and multiecho pulse sequences of the thoracic  and lumbar spine were obtained without and with intravenous contrast.  Contrast: 15mL MULTIHANCE GADOBENATE DIMEGLUMINE 529 MG/ML IV SOLN  Comparison:   None.  MRI THORACIC SPINE  Findings: Normal signal is present in the thoracic spinal cord to the conus medullaris which terminates at T12.  Abnormal marrow signal is present throughout the T7 and T8 vertebral body.  The signal abnormality extends into the pedicles bilaterally at T8. There is slight compression of the superior endplate of T8 with focal kyphosis.  Abnormal fluid signal is present within the disc space.  There is also enhancement within the disc space and focally at the prevertebral space anterior to T7 and T8.  There is no soft tissue abscess.  No significant epidural enhancement is present.  There is no epidural abscess.  There is no enhancement of the cord.  The remaining disc levels demonstrate normal signal.  There is no focal disc herniation or stenosis elsewhere.  Marrow signal at the remaining levels is normal.  IMPRESSION:  1.  Focal disc osteomyelitis at T7-8 with slight collapse of the superior endplate of T8 and focal kyphosis. 2.  Mild paravertebral enhancement without a discrete abscess. 3.  No significant epidural enhancement or cord signal abnormality.  MRI LUMBAR SPINE  Findings: Normal signal is present in the conus medullaris which terminates at T12, within normal limits.  Marrow signal, vertebral  body heights, alignment are normal.  The disc levels at L3-4 above are normal.  A transitional L5 or S1 segment is evident.  The purposes of this exam, this will be labeled L5.  A central disc protrusion is evident at L4-5.  Mild facet hypertrophy contributes to mild foraminal narrowing bilaterally.  Fatty marrow infiltration is noted along the sacral hiatus to portions of L5 on the right.  The postcontrast images demonstrate no pathologic enhancement.  IMPRESSION:  1.  Central disc protrusion at L4-5 with mild left lateral recess and bilateral foraminal stenosis. 2.  Transitional L5 segment.  Please see the discussion above. 3.  No evidence for additional infection within the lumbar spine.   Original Report Authenticated By: Marin Roberts, M.D.    Scheduled Meds: . diphenhydrAMINE  25 mg Intravenous Once  . enoxaparin (LOVENOX) injection  40 mg Subcutaneous Q24H  . levofloxacin  750 mg Oral Once  . naproxen  500 mg Oral BID WC  . nicotine  21 mg Transdermal Daily  . pantoprazole  40 mg Oral Daily  . vancomycin  1,000 mg Intravenous Q8H   Continuous Infusions: . sodium chloride      Active Problems:   Chronic narcotic dependence   Chronic back pain   Osteomyelitis of thoracic region  Time spent:  CHIU, STEPHEN K  [facility] Pager [phone]. If 7PM-7AM, please contact night-coverage at [email or web address], password [facility] 05/15/2013, 1:06 PM  LOS: 1 day

## 2013-05-16 DIAGNOSIS — K759 Inflammatory liver disease, unspecified: Secondary | ICD-10-CM

## 2013-05-16 MED ORDER — LOPERAMIDE HCL 2 MG PO CAPS
2.0000 mg | ORAL_CAPSULE | Freq: Three times a day (TID) | ORAL | Status: DC | PRN
  Filled 2013-05-16: qty 1

## 2013-05-16 NOTE — Progress Notes (Signed)
At bedside to discuss PICC placement with patient.  After reviewing procedure, risk and benefit, patient stated that he was really nervous and would prefer to be "out" for line.  Discussed that unable to do conscience sedation at bedside and would need to go to radiology to do.  PIV placed and bedside RN updated.  Clydie Braun, RN to call MD regarding sedation. Eissa Buchberger, Lajean Manes

## 2013-05-16 NOTE — Progress Notes (Signed)
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 for antibiotic recommendations/duration  - Afebrile -PICC line ordered secondary to difficulty obtaining IV access. Chronic narcotic dependence:  Chronic back pain  -Continue pain management  Recent Snake bite complicated by serum sickness   Code Status: Full Family Communication: Pt in room (indicate person spoken with, relationship, and if by phone, the number) Disposition Plan: Pending   Consultants:  Neurosurgery  Antibiotics: Levaquin 05/14/13>>>  Vancomycin 05/14/13>>>   HPI/Subjective: Pt reports back pain  Objective: Filed Vitals:   05/15/13 1504 05/15/13 2155 05/16/13 0623 05/16/13 1443  BP: 125/82 136/99 128/77 138/73  Pulse: 66 71 70 69  Temp: 98.3 F (36.8 C) 98.2 F (36.8 C) 97.9 F (36.6 C) 98.3 F (36.8 C)  TempSrc:  Oral Oral   Resp: 20 18 18 20   Height:      Weight:      SpO2: 100% 100% 100% 100%    Intake/Output Summary (Last 24 hours) at 05/16/13 1703 Last data filed at 05/16/13 1300  Gross per 24 hour  Intake   1200 ml  Output   1000 ml  Net    200 ml   Filed Weights   05/14/13 1617  Weight: 74 kg (163 lb 2.3 oz)    Exam:   General:  Awake, in nad  Cardiovascular: regular, s1, s2  Respiratory: normal resp effort  Abdomen: soft, nondistended  Musculoskeletal: perfused, no clubbing   Data Reviewed: Basic Metabolic Panel:  Recent Labs Lab 05/10/13 0557 05/10/13 0613 05/12/13 0600 05/14/13 1313 05/15/13 0850  NA 137 139 138 137 139  K 4.1 4.1 4.0 3.8 4.0  CL 99 104 103 103 102  CO2 25  --  25 23 27   GLUCOSE 107* 104* 120* 120* 84  BUN 14 14 10 11 14   CREATININE 0.70 0.70 0.58 0.50 0.65   CALCIUM 9.6  --  9.4 9.7 9.3   Liver Function Tests:  Recent Labs Lab 05/10/13 0557 05/14/13 1313  AST 13 15  ALT 8 12  ALKPHOS 88 101  BILITOT 1.9* 0.7  PROT 8.2 8.5*  ALBUMIN 3.8 3.8   No results found for this basename: LIPASE, AMYLASE,  in the last 168 hours No results found for this basename: AMMONIA,  in the last 168 hours CBC:  Recent Labs Lab 05/10/13 0557 05/10/13 0613 05/11/13 0625 05/14/13 1313 05/15/13 0850  WBC 7.3  --  5.9 5.8 7.0  NEUTROABS  --   --   --  3.8  --   HGB 14.0 13.9 12.6* 13.5 13.9  HCT 39.3 41.0 35.6* 37.1* 39.4  MCV 83.8  --  85.0 83.2 84.7  PLT 179  --  174 230 194   Cardiac Enzymes: No results found for this basename: CKTOTAL, CKMB, CKMBINDEX, TROPONINI,  in the last 168 hours BNP (last 3 results) No results found for this basename: PROBNP,  in the last 8760 hours CBG: No results found for this basename: GLUCAP,  in the last 168 hours  Recent Results (from the past 240 hour(s))  URINE CULTURE     Status: None   Collection Time    05/10/13 11:00 AM      Result Value Range Status   Specimen Description URINE, RANDOM  Final   Special Requests NONE   Final   Culture  Setup Time 05/10/2013 12:32   Final   Colony Count NO GROWTH   Final   Culture NO GROWTH   Final   Report Status 05/11/2013 FINAL   Final  CLOSTRIDIUM DIFFICILE BY PCR     Status: None   Collection Time    05/16/13  2:22 PM      Result Value Range Status   C difficile by pcr NEGATIVE  NEGATIVE Final     Studies: No results found.  Scheduled Meds: . diphenhydrAMINE  25 mg Intravenous Once  . enoxaparin (LOVENOX) injection  40 mg Subcutaneous Q24H  . naproxen  500 mg Oral BID WC  . nicotine  21 mg Transdermal Daily  . pantoprazole  40 mg Oral Daily  . vancomycin  1,000 mg Intravenous Q8H   Continuous Infusions: . sodium chloride 75 mL/hr at 05/16/13 0245    Active Problems:   Chronic narcotic dependence   Chronic back pain   Osteomyelitis of thoracic  region    Time spent:    Carl Valencia K  [facility] Pager [phone]. If 7PM-7AM, please contact night-coverage at [email or web address], password [facility] 05/16/2013, 5:03 PM  LOS: 2 days

## 2013-05-17 ENCOUNTER — Encounter (HOSPITAL_COMMUNITY): Payer: Self-pay | Admitting: Radiology

## 2013-05-17 LAB — COMPREHENSIVE METABOLIC PANEL
AST: 14 U/L (ref 0–37)
Albumin: 3.1 g/dL — ABNORMAL LOW (ref 3.5–5.2)
Alkaline Phosphatase: 92 U/L (ref 39–117)
BUN: 14 mg/dL (ref 6–23)
Chloride: 103 mEq/L (ref 96–112)
Potassium: 3.9 mEq/L (ref 3.5–5.1)
Sodium: 136 mEq/L (ref 135–145)
Total Protein: 6.8 g/dL (ref 6.0–8.3)

## 2013-05-17 LAB — CBC WITH DIFFERENTIAL/PLATELET
Basophils Absolute: 0 10*3/uL (ref 0.0–0.1)
Basophils Relative: 0 % (ref 0–1)
Eosinophils Absolute: 0.5 10*3/uL (ref 0.0–0.7)
MCH: 29.2 pg (ref 26.0–34.0)
MCHC: 35.1 g/dL (ref 30.0–36.0)
Neutro Abs: 2.6 10*3/uL (ref 1.7–7.7)
Neutrophils Relative %: 39 % — ABNORMAL LOW (ref 43–77)
Platelets: 176 10*3/uL (ref 150–400)
RDW: 12.5 % (ref 11.5–15.5)

## 2013-05-17 MED ORDER — VANCOMYCIN HCL IN DEXTROSE 1-5 GM/200ML-% IV SOLN: 1000.0000 mg | Freq: Three times a day (TID) | INTRAVENOUS | Status: DC

## 2013-05-17 MED ORDER — SODIUM CHLORIDE 0.9 % IV SOLN: 20.0000 mL | INTRAVENOUS | Status: DC

## 2013-05-17 MED ORDER — LEVOFLOXACIN IN D5W 750 MG/150ML IV SOLN: 750.0000 mg | INTRAVENOUS | Status: DC

## 2013-05-17 MED ORDER — ENOXAPARIN SODIUM 40 MG/0.4ML ~~LOC~~ SOLN: 40.0000 mg | SUBCUTANEOUS | Status: DC

## 2013-05-17 NOTE — Progress Notes (Signed)
ANTIBIOTIC CONSULT NOTE - Follow Up  Pharmacy Consult for Vancomycin Indication: vertebral osteomyelitis  Allergies  Allergen Reactions  . Darvocet (Propoxyphene-Acetaminophen) Anaphylaxis  . Penicillins Anaphylaxis and Hives  . Sulfa Antibiotics Anaphylaxis  . Toradol (Ketorolac Tromethamine) Anaphylaxis    Patient Measurements: Wt=74kg  Vital Signs: Temp: 97.9 F (36.6 C) (06/04 0703) BP: 144/60 mmHg (06/04 0703) Pulse Rate: 72 (06/04 0703) Intake/Output from previous day: 06/03 0701 - 06/04 0700 In: 480 [P.O.:480] Out: 600 [Urine:600] Intake/Output from this shift: Total I/O In: 0  Out: 400 [Urine:400]  Labs:  Recent Labs  05/14/13 1313 05/15/13 0850 05/17/13 0435  WBC 5.8 7.0 6.6  HGB 13.5 13.9 12.0*  PLT 230 194 176  CREATININE 0.50 0.65 0.67   Estimated Creatinine Clearance: 132.2 ml/min (by C-G formula based on Cr of 0.67).  Recent Labs  05/17/13 0435  VANCOTROUGH 14.6     Microbiology: Recent Results (from the past 720 hour(s))  URINE CULTURE     Status: None   Collection Time    05/10/13 11:00 AM      Result Value Range Status   Specimen Description URINE, RANDOM   Final   Special Requests NONE   Final   Culture  Setup Time 05/10/2013 12:32   Final   Colony Count NO GROWTH   Final   Culture NO GROWTH   Final   Report Status 05/11/2013 FINAL   Final  CLOSTRIDIUM DIFFICILE BY PCR     Status: None   Collection Time    05/16/13  2:22 PM      Result Value Range Status   C difficile by pcr NEGATIVE  NEGATIVE Final    Medical History: Past Medical History  Diagnosis Date  . Medical history non-contributory  04/2013    snake bite  . Medical history non-contributory     Medications:  Prescriptions prior to admission  Medication Sig Dispense Refill  . naproxen (NAPROSYN) 500 MG tablet Take 1 tablet (500 mg total) by mouth 2 (two) times daily with a meal.  60 tablet  0  . oxyCODONE (ROXICODONE) 15 MG immediate release tablet Take 1 tablet  (15 mg total) by mouth every 4 (four) hours as needed.  20 tablet  0  . polyethylene glycol (MIRALAX / GLYCOLAX) packet Take 17 g by mouth daily as needed (constipation).  14 each  0  . nicotine (NICODERM CQ - DOSED IN MG/24 HOURS) 21 mg/24hr patch Place 1 patch onto the skin daily.  28 patch  0  . ondansetron (ZOFRAN-ODT) 4 MG disintegrating tablet Take 1 tablet (4 mg total) by mouth once.  20 tablet  0  . pantoprazole (PROTONIX) 40 MG tablet Take 1 tablet (40 mg total) by mouth daily.  30 tablet  0   Assessment: 32 y/o male patient admitted with worsening back pain requiring antibiotics for vertebral osteomyelitis. Vancomycin trough is slightly less than goal but anticipate will accumulate with prolonged course of therapy.  Goal of Therapy:  Vancomycin trough level 15-20 mcg/ml  Plan:  Cont vancomycin 1g IV q8h F/u renal function and clinical course. Recheck vanc trough ~5 days   Talbert Cage, PharmD Pager [phone] 05/17/2013,10:39 AM

## 2013-05-17 NOTE — Progress Notes (Signed)
UR COMPLETED  

## 2013-05-17 NOTE — Discharge Summary (Addendum)
Physician Discharge Summary  Carl Valencia ZOX:[id number] DOB: Nov 29, 1981 DOA: 05/14/2013  PCP: No PCP Per Patient  Admit date: 05/14/2013 Discharge date: 05/23/2013  Time spent: >30 minutes  Discharge Diagnoses:  Active Problems:   Chronic narcotic dependence   Chronic back pain   Osteomyelitis of thoracic region Tobacco abuse Recent Snake bite RUE SVT  Discharge Condition: stable and improved from admission. Currently afebrile. Will transfer to [facility] hospital.   Filed Weights   05/14/13 1617  Weight: 74 kg (163 lb 2.3 oz)    History of present illness:  32 y.o. male was used to T7/8 in March 2014 osteomyelitis treated at [facility] he states he was compliant the with antibiotics for the recommended duration, chronic back pain, narcotic dependence, and hospitalized x2 here at [facility] this month for snake bite and subsequent serum sickness who presents with above complaints. He states that overnight he had a temperature 102.4 with increased upper back pain, and early this morning he had an episode of bowel incontinence x1. He also admits to numbness in both lower extremities, denies weakness. He was seen in the ED and MRI of thoracic spine show focal disc osteomyelitis at T7-8 with slight collapse of the superior endplate of T8 and focal kyphosis. MRI lumbar spine was also done and showed central disc protrusion at L4-5 with mild left lateral recess and bilateral foraminal stenosis-no evidence for additional infection within the lumbar spine.  He was started on empiric antibiotics and is admitted for further evaluation and management.   Hospital Course:  1-Osteomyelitis T7/T8 (thoracic region) -patient initially with fever up to 103 and worsening pain in his back -per ID rec's will continue vanc and levaquin; might require IV antibiotics for 4 weeks, given recurrence -per patient request and in favor of medication compliance will transfer to [facility] hospital as soon as bed is available; patient  received all his care there and has received in recent past tx for vertebral osteomyelitis -case discussed with Dr. Jonn Shingles who accepted patient in transferred.  2-Snake bite: affecting Left arm; status post antivenom in ED; no just with mild erythema; pain is almost completely resolved   3-Chronic back pain: will continue pain medications. -Neurosurgery evaluated on patient and he do not require intervention at this point. -continue IV antibiotics  4-Tobacco abuse: will continue nicotine patch. Counseling provided  5-RUE with SVT: secondary to line infiltration. No DVT on UE dopplers. Will continue conservative management, warm compresses limb elevation and DVT prophylaxis with lovenox.  Procedures: See below for x-ray reports  Consultations:  ID  neurosurgery  Discharge Exam: Filed Vitals:   05/16/13 1443 05/16/13 2220 05/17/13 0703 05/17/13 1421  BP: 138/73 140/76 144/60 133/79  Pulse: 69 75 72 76  Temp: 98.3 F (36.8 C) 98.7 F (37.1 C) 97.9 F (36.6 C) 97.6 F (36.4 C)  TempSrc:      Resp: 20 20 18 20   Height:      Weight:      SpO2: 100% 100% 100% 98%    General: Complaining of back pain; currently afebrile Cardiovascular: S1 and S2, no rubs or gallops Respiratory: CTA bilaterally Abdomen: soft, NT, ND, positive BS Extremities: Right upper extremity with swelling, and induration after line infiltration; moderate tenderness to palpation. No Lower extremity or erythema. LUE with PICC line Neuro: non focal deficit  Discharge Instructions  Discharge Orders   Future Orders Complete By Expires     Discharge instructions  As directed     Comments:  Will transferred to [facility] hospital for further evaluation and treatment.        Medication List    STOP taking these medications       naproxen 500 MG tablet  Commonly known as:  NAPROSYN      TAKE these medications       enoxaparin 40 MG/0.4ML injection  Commonly known as:  LOVENOX  Inject 0.4 mLs (40  mg total) into the skin daily.     levofloxacin 750 MG/150ML Soln  Commonly known as:  LEVAQUIN  Inject 150 mLs (750 mg total) into the vein daily.     nicotine 21 mg/24hr patch  Commonly known as:  NICODERM CQ - dosed in mg/24 hours  Place 1 patch onto the skin daily.     ondansetron 4 MG disintegrating tablet  Commonly known as:  ZOFRAN-ODT  Take 1 tablet (4 mg total) by mouth once.     oxyCODONE 15 MG immediate release tablet  Commonly known as:  ROXICODONE  Take 1 tablet (15 mg total) by mouth every 4 (four) hours as needed.     pantoprazole 40 MG tablet  Commonly known as:  PROTONIX  Take 1 tablet (40 mg total) by mouth daily.     polyethylene glycol packet  Commonly known as:  MIRALAX / GLYCOLAX  Take 17 g by mouth daily as needed (constipation).     sodium chloride 0.9 % infusion  Inject 20 mLs into the vein continuous.     vancomycin 1 GM/200ML Soln  Commonly known as:  VANCOCIN  Inject 200 mLs (1,000 mg total) into the vein every 8 (eight) hours.       Allergies  Allergen Reactions  . Darvocet (Propoxyphene-Acetaminophen) Anaphylaxis  . Penicillins Anaphylaxis and Hives  . Sulfa Antibiotics Anaphylaxis  . Toradol (Ketorolac Tromethamine) Anaphylaxis      The results of significant diagnostics from this hospitalization (including imaging, microbiology, ancillary and laboratory) are listed below for reference.    Significant Diagnostic Studies: Dg Elbow 2 Views Right  2013/05/15   *RADIOLOGY REPORT*  Clinical Data: Right elbow pain.  RIGHT ELBOW - 2 VIEW  Comparison: Earlier films, same date.  Findings: The joint spaces are maintained.  No acute fracture or joint effusion.  Stable small radiopaque foreign body noted in the ulnar sided soft tissues.  IMPRESSION: No acute bony findings or joint effusion. Stable small radiopaque foreign body.   Original Report Authenticated By: Rudie Meyer, M.D.   Dg Elbow 2 Views Right  2013/05/15   *RADIOLOGY REPORT*   Clinical Data: Fall, right elbow pain  RIGHT ELBOW - 2 VIEW  Comparison: 01/15/2010  Findings: No fracture or dislocation is seen.  5 mm radiopaque foreign body in the medial/ventral soft tissues of the proximal forearm.  The presence of an elbow joint effusion cannot be assessed due to difficulty with patient positioning.  IMPRESSION: No fracture or dislocation is seen.  5 mm radiopaque foreign body along the medial aspect of the proximal forearm.   Original Report Authenticated By: Charline Bills, M.D.   Mr Thoracic Spine W Wo Contrast  05/14/2013   *RADIOLOGY REPORT*  Clinical Data:  Osteomyelitis of the spine.  Increased pain and swelling in the same area.  The bowel incontinence.  The patient states he had similar symptoms when diagnosed with osteomyelitis in March.  There is no imaging from that time.  MRI THORACIC AND LUMBAR SPINE WITHOUT AND WITH CONTRAST  Technique:  Multiplanar and multiecho pulse sequences of the  thoracic and lumbar spine were obtained without and with intravenous contrast.  Contrast: 15mL MULTIHANCE GADOBENATE DIMEGLUMINE 529 MG/ML IV SOLN  Comparison:   None.  MRI THORACIC SPINE  Findings: Normal signal is present in the thoracic spinal cord to the conus medullaris which terminates at T12.  Abnormal marrow signal is present throughout the T7 and T8 vertebral body.  The signal abnormality extends into the pedicles bilaterally at T8. There is slight compression of the superior endplate of T8 with focal kyphosis.  Abnormal fluid signal is present within the disc space.  There is also enhancement within the disc space and focally at the prevertebral space anterior to T7 and T8.  There is no soft tissue abscess.  No significant epidural enhancement is present.  There is no epidural abscess.  There is no enhancement of the cord.  The remaining disc levels demonstrate normal signal.  There is no focal disc herniation or stenosis elsewhere.  Marrow signal at the remaining levels is normal.   IMPRESSION:  1.  Focal disc osteomyelitis at T7-8 with slight collapse of the superior endplate of T8 and focal kyphosis. 2.  Mild paravertebral enhancement without a discrete abscess. 3.  No significant epidural enhancement or cord signal abnormality.  MRI LUMBAR SPINE  Findings: Normal signal is present in the conus medullaris which terminates at T12, within normal limits.  Marrow signal, vertebral body heights, alignment are normal.  The disc levels at L3-4 above are normal.  A transitional L5 or S1 segment is evident.  The purposes of this exam, this will be labeled L5.  A central disc protrusion is evident at L4-5.  Mild facet hypertrophy contributes to mild foraminal narrowing bilaterally.  Fatty marrow infiltration is noted along the sacral hiatus to portions of L5 on the right.  The postcontrast images demonstrate no pathologic enhancement.  IMPRESSION:  1.  Central disc protrusion at L4-5 with mild left lateral recess and bilateral foraminal stenosis. 2.  Transitional L5 segment.  Please see the discussion above. 3.  No evidence for additional infection within the lumbar spine.   Original Report Authenticated By: Marin Roberts, M.D.   Mr Lumbar Spine W Wo Contrast  05/14/2013   *RADIOLOGY REPORT*  Clinical Data:  Osteomyelitis of the spine.  Increased pain and swelling in the same area.  The bowel incontinence.  The patient states he had similar symptoms when diagnosed with osteomyelitis in March.  There is no imaging from that time.  MRI THORACIC AND LUMBAR SPINE WITHOUT AND WITH CONTRAST  Technique:  Multiplanar and multiecho pulse sequences of the thoracic and lumbar spine were obtained without and with intravenous contrast.  Contrast: 15mL MULTIHANCE GADOBENATE DIMEGLUMINE 529 MG/ML IV SOLN  Comparison:   None.  MRI THORACIC SPINE  Findings: Normal signal is present in the thoracic spinal cord to the conus medullaris which terminates at T12.  Abnormal marrow signal is present throughout the T7  and T8 vertebral body.  The signal abnormality extends into the pedicles bilaterally at T8. There is slight compression of the superior endplate of T8 with focal kyphosis.  Abnormal fluid signal is present within the disc space.  There is also enhancement within the disc space and focally at the prevertebral space anterior to T7 and T8.  There is no soft tissue abscess.  No significant epidural enhancement is present.  There is no epidural abscess.  There is no enhancement of the cord.  The remaining disc levels demonstrate normal signal.  There is no  focal disc herniation or stenosis elsewhere.  Marrow signal at the remaining levels is normal.  IMPRESSION:  1.  Focal disc osteomyelitis at T7-8 with slight collapse of the superior endplate of T8 and focal kyphosis. 2.  Mild paravertebral enhancement without a discrete abscess. 3.  No significant epidural enhancement or cord signal abnormality.  MRI LUMBAR SPINE  Findings: Normal signal is present in the conus medullaris which terminates at T12, within normal limits.  Marrow signal, vertebral body heights, alignment are normal.  The disc levels at L3-4 above are normal.  A transitional L5 or S1 segment is evident.  The purposes of this exam, this will be labeled L5.  A central disc protrusion is evident at L4-5.  Mild facet hypertrophy contributes to mild foraminal narrowing bilaterally.  Fatty marrow infiltration is noted along the sacral hiatus to portions of L5 on the right.  The postcontrast images demonstrate no pathologic enhancement.  IMPRESSION:  1.  Central disc protrusion at L4-5 with mild left lateral recess and bilateral foraminal stenosis. 2.  Transitional L5 segment.  Please see the discussion above. 3.  No evidence for additional infection within the lumbar spine.   Original Report Authenticated By: Marin Roberts, M.D.   Ct Abdomen Pelvis W Contrast  05/08/2013   *RADIOLOGY REPORT*  Clinical Data: Right lower quadrant abdominal pain,  constipation  CT ABDOMEN AND PELVIS WITH CONTRAST  Technique:  Multidetector CT imaging of the abdomen and pelvis was performed following the standard protocol during bolus administration of intravenous contrast.  Contrast: OMNIPAQUE IOHEXOL 300 MG/ML  SOLN  Comparison: None.  Findings: Mild linear scarring in the left lower lobe.  Spleen is at the upper limits of normal for size, measuring 13.6 cm in craniocaudal dimension.  Pancreas and adrenal glands within normal limits.  Gallbladder unremarkable.  No intrahepatic or extrahepatic ductal dilatation.  Kidneys within normal limits.  No hydronephrosis.  No evidence of bowel obstruction.  Normal appendix.  Moderate colonic stool burden.  No evidence of abdominal aortic aneurysm.  No abdominopelvic ascites.  No suspicious abdominopelvic lymphadenopathy.  Prostate is unremarkable.  Bladder is within normal limits.  Visualized osseous structures are within normal limits.  IMPRESSION: Normal appendix.  No evidence of bowel obstruction.  Moderate colonic stool burden, suggesting constipation.  Borderline splenomegaly.   Original Report Authenticated By: Charline Bills, M.D.   Ct Shoulder Right W Contrast  05/11/2013   *RADIOLOGY REPORT*  Clinical Data: Status post snake bite.  Fever and bruising.  CT OF THE RIGHT SHOULDER WITH CONTRAST  Technique:  Multidetector CT imaging was performed following the standard protocol during bolus administration of intravenous contrast.  Contrast: OMNIPAQUE IOHEXOL 300 MG/ML  SOLN  Comparison: None.  Findings: No abscess is identified.  All visualized musculature is normal in appearance without mass or fluid collection.  No bony destructive change is seen.  There is no shoulder joint effusion. Imaged right lung is clear.  IMPRESSION: Normal study.   Original Report Authenticated By: Holley Dexter, M.D.   Dg Abd Acute W/chest  05/08/2013   *RADIOLOGY REPORT*  Clinical Data: Abdominal pain and nausea.  ACUTE ABDOMEN  SERIES (ABDOMEN 2 VIEW & CHEST 1 VIEW)  Comparison: None  Findings: The upright chest x-ray is normal.  Two views of the abdomen demonstrate a large amount of stool throughout the colon and down into the rectum suggesting constipation.  No distended small bowel loops to suggest obstruction.  No free air.  The soft tissue shadows are  maintained. No worrisome calcifications.  The bony structures are intact.  IMPRESSION:  1.  Normal chest x-ray. 2.  Large amount of stool throughout the colon suggesting constipation.   Original Report Authenticated By: Rudie Meyer, M.D.    Microbiology: Recent Results (from the past 240 hour(s))  URINE CULTURE     Status: None   Collection Time    05/10/13 11:00 AM      Result Value Range Status   Specimen Description URINE, RANDOM   Final   Special Requests NONE   Final   Culture  Setup Time 05/10/2013 12:32   Final   Colony Count NO GROWTH   Final   Culture NO GROWTH   Final   Report Status 05/11/2013 FINAL   Final  CLOSTRIDIUM DIFFICILE BY PCR     Status: None   Collection Time    05/16/13  2:22 PM      Result Value Range Status   C difficile by pcr NEGATIVE  NEGATIVE Final     Labs: Basic Metabolic Panel:  Recent Labs Lab 05/12/13 0600 05/14/13 1313 05/15/13 0850 05/17/13 0435  NA 138 137 139 136  K 4.0 3.8 4.0 3.9  CL 103 103 102 103  CO2 25 23 27 26   GLUCOSE 120* 120* 84 115*  BUN 10 11 14 14   CREATININE 0.58 0.50 0.65 0.67  CALCIUM 9.4 9.7 9.3 9.2   Liver Function Tests:  Recent Labs Lab 05/14/13 1313 05/17/13 0435  AST 15 14  ALT 12 12  ALKPHOS 101 92  BILITOT 0.7 0.3  PROT 8.5* 6.8  ALBUMIN 3.8 3.1*   CBC:  Recent Labs Lab 05/11/13 0625 05/14/13 1313 05/15/13 0850 05/17/13 0435  WBC 5.9 5.8 7.0 6.6  NEUTROABS  --  3.8  --  2.6  HGB 12.6* 13.5 13.9 12.0*  HCT 35.6* 37.1* 39.4 34.2*  MCV 85.0 83.2 84.7 83.2  PLT 174 230 194 176    Signed:  Chisa Kushner  [facility] 05/17/2013, 3:06 PM

## 2013-05-17 NOTE — Consult Note (Signed)
HPI: Carl Valencia is an 32 y.o. male with fever and back pain related to osteomyelitis. He will require home IV abx and needs PICC line. The pt is extremely anxious about having PICC line placed as he had bad experience in the past and requests sedation. IR is asked to eval for PICC under sedation. Thoroughly explained procedure as done in IR dept and that sedation is very uncommon. Pt adamantly refuses to have PICC done without sedation. He has been NPO this am. PMHx and meds reviewed.  Past Medical History:  Past Medical History  Diagnosis Date  . Medical history non-contributory  04/2013    snake bite  . Medical history non-contributory     Past Surgical History:  Past Surgical History  Procedure Laterality Date  . Exploratory laparotomy      Family History: No family history on file.  Social History:  reports that he has been smoking Cigarettes.  He has a 34 pack-year smoking history. He has never used smokeless tobacco. He reports that he does not drink alcohol or use illicit drugs.  Allergies:  Allergies  Allergen Reactions  . Darvocet (Propoxyphene-Acetaminophen) Anaphylaxis  . Penicillins Anaphylaxis and Hives  . Sulfa Antibiotics Anaphylaxis  . Toradol (Ketorolac Tromethamine) Anaphylaxis    Medications: Medications Prior to Admission  Medication Sig Dispense Refill  . naproxen (NAPROSYN) 500 MG tablet Take 1 tablet (500 mg total) by mouth 2 (two) times daily with a meal.  60 tablet  0  . oxyCODONE (ROXICODONE) 15 MG immediate release tablet Take 1 tablet (15 mg total) by mouth every 4 (four) hours as needed.  20 tablet  0  . polyethylene glycol (MIRALAX / GLYCOLAX) packet Take 17 g by mouth daily as needed (constipation).  14 each  0  . nicotine (NICODERM CQ - DOSED IN MG/24 HOURS) 21 mg/24hr patch Place 1 patch onto the skin daily.  28 patch  0  . ondansetron (ZOFRAN-ODT) 4 MG disintegrating tablet Take 1 tablet (4 mg total) by mouth once.  20 tablet  0  .  pantoprazole (PROTONIX) 40 MG tablet Take 1 tablet (40 mg total) by mouth daily.  30 tablet  0    Please HPI for pertinent positives, otherwise complete 10 system ROS negative.  Physical Exam: BP 144/60  Pulse 72  Temp(Src) 97.9 F (36.6 C) (Oral)  Resp 18  Ht 5' 8.9" (1.75 m)  Wt 163 lb 2.3 oz (74 kg)  BMI 24.16 kg/m2  SpO2 100% Body mass index is 24.16 kg/(m^2).   General Appearance:  Alert, cooperative, no distress, appears stated age  ENT: Unremarkable  Lungs:   Clear to auscultation bilaterally, no w/r/r, respirations unlabored without use of accessory muscles.  Heart:  Regular rate and rhythm, S1, S2 normal, no murmur, rub or gallop.  Extremities: Extremities normal, atraumatic, no cyanosis or edema  Pulses: 2+ and symmetric  Neurologic: Normal affect, no gross deficits.   Results for orders placed during the hospital encounter of 05/14/13 (from the past 48 hour(s))  CLOSTRIDIUM DIFFICILE BY PCR     Status: None   Collection Time    05/16/13  2:22 PM      Result Value Range   C difficile by pcr NEGATIVE  NEGATIVE  VANCOMYCIN, TROUGH     Status: None   Collection Time    05/17/13  4:35 AM      Result Value Range   Vancomycin Tr 14.6  10.0 - 20.0 ug/mL  CBC WITH DIFFERENTIAL  Status: Abnormal   Collection Time    05/17/13  4:35 AM      Result Value Range   WBC 6.6  4.0 - 10.5 K/uL   RBC 4.11 (*) 4.22 - 5.81 MIL/uL   Hemoglobin 12.0 (*) 13.0 - 17.0 g/dL   HCT 95.2 (*) 84.1 - 32.4 %   MCV 83.2  78.0 - 100.0 fL   MCH 29.2  26.0 - 34.0 pg   MCHC 35.1  30.0 - 36.0 g/dL   RDW 40.1  02.7 - 25.3 %   Platelets 176  150 - 400 K/uL   Neutrophils Relative % 39 (*) 43 - 77 %   Neutro Abs 2.6  1.7 - 7.7 K/uL   Lymphocytes Relative 42  12 - 46 %   Lymphs Abs 2.8  0.7 - 4.0 K/uL   Monocytes Relative 12  3 - 12 %   Monocytes Absolute 0.8  0.1 - 1.0 K/uL   Eosinophils Relative 7 (*) 0 - 5 %   Eosinophils Absolute 0.5  0.0 - 0.7 K/uL   Basophils Relative 0  0 - 1 %    Basophils Absolute 0.0  0.0 - 0.1 K/uL  COMPREHENSIVE METABOLIC PANEL     Status: Abnormal   Collection Time    05/17/13  4:35 AM      Result Value Range   Sodium 136  135 - 145 mEq/L   Potassium 3.9  3.5 - 5.1 mEq/L   Chloride 103  96 - 112 mEq/L   CO2 26  19 - 32 mEq/L   Glucose, Bld 115 (*) 70 - 99 mg/dL   BUN 14  6 - 23 mg/dL   Creatinine, Ser 6.64  0.50 - 1.35 mg/dL   Calcium 9.2  8.4 - 40.3 mg/dL   Total Protein 6.8  6.0 - 8.3 g/dL   Albumin 3.1 (*) 3.5 - 5.2 g/dL   AST 14  0 - 37 U/L   ALT 12  0 - 53 U/L   Alkaline Phosphatase 92  39 - 117 U/L   Total Bilirubin 0.3  0.3 - 1.2 mg/dL   GFR calc non Af Amer >90  >90 mL/min   GFR calc Af Amer >90  >90 mL/min   Comment:            The eGFR has been calculated     using the CKD EPI equation.     This calculation has not been     validated in all clinical     situations.     eGFR's persistently     <90 mL/min signify     possible Chronic Kidney Disease.   No results found.  Assessment/Plan Poor IV access Osteomyelitis T7/T8 Discussed PICC line placement procedure. Offered IV ativan or po Valium but pt again is adamant that he requests sedation for procedure. Explained sedation risks, side effects. Pt consented.  Brayton El PA-C 05/17/2013, 9:26 AM

## 2013-05-18 ENCOUNTER — Inpatient Hospital Stay (HOSPITAL_COMMUNITY)

## 2013-05-18 DIAGNOSIS — F17200 Nicotine dependence, unspecified, uncomplicated: Secondary | ICD-10-CM

## 2013-05-18 DIAGNOSIS — M869 Osteomyelitis, unspecified: Principal | ICD-10-CM

## 2013-05-18 DIAGNOSIS — R509 Fever, unspecified: Secondary | ICD-10-CM

## 2013-05-18 MED ORDER — DIPHENHYDRAMINE HCL 25 MG PO CAPS
25.0000 mg | ORAL_CAPSULE | Freq: Four times a day (QID) | ORAL | Status: DC | PRN
Start: 2013-05-18 — End: 2013-05-28
  Administered 2013-05-18 – 2013-05-27 (×26): 25 mg via ORAL
  Filled 2013-05-18 (×26): qty 1

## 2013-05-18 MED ORDER — HYDROMORPHONE HCL PF 1 MG/ML IJ SOLN
2.0000 mg | INTRAMUSCULAR | Status: DC | PRN
  Administered 2013-05-18 – 2013-05-19 (×10): 2 mg via INTRAVENOUS
  Administered 2013-05-20: 1 mg via INTRAVENOUS
  Administered 2013-05-20 (×2): 2 mg via INTRAVENOUS
  Filled 2013-05-18 (×8): qty 2
  Filled 2013-05-18: qty 1
  Filled 2013-05-18 (×4): qty 2

## 2013-05-18 MED ORDER — LEVOFLOXACIN IN D5W 750 MG/150ML IV SOLN
750.0000 mg | INTRAVENOUS | Status: DC
  Administered 2013-05-18 – 2013-05-27 (×10): 750 mg via INTRAVENOUS
  Filled 2013-05-18 (×11): qty 150

## 2013-05-18 MED ORDER — LORAZEPAM 2 MG/ML IJ SOLN
0.5000 mg | Freq: Once | INTRAMUSCULAR | Status: AC
  Administered 2013-05-18: 0.5 mg via INTRAVENOUS
  Filled 2013-05-18: qty 1

## 2013-05-18 MED ORDER — IBUPROFEN 400 MG PO TABS
400.0000 mg | ORAL_TABLET | Freq: Once | ORAL | Status: AC
  Administered 2013-05-18: 400 mg via ORAL
  Filled 2013-05-18: qty 1

## 2013-05-18 NOTE — Progress Notes (Signed)
[facility] was called to check for any updates on getting the patient transferred. They stated they were at capacity at this time, but will call [facility] as soon as a bed becomes available. [facility] Transfer Line [phone]  Arnoldo Morale RN

## 2013-05-18 NOTE — Progress Notes (Signed)
Patient seen and examined. Afebrile, stable and waiting for bed to be available at [facility] for patient to be transferred. Will continue current medication regimen and care.  Discharge summary, medications and plan unchanged.  Wilson Sample [phone]

## 2013-05-19 LAB — URINALYSIS, ROUTINE W REFLEX MICROSCOPIC
Bilirubin Urine: NEGATIVE
Glucose, UA: NEGATIVE mg/dL
Hgb urine dipstick: NEGATIVE
Protein, ur: NEGATIVE mg/dL
Specific Gravity, Urine: 1.011 (ref 1.005–1.030)

## 2013-05-19 MED ORDER — OXYCODONE HCL 5 MG PO TABS
15.0000 mg | ORAL_TABLET | ORAL | Status: DC | PRN
  Administered 2013-05-19 – 2013-05-28 (×50): 15 mg via ORAL
  Filled 2013-05-19 (×50): qty 3

## 2013-05-19 MED ORDER — LORAZEPAM 1 MG PO TABS
1.0000 mg | ORAL_TABLET | Freq: Once | ORAL | Status: AC
  Administered 2013-05-19: 1 mg via ORAL
  Filled 2013-05-19: qty 1

## 2013-05-19 NOTE — Progress Notes (Signed)
Late entry for 05/18/13. Patient spiked a temp of 102.4 within 1 hour of normal temp 98.1. Patient complained of body aches, fever and chills. Notifed MD on call. New orders for Chest X ray, blood cultures, Ibuprofen, and UA. X ray tech came up to do x ray and patient screamed, yelled and started crying c/o of severe back pain after board being placed behind back. Notifed MD on call. New order for Ativan 0.5mg  IV. No s/sx of acute distress. Patient rested throughout the night. VS WNL. Will continue to monitor.

## 2013-05-19 NOTE — Progress Notes (Signed)
Patient seen and examine. During my exam Afebrile and just complaining of intermittent back pain. Patient spike fever of 102 overnight; at that time CXR was done demonstrating just bibasilar atelectasis; UA and blood cultures where repeated and so far no growth.  Plan: -Pain medication adjusted -will continue IV levaquin and vancomycin -waiting on [facility] hospital bed availability to transfer patient for further treatment. -Discharge summary and medications essentially unchanged.  Will follow and make any adjustment or addendum as indicated.  Ellisha Bankson [phone]

## 2013-05-20 LAB — BASIC METABOLIC PANEL
BUN: 10 mg/dL (ref 6–23)
CO2: 26 mEq/L (ref 19–32)
Chloride: 100 mEq/L (ref 96–112)
Creatinine, Ser: 0.77 mg/dL (ref 0.50–1.35)

## 2013-05-20 LAB — CBC
HCT: 33.8 % — ABNORMAL LOW (ref 39.0–52.0)
MCV: 83.3 fL (ref 78.0–100.0)
RBC: 4.06 MIL/uL — ABNORMAL LOW (ref 4.22–5.81)
WBC: 7.4 10*3/uL (ref 4.0–10.5)

## 2013-05-20 MED ORDER — HYDROMORPHONE HCL PF 1 MG/ML IJ SOLN
2.0000 mg | Freq: Three times a day (TID) | INTRAMUSCULAR | Status: DC | PRN
  Administered 2013-05-20 – 2013-05-28 (×23): 2 mg via INTRAVENOUS
  Filled 2013-05-20 (×23): qty 2

## 2013-05-20 NOTE — Progress Notes (Signed)
Patient seen and examined. Has remained afebrile. Still complaining of back pain. Given hx of narcotic dependency and chronic back pain, will focus on home therapy with narcotics by mouth has he has been taking and reduce IV pain medications as much as possible. [facility] has been contacted again and still no bed available.  Discharge plans, needs and medications remains stable.  Carl Valencia [phone]

## 2013-05-21 LAB — VANCOMYCIN, TROUGH: Vancomycin Tr: 12.8 ug/mL (ref 10.0–20.0)

## 2013-05-21 LAB — SEDIMENTATION RATE: Sed Rate: 65 mm/hr — ABNORMAL HIGH (ref 0–16)

## 2013-05-21 MED ORDER — IBUPROFEN 200 MG PO TABS
200.0000 mg | ORAL_TABLET | Freq: Once | ORAL | Status: AC
  Administered 2013-05-21: 200 mg via ORAL
  Filled 2013-05-21: qty 1

## 2013-05-21 MED ORDER — VANCOMYCIN HCL 10 G IV SOLR
1250.0000 mg | Freq: Three times a day (TID) | INTRAVENOUS | Status: DC
  Administered 2013-05-21 – 2013-05-27 (×20): 1250 mg via INTRAVENOUS
  Filled 2013-05-21 (×24): qty 1250

## 2013-05-21 MED ORDER — LORAZEPAM 0.5 MG PO TABS
0.5000 mg | ORAL_TABLET | Freq: Once | ORAL | Status: AC
  Administered 2013-05-21: 0.5 mg via ORAL
  Filled 2013-05-21: qty 1

## 2013-05-21 NOTE — Progress Notes (Signed)
ANTIBIOTIC CONSULT NOTE - Follow Up  Pharmacy Consult for Vancomycin Indication: vertebral osteomyelitis  Allergies  Allergen Reactions  . Darvocet (Propoxyphene-Acetaminophen) Anaphylaxis  . Penicillins Anaphylaxis and Hives  . Sulfa Antibiotics Anaphylaxis  . Toradol (Ketorolac Tromethamine) Anaphylaxis    Patient Measurements: Wt=74kg  Vital Signs: Temp: 97.8 F (36.6 C) (06/08 0535) BP: 126/50 mmHg (06/08 0535) Pulse Rate: 86 (06/08 0535) Intake/Output from previous day: 06/07 0701 - 06/08 0700 In: 9810.8 [P.O.:600; I.V.:9210.8] Out: 2776 [Urine:2775; Stool:1] Intake/Output from this shift:    Labs:  Recent Labs  05/20/13 0405 05/21/13 0440  WBC 7.4  --   HGB 12.0*  --   PLT 198  --   CREATININE 0.77 0.82   Estimated Creatinine Clearance: 129 ml/min (by C-G formula based on Cr of 0.82).  Recent Labs  05/21/13 1112  VANCOTROUGH 12.8     Microbiology: Recent Results (from the past 720 hour(s))  URINE CULTURE     Status: None   Collection Time    05/10/13 11:00 AM      Result Value Range Status   Specimen Description URINE, RANDOM   Final   Special Requests NONE   Final   Culture  Setup Time 05/10/2013 12:32   Final   Colony Count NO GROWTH   Final   Culture NO GROWTH   Final   Report Status 05/11/2013 FINAL   Final  CLOSTRIDIUM DIFFICILE BY PCR     Status: None   Collection Time    05/16/13  2:22 PM      Result Value Range Status   C difficile by pcr NEGATIVE  NEGATIVE Final  CULTURE, BLOOD (ROUTINE X 2)     Status: None   Collection Time    05/18/13 10:15 PM      Result Value Range Status   Specimen Description BLOOD RIGHT HAND   Final   Special Requests BOTTLES DRAWN AEROBIC ONLY 5CC   Final   Culture  Setup Time 05/19/2013 05:13   Final   Culture     Final   Value:        BLOOD CULTURE RECEIVED NO GROWTH TO DATE CULTURE WILL BE HELD FOR 5 DAYS BEFORE ISSUING A FINAL NEGATIVE REPORT   Report Status PENDING   Incomplete  CULTURE, BLOOD  (ROUTINE X 2)     Status: None   Collection Time    05/18/13 10:25 PM      Result Value Range Status   Specimen Description BLOOD LEFT HAND   Final   Special Requests BOTTLES DRAWN AEROBIC ONLY 5CC   Final   Culture  Setup Time 05/19/2013 05:12   Final   Culture     Final   Value:        BLOOD CULTURE RECEIVED NO GROWTH TO DATE CULTURE WILL BE HELD FOR 5 DAYS BEFORE ISSUING A FINAL NEGATIVE REPORT   Report Status PENDING   Incomplete    Medical History: Past Medical History  Diagnosis Date  . Medical history non-contributory  04/2013    snake bite  . Medical history non-contributory     Medications:  Prescriptions prior to admission  Medication Sig Dispense Refill  . oxyCODONE (ROXICODONE) 15 MG immediate release tablet Take 1 tablet (15 mg total) by mouth every 4 (four) hours as needed.  20 tablet  0  . polyethylene glycol (MIRALAX / GLYCOLAX) packet Take 17 g by mouth daily as needed (constipation).  14 each  0  . [DISCONTINUED] naproxen (NAPROSYN)  500 MG tablet Take 1 tablet (500 mg total) by mouth 2 (two) times daily with a meal.  60 tablet  0  . nicotine (NICODERM CQ - DOSED IN MG/24 HOURS) 21 mg/24hr patch Place 1 patch onto the skin daily.  28 patch  0  . ondansetron (ZOFRAN-ODT) 4 MG disintegrating tablet Take 1 tablet (4 mg total) by mouth once.  20 tablet  0  . pantoprazole (PROTONIX) 40 MG tablet Take 1 tablet (40 mg total) by mouth daily.  30 tablet  0   Assessment: 32 y/o male patient on IV Vancomycin 1000 mg IV q8h for vertebral osteomyelitis. Vancomycin trough is 12.8 mcg/ml,  less than goal 15-20 mcg/ml.  All doses charted as given on schedule. SCr = 0.77, CrCl ~ >129ml/min.  Will increase vancomycin to target goal trough 15-20 mcg/ml.  Goal of Therapy:  Vancomycin trough level 15-20 mcg/ml  Plan:  Increase vancomycin to 1250 mg IV q8h F/u renal function and clinical course. Recheck vanc trough ~3-5 days  Noah Delaine, RPh Clinical Pharmacist Pager:  [phone] 05/21/2013,12:49 PM

## 2013-05-21 NOTE — Plan of Care (Signed)
Problem: Phase II Progression Outcomes Goal: Progress activity as tolerated unless otherwise ordered Outcome: Completed/Met Date Met:  05/21/13 Ambulating independently in halls Goal: IV changed to normal saline lock Outcome: Not Met (add Reason) Still receiving IV antibiotics, IVF changed to [facility]

## 2013-05-21 NOTE — Progress Notes (Signed)
Patient seen and examined. Spike fever again up to 102 overnight. He also had IV line infiltrated and is complaining of back pain. [facility] hospital still w/o available bed, but patient is top on the list for transfers according to communication.  Plan: -will continue IV antibiotics -Continue supportive care and PRN antipyretics -blood cx's w/o growth -will use warm compresses to affected limb (infiltrated line) and follow clinical response   Azure Budnick [phone]

## 2013-05-22 MED ORDER — HYDROMORPHONE HCL PF 1 MG/ML IJ SOLN
1.0000 mg | Freq: Once | INTRAMUSCULAR | Status: AC
  Administered 2013-05-22: 1 mg via INTRAVENOUS
  Filled 2013-05-22: qty 1

## 2013-05-22 MED ORDER — LORAZEPAM BOLUS VIA INFUSION
0.5000 mg | Freq: Once | INTRAVENOUS | Status: DC
  Filled 2013-05-22: qty 1

## 2013-05-22 NOTE — Progress Notes (Signed)
[facility] PROGRESS NOTE  Carl Valencia GNF:[id number] DOB: [birth year] DOA: 05/14/2013 PCP: No PCP Per Patient  Assessment/Plan: 1-Osteomyelitis T7/T8 (thoracic region)  -back pain continue to be an issue, but overall control with current medications -per ID rec's will continue vanc and levaquin; might require IV antibiotics for 4 weeks, given recurrence of infection -per patient request and in favor of medication compliance will transfer to [facility] hospital; patient received all his care there and has received in recent past tx for vertebral osteomyelitis as well -case discussed with Dr. Jonn Shingles who accepted patient in transferred.  -waiting bed availability at [facility] at this point -patient with intermittent episodes of fever; work up including CXR, blood cx and UA negative.  2-Snake bite: affecting Left arm; status post antivenom in ED; no just with mild erythema; pain is almost completely resolved   3-Chronic back pain: will continue pain medications.  -Neurosurgery evaluated on patient and he do not require intervention at this point.  -continue IV antibiotics   4-Tobacco abuse: will continue nicotine patch. Counseling provided  5-RUE IV line infiltration: tender and indurated; will get [place] to r/o any clot in his vein. Continue warm compresses and limb elevation.  Code Status: Full Family Communication:no family at bedside Disposition Plan: Transfer to [facility] when bed available   Consultants:  Neurosurgery  ID curbside   Procedures:  See below for x-ray reports  Antibiotics:  vanc and levaquin  HPI/Subjective: Afebrile, complaining of pain on his RUE and back, no SOB or CP  Objective: Filed Vitals:   05/21/13 0130 05/21/13 0535 05/21/13 2300 05/22/13 0605  BP:  126/50 169/55 143/58  Pulse:  86 71 88  Temp: 100.4 F (38 C) 97.8 F (36.6 C) 99.5 F (37.5 C) 100.1 F (37.8 C)  TempSrc:      Resp:  18 18 18   Height:      Weight:      SpO2:  100% 100% 98%     Intake/Output Summary (Last 24 hours) at 05/22/13 1145 Last data filed at 05/22/13 0605  Gross per 24 hour  Intake 2971.42 ml  Output      0 ml  Net 2971.42 ml   Filed Weights   05/14/13 1617  Weight: 74 kg (163 lb 2.3 oz)    Exam:   General:  Afebrile, complaining of pain on RUE  Cardiovascular: S1 and S2, no rubs, no gallops, no murmurs  Respiratory: CTA bilaterally  Abdomen: soft, NT, ND, positive BS  Musculoskeletal: RUE with swelling and induration in the area where IV line was located; good pulse and capillary filling  Data Reviewed: Basic Metabolic Panel:  Recent Labs Lab 05/17/13 0435 05/20/13 0405 05/21/13 0440  NA 136 136  --   K 3.9 4.1  --   CL 103 100  --   CO2 26 26  --   GLUCOSE 115* 102*  --   BUN 14 10  --   CREATININE 0.67 0.77 0.82  CALCIUM 9.2 9.5  --    Liver Function Tests:  Recent Labs Lab 05/17/13 0435  AST 14  ALT 12  ALKPHOS 92  BILITOT 0.3  PROT 6.8  ALBUMIN 3.1*   CBC:  Recent Labs Lab 05/17/13 0435 05/20/13 0405  WBC 6.6 7.4  NEUTROABS 2.6  --   HGB 12.0* 12.0*  HCT 34.2* 33.8*  MCV 83.2 83.3  PLT 176 198   BNP (last 3 results) No results found for this basename: PROBNP,  in the last 8760 hours  CBG: No results found for this basename: GLUCAP,  in the last 168 hours  Recent Results (from the past 240 hour(s))  CLOSTRIDIUM DIFFICILE BY PCR     Status: None   Collection Time    05/16/13  2:22 PM      Result Value Range Status   C difficile by pcr NEGATIVE  NEGATIVE Final  CULTURE, BLOOD (ROUTINE X 2)     Status: None   Collection Time    05/18/13 10:15 PM      Result Value Range Status   Specimen Description BLOOD RIGHT HAND   Final   Special Requests BOTTLES DRAWN AEROBIC ONLY 5CC   Final   Culture  Setup Time 05/19/2013 05:13   Final   Culture     Final   Value:        BLOOD CULTURE RECEIVED NO GROWTH TO DATE CULTURE WILL BE HELD FOR 5 DAYS BEFORE ISSUING A FINAL NEGATIVE REPORT   Report Status  PENDING   Incomplete  CULTURE, BLOOD (ROUTINE X 2)     Status: None   Collection Time    05/18/13 10:25 PM      Result Value Range Status   Specimen Description BLOOD LEFT HAND   Final   Special Requests BOTTLES DRAWN AEROBIC ONLY 5CC   Final   Culture  Setup Time 05/19/2013 05:12   Final   Culture     Final   Value:        BLOOD CULTURE RECEIVED NO GROWTH TO DATE CULTURE WILL BE HELD FOR 5 DAYS BEFORE ISSUING A FINAL NEGATIVE REPORT   Report Status PENDING   Incomplete     Studies: No results found.  Scheduled Meds: . enoxaparin (LOVENOX) injection  40 mg Subcutaneous Q24H  . levofloxacin (LEVAQUIN) IV  750 mg Intravenous Q24H  . naproxen  500 mg Oral BID WC  . nicotine  21 mg Transdermal Daily  . pantoprazole  40 mg Oral Daily  . vancomycin  1,250 mg Intravenous Q8H   Continuous Infusions: . sodium chloride 20 mL/hr at 05/21/13 1807    Active Problems:   Chronic narcotic dependence   Chronic back pain   Osteomyelitis of thoracic region    Time spent: < 30 minutes   Marlei Glomski  [facility] Pager [phone]. If 7PM-7AM, please contact night-coverage at [email or web address], password [facility] 05/22/2013, 11:45 AM  LOS: 8 days

## 2013-05-23 ENCOUNTER — Inpatient Hospital Stay (HOSPITAL_COMMUNITY)

## 2013-05-23 MED ORDER — LORAZEPAM 2 MG/ML IJ SOLN
0.5000 mg | Freq: Once | INTRAMUSCULAR | Status: AC
  Administered 2013-05-23: 0.5 mg via INTRAVENOUS

## 2013-05-23 MED ORDER — LORAZEPAM 2 MG/ML IJ SOLN
INTRAMUSCULAR | Status: AC
  Filled 2013-05-23: qty 1

## 2013-05-23 MED ORDER — SODIUM CHLORIDE 0.9 % IJ SOLN: 10.0000 mL | INTRAMUSCULAR | Status: DC | PRN

## 2013-05-23 NOTE — Progress Notes (Signed)
[facility] PROGRESS NOTE  Carl Valencia ZOX:[id number] DOB: June 20, 1981 DOA: 05/14/2013 PCP: No PCP Per Patient  Assessment/Plan: 1-Osteomyelitis T7/T8 (thoracic region)  -back pain continue to be an issue, but overall control with current medications -per ID rec's will continue vanc and levaquin; might require IV antibiotics for 4 weeks, given recurrence of infection -per patient request and in favor of medication compliance will transfer to [facility] hospital; patient received all his care there and has received in recent past tx for vertebral osteomyelitis as well -case discussed with Dr. Jonn Shingles who accepted patient in transferred.  -Still waiting bed availability at [facility] at this point -patient with intermittent episodes of fever; work up including CXR, blood cx and UA negative.  2-Snake bite: affecting Left arm; status post antivenom in ED; no just with mild erythema; pain is almost completely resolved   3-Chronic back pain: will continue pain medications.  -Neurosurgery evaluated on patient and he do not require intervention at this point.  -continue IV antibiotics  -PICC line has been place to make easier administration of medications and blood work.  4-Tobacco abuse: will continue nicotine patch. Counseling provided  5-RUE IV line infiltration: tender and indurated; dopplers consistently with SVT, but no DVT's. Will Continue warm compresses and limb elevation. Patient on lovenox for DVT prophylaxis.  Code Status: Full Family Communication:no family at bedside Disposition Plan: Transfer to [facility] when bed available   Consultants:  Neurosurgery  ID curbside   Procedures:  See below for x-ray reports  Antibiotics:  vanc and levaquin  HPI/Subjective: Afebrile, complaining of pain on his RUE and back, no SOB or CP. His RUE is less swollen and indurated today.  Objective: Filed Vitals:   05/22/13 0605 05/22/13 1300 05/22/13 2158 05/23/13 1300  BP: 143/58 129/59 135/74  155/61  Pulse: 88 79 75 59  Temp: 100.1 F (37.8 C) 98 F (36.7 C) 97.8 F (36.6 C) 98.6 F (37 C)  TempSrc:      Resp: 18 18 18 18   Height:      Weight:      SpO2: 98% 100% 100% 100%    Intake/Output Summary (Last 24 hours) at 05/23/13 1702 Last data filed at 05/23/13 0650  Gross per 24 hour  Intake    840 ml  Output      0 ml  Net    840 ml   Filed Weights   05/14/13 1617  Weight: 74 kg (163 lb 2.3 oz)    Exam:   General:  Afebrile, complaining of pain on RUE and abck  Cardiovascular: S1 and S2, no rubs, no gallops, no murmurs  Respiratory: CTA bilaterally  Abdomen: soft, NT, ND, positive BS  Musculoskeletal: RUE with swelling and induration in the area where IV line was located; good pulse and capillary filling  Data Reviewed: Basic Metabolic Panel:  Recent Labs Lab 05/17/13 0435 05/20/13 0405 05/21/13 0440  NA 136 136  --   K 3.9 4.1  --   CL 103 100  --   CO2 26 26  --   GLUCOSE 115* 102*  --   BUN 14 10  --   CREATININE 0.67 0.77 0.82  CALCIUM 9.2 9.5  --    Liver Function Tests:  Recent Labs Lab 05/17/13 0435  AST 14  ALT 12  ALKPHOS 92  BILITOT 0.3  PROT 6.8  ALBUMIN 3.1*   CBC:  Recent Labs Lab 05/17/13 0435 05/20/13 0405  WBC 6.6 7.4  NEUTROABS 2.6  --  HGB 12.0* 12.0*  HCT 34.2* 33.8*  MCV 83.2 83.3  PLT 176 198    Recent Results (from the past 240 hour(s))  CLOSTRIDIUM DIFFICILE BY PCR     Status: None   Collection Time    05/16/13  2:22 PM      Result Value Range Status   C difficile by pcr NEGATIVE  NEGATIVE Final  CULTURE, BLOOD (ROUTINE X 2)     Status: None   Collection Time    05/18/13 10:15 PM      Result Value Range Status   Specimen Description BLOOD RIGHT HAND   Final   Special Requests BOTTLES DRAWN AEROBIC ONLY 5CC   Final   Culture  Setup Time 05/19/2013 05:13   Final   Culture     Final   Value:        BLOOD CULTURE RECEIVED NO GROWTH TO DATE CULTURE WILL BE HELD FOR 5 DAYS BEFORE ISSUING A  FINAL NEGATIVE REPORT   Report Status PENDING   Incomplete  CULTURE, BLOOD (ROUTINE X 2)     Status: None   Collection Time    05/18/13 10:25 PM      Result Value Range Status   Specimen Description BLOOD LEFT HAND   Final   Special Requests BOTTLES DRAWN AEROBIC ONLY 5CC   Final   Culture  Setup Time 05/19/2013 05:12   Final   Culture     Final   Value:        BLOOD CULTURE RECEIVED NO GROWTH TO DATE CULTURE WILL BE HELD FOR 5 DAYS BEFORE ISSUING A FINAL NEGATIVE REPORT   Report Status PENDING   Incomplete     Studies: No results found.  Scheduled Meds: . enoxaparin (LOVENOX) injection  40 mg Subcutaneous Q24H  . levofloxacin (LEVAQUIN) IV  750 mg Intravenous Q24H  . LORazepam      . naproxen  500 mg Oral BID WC  . nicotine  21 mg Transdermal Daily  . pantoprazole  40 mg Oral Daily  . vancomycin  1,250 mg Intravenous Q8H   Continuous Infusions: . sodium chloride 20 mL/hr at 05/21/13 1807    Active Problems:   Chronic narcotic dependence   Chronic back pain   Osteomyelitis of thoracic region    Time spent: < 30 minutes   Carl Valencia  [facility] Pager [phone]. If 7PM-7AM, please contact night-coverage at [email or web address], password [facility] 05/23/2013, 5:02 PM  LOS: 9 days

## 2013-05-23 NOTE — Progress Notes (Signed)
VASCULAR LAB PRELIMINARY  PRELIMINARY  PRELIMINARY  PRELIMINARY  Right upper extremity venous duplex completed.    Preliminary report:  Superficial thrombosis noted in the cephalic vein from the antecubitus to the shoulder.  Does not propagate into the subclavian vein.  No evidence of deep vein thrombosis.     Eddison Searls, RVT 05/23/2013, 10:55 AM

## 2013-05-23 NOTE — Procedures (Signed)
L arm PICC No complication No blood loss. See complete dictation in [facility].

## 2013-05-23 NOTE — Progress Notes (Signed)
Peripherally Inserted Central Catheter/Midline Placement  The IV Nurse has discussed with the patient and/or persons authorized to consent for the patient, the purpose of this procedure and the potential benefits and risks involved with this procedure.  The benefits include less needle sticks, lab draws from the catheter and patient may be discharged home with the catheter.  Risks include, but not limited to, infection, bleeding, blood clot (thrombus formation), and puncture of an artery; nerve damage and irregular heat beat.  Alternatives to this procedure were also discussed.  PICC/Midline Placement Documentation        Lisabeth Devoid 05/23/2013, 12:13 PM

## 2013-05-23 NOTE — Progress Notes (Signed)
Attempted insertion of left arm PICC via brachial vein without success.  Unable to thread guidewire.  Assessed cephalic vein however it was very small.  No basilic vein was visualized.  Pt tolerated procedure after Ativan without problems.   Primary RN made aware.   Referred to Interventional Radiology spoke with [facility] and gave her the information.

## 2013-05-24 LAB — BASIC METABOLIC PANEL
CO2: 26 mEq/L (ref 19–32)
Calcium: 9.1 mg/dL (ref 8.4–10.5)
Chloride: 103 mEq/L (ref 96–112)
Creatinine, Ser: 0.73 mg/dL (ref 0.50–1.35)
Glucose, Bld: 96 mg/dL (ref 70–99)
Potassium: 3.9 mEq/L (ref 3.5–5.1)

## 2013-05-24 LAB — CBC
HCT: 32.6 % — ABNORMAL LOW (ref 39.0–52.0)
MCHC: 34.7 g/dL (ref 30.0–36.0)
Platelets: 209 10*3/uL (ref 150–400)
RDW: 12.4 % (ref 11.5–15.5)
WBC: 6.8 10*3/uL (ref 4.0–10.5)

## 2013-05-24 LAB — VANCOMYCIN, TROUGH: Vancomycin Tr: 17.9 ug/mL (ref 10.0–20.0)

## 2013-05-24 NOTE — Progress Notes (Signed)
[facility] PROGRESS NOTE  Carl Valencia JXB:[id number] DOB: 1981/06/13 DOA: 05/14/2013 PCP: No PCP Per Patient  Assessment/Plan: 1-Osteomyelitis T7/T8 (thoracic region)  -back pain continue to be an issue, but overall control with current medications  -per ID rec's will continue vanc and levaquin; might require IV antibiotics for 4 weeks, given recurrence of infection  -per patient request and in favor of medication compliance will transfer to [facility] hospital; patient received all his care there and has received in recent past tx for vertebral osteomyelitis as well  -case was discussed with Dr. Jonn Shingles who accepted patient in transfer  -Awaiting bed availability at [facility] -patient with intermittent episodes of fever; work up including CXR, blood cx and UA negative.  2-Snake bite: affecting Left arm; status post antivenom in ED; no just with mild erythema; pain is almost completely resolved  3-Chronic back pain: will continue pain medications.  -Neurosurgery evaluated on patient and he do not require intervention at this point.  -continue IV antibiotics  -PICC line has been place to make easier administration of medications and blood work.  4-Tobacco abuse: will continue nicotine patch. Counseling provided  5-RUE IV line infiltration: tender and indurated; dopplers consistently with SVT, but no DVT's. Will Continue warm compresses and limb elevation. Patient on lovenox for DVT prophylaxis.   Code Status: Full Family Communication: Pt in room (indicate person spoken with, relationship, and if by phone, the number) Disposition Plan: Pending transfer to [facility]   Consultants:  Neurosurgery  Procedures:  PICC Placement on 05/23/13  Antibiotics:  Vanc 05/14/13>>>  Levaquin 05/14/13>>>  HPI/Subjective: Pt without events overnight  Objective: Filed Vitals:   05/22/13 1300 05/22/13 2158 05/23/13 1300 05/23/13 2145  BP: 129/59 135/74 155/61 138/61  Pulse: 79 75 59 64  Temp:   98.6 F  (37 C) 98.5 F (36.9 C)  TempSrc:      Resp: 18 18 18 16   Height:      Weight:      SpO2: 100% 100% 100% 100%   No intake or output data in the 24 hours ending 05/24/13 0827 Filed Weights   05/14/13 1617  Weight: 74 kg (163 lb 2.3 oz)    Exam:   General:  Asleep, in nad  Cardiovascular: regular, s1, s2  Respiratory: Normal resp effort  Abdomen: Soft, nondistended  Musculoskeletal: Perfused, no clubbing   Data Reviewed: Basic Metabolic Panel:  Recent Labs Lab 05/20/13 0405 05/21/13 0440 05/24/13 0505  NA 136  --  139  K 4.1  --  3.9  CL 100  --  103  CO2 26  --  26  GLUCOSE 102*  --  96  BUN 10  --  11  CREATININE 0.77 0.82 0.73  CALCIUM 9.5  --  9.1   Liver Function Tests: No results found for this basename: AST, ALT, ALKPHOS, BILITOT, PROT, ALBUMIN,  in the last 168 hours No results found for this basename: LIPASE, AMYLASE,  in the last 168 hours No results found for this basename: AMMONIA,  in the last 168 hours CBC:  Recent Labs Lab 05/20/13 0405 05/24/13 0505  WBC 7.4 6.8  HGB 12.0* 11.3*  HCT 33.8* 32.6*  MCV 83.3 81.7  PLT 198 209   Cardiac Enzymes: No results found for this basename: CKTOTAL, CKMB, CKMBINDEX, TROPONINI,  in the last 168 hours BNP (last 3 results) No results found for this basename: PROBNP,  in the last 8760 hours CBG: No results found for this basename: GLUCAP,  in the  last 168 hours  Recent Results (from the past 240 hour(s))  CLOSTRIDIUM DIFFICILE BY PCR     Status: None   Collection Time    05/16/13  2:22 PM      Result Value Range Status   C difficile by pcr NEGATIVE  NEGATIVE Final  CULTURE, BLOOD (ROUTINE X 2)     Status: None   Collection Time    05/18/13 10:15 PM      Result Value Range Status   Specimen Description BLOOD RIGHT HAND   Final   Special Requests BOTTLES DRAWN AEROBIC ONLY 5CC   Final   Culture  Setup Time 05/19/2013 05:13   Final   Culture     Final   Value:        BLOOD CULTURE RECEIVED NO  GROWTH TO DATE CULTURE WILL BE HELD FOR 5 DAYS BEFORE ISSUING A FINAL NEGATIVE REPORT   Report Status PENDING   Incomplete  CULTURE, BLOOD (ROUTINE X 2)     Status: None   Collection Time    05/18/13 10:25 PM      Result Value Range Status   Specimen Description BLOOD LEFT HAND   Final   Special Requests BOTTLES DRAWN AEROBIC ONLY 5CC   Final   Culture  Setup Time 05/19/2013 05:12   Final   Culture     Final   Value:        BLOOD CULTURE RECEIVED NO GROWTH TO DATE CULTURE WILL BE HELD FOR 5 DAYS BEFORE ISSUING A FINAL NEGATIVE REPORT   Report Status PENDING   Incomplete     Studies: No results found.  Scheduled Meds: . enoxaparin (LOVENOX) injection  40 mg Subcutaneous Q24H  . levofloxacin (LEVAQUIN) IV  750 mg Intravenous Q24H  . naproxen  500 mg Oral BID WC  . nicotine  21 mg Transdermal Daily  . pantoprazole  40 mg Oral Daily  . vancomycin  1,250 mg Intravenous Q8H   Continuous Infusions: . sodium chloride 20 mL/hr at 05/21/13 1807    Active Problems:   Chronic narcotic dependence   Chronic back pain   Osteomyelitis of thoracic region    Time spent:    Carl Valencia K  [facility] Pager [phone]. If 7PM-7AM, please contact night-coverage at [email or web address], password [facility] 05/24/2013, 8:27 AM  LOS: 10 days

## 2013-05-24 NOTE — Progress Notes (Signed)
ANTIBIOTIC CONSULT NOTE - Follow Up  Pharmacy Consult for Vancomycin Indication: vertebral osteomyelitis  Allergies  Allergen Reactions  . Darvocet (Propoxyphene-Acetaminophen) Anaphylaxis  . Penicillins Anaphylaxis and Hives  . Sulfa Antibiotics Anaphylaxis  . Toradol (Ketorolac Tromethamine) Anaphylaxis  . Tylenol (Acetaminophen) Other (See Comments)    Patient has sever liver damage, will and should not take tylenol    Patient Measurements: Wt 74 kg  Vital Signs: Temp: 98.5 F (36.9 C) (06/10 2145) BP: 138/61 mmHg (06/10 2145) Pulse Rate: 64 (06/10 2145) Intake/Output from previous day:   Intake/Output from this shift:    Labs:  Recent Labs  05/24/13 0505  WBC 6.8  HGB 11.3*  PLT 209  CREATININE 0.73   Estimated Creatinine Clearance: 132.2 ml/min (by C-G formula based on Cr of 0.73).  Recent Labs  05/21/13 1112 05/24/13 0505  VANCOTROUGH 12.8 17.9     Microbiology: Recent Results (from the past 720 hour(s))  URINE CULTURE     Status: None   Collection Time    05/10/13 11:00 AM      Result Value Range Status   Specimen Description URINE, RANDOM   Final   Special Requests NONE   Final   Culture  Setup Time 05/10/2013 12:32   Final   Colony Count NO GROWTH   Final   Culture NO GROWTH   Final   Report Status 05/11/2013 FINAL   Final  CLOSTRIDIUM DIFFICILE BY PCR     Status: None   Collection Time    05/16/13  2:22 PM      Result Value Range Status   C difficile by pcr NEGATIVE  NEGATIVE Final  CULTURE, BLOOD (ROUTINE X 2)     Status: None   Collection Time    05/18/13 10:15 PM      Result Value Range Status   Specimen Description BLOOD RIGHT HAND   Final   Special Requests BOTTLES DRAWN AEROBIC ONLY 5CC   Final   Culture  Setup Time 05/19/2013 05:13   Final   Culture     Final   Value:        BLOOD CULTURE RECEIVED NO GROWTH TO DATE CULTURE WILL BE HELD FOR 5 DAYS BEFORE ISSUING A FINAL NEGATIVE REPORT   Report Status PENDING   Incomplete   CULTURE, BLOOD (ROUTINE X 2)     Status: None   Collection Time    05/18/13 10:25 PM      Result Value Range Status   Specimen Description BLOOD LEFT HAND   Final   Special Requests BOTTLES DRAWN AEROBIC ONLY 5CC   Final   Culture  Setup Time 05/19/2013 05:12   Final   Culture     Final   Value:        BLOOD CULTURE RECEIVED NO GROWTH TO DATE CULTURE WILL BE HELD FOR 5 DAYS BEFORE ISSUING A FINAL NEGATIVE REPORT   Report Status PENDING   Incomplete    Medical History: Past Medical History  Diagnosis Date  . Medical history non-contributory  04/2013    snake bite  . Medical history non-contributory     Medications:  Prescriptions prior to admission  Medication Sig Dispense Refill  . oxyCODONE (ROXICODONE) 15 MG immediate release tablet Take 1 tablet (15 mg total) by mouth every 4 (four) hours as needed.  20 tablet  0  . polyethylene glycol (MIRALAX / GLYCOLAX) packet Take 17 g by mouth daily as needed (constipation).  14 each  0  . [  DISCONTINUED] naproxen (NAPROSYN) 500 MG tablet Take 1 tablet (500 mg total) by mouth 2 (two) times daily with a meal.  60 tablet  0  . nicotine (NICODERM CQ - DOSED IN MG/24 HOURS) 21 mg/24hr patch Place 1 patch onto the skin daily.  28 patch  0  . ondansetron (ZOFRAN-ODT) 4 MG disintegrating tablet Take 1 tablet (4 mg total) by mouth once.  20 tablet  0  . pantoprazole (PROTONIX) 40 MG tablet Take 1 tablet (40 mg total) by mouth daily.  30 tablet  0   Assessment: 32 y/o male patient on day 11 vancomycin for vertebral osteomyelitis. Plan is for 4 weeks of vancomycin and Levaquin. Vancomycin trough is 17.9 mcg/ml and at goal of 15-20 mcg/ml. Renal function is stable. Will need to watch vancomycin closely on q8h dosing which may require more frequent vancomycin troughs. Patient will transfer to [facility] once bed available.  Antibiotics: Vanc 6/1 >> LVQ 6/1 >> 6/2, resumed 6/5 >>  6/4 VT = 14.6 mcg/mL 6/8 VT = 12.8 mcg/mL  Cultures: 6/5 blood cx -  NGTD  Goal of Therapy:  Vancomycin trough level 15-20 mcg/ml  Plan:  -Continue vancomycin 1250 mg IV q8h -F/u renal function and clinical course. -Recheck vancomycin trough ~3-5 days  [facility], [address].D., BCPS Clinical Pharmacist Pager: [phone] 05/24/2013 8:52 AM

## 2013-05-25 LAB — CULTURE, BLOOD (ROUTINE X 2): Culture: NO GROWTH

## 2013-05-25 MED ORDER — SODIUM CHLORIDE 0.9 % IJ SOLN: 10.0000 mL | Freq: Two times a day (BID) | INTRAMUSCULAR | Status: DC

## 2013-05-25 MED ORDER — SODIUM CHLORIDE 0.9 % IJ SOLN
10.0000 mL | INTRAMUSCULAR | Status: DC | PRN
  Administered 2013-05-26: 10 mL
  Administered 2013-05-27 – 2013-05-28 (×3): 20 mL

## 2013-05-25 NOTE — Progress Notes (Signed)
[facility] PROGRESS NOTE  Carl Valencia MVH:[id number] DOB: [birth year] DOA: 05/14/2013 PCP: No PCP Per Patient  Assessment/Plan: 1-Osteomyelitis T7/T8 (thoracic region)  -back pain continue to be an issue, but overall control with current medications  -per ID rec's will continue vanc and levaquin; might require IV antibiotics for 4 weeks, given recurrence of infection  -per patient request and in favor of medication compliance will transfer to [facility] hospital; patient received all his care there and has received in recent past tx for vertebral osteomyelitis as well  -case was discussed with Dr. Jonn Shingles who accepted patient in transfer  -Still awaiting bed availability at [facility]  -patient with intermittent episodes of fever; work up including CXR, blood cx and UA negative.  -Afebrile over 24hrs 2-Snake bite: affecting Left arm; status post antivenom in ED; mild erythema; pain is almost completely resolved  3-Chronic back pain: will continue pain medications.  -Neurosurgery evaluated on patient and he do not require intervention at this point.  -continue IV antibiotics  -PICC line has been place to make easier administration of medications and blood work.  4-Tobacco abuse: will continue nicotine patch. Counseling provided  5-RUE IV line   Code Status: Full Family Communication: Pt in room (indicate person spoken with, relationship, and if by phone, the number) Disposition Plan: Awaiting transfer to [facility]   Consultants:  Neurosurgery  Procedures:  PICC placement on 05/23/13  Antibiotics: Vanc 05/14/13>>> Levaquin 05/14/13>>>   HPI/Subjective: No overnight events noted.  Objective: Filed Vitals:   05/23/13 1300 05/23/13 2145 05/24/13 1433 05/24/13 2244  BP: 155/61 138/61 132/55 138/67  Pulse: 59 64 77 58  Temp: 98.6 F (37 C) 98.5 F (36.9 C) 98.6 F (37 C) 98.5 F (36.9 C)  TempSrc:      Resp: 18 16 18 18   Height:      Weight:      SpO2: 100% 100% 100% 100%     Intake/Output Summary (Last 24 hours) at 05/25/13 1203 Last data filed at 05/24/13 2245  Gross per 24 hour  Intake    960 ml  Output    600 ml  Net    360 ml   Filed Weights   05/14/13 1617  Weight: 74 kg (163 lb 2.3 oz)    Exam:   General:  Asleep, easily awakened, in NAD  Cardiovascular: Regular, s1, s2  Respiratory: Normal resp effort, no crackles or wheezing  Abdomen: soft, nontender  Musculoskeletal: perfused, no clubbing   Data Reviewed: Basic Metabolic Panel:  Recent Labs Lab 05/20/13 0405 05/21/13 0440 05/24/13 0505  NA 136  --  139  K 4.1  --  3.9  CL 100  --  103  CO2 26  --  26  GLUCOSE 102*  --  96  BUN 10  --  11  CREATININE 0.77 0.82 0.73  CALCIUM 9.5  --  9.1   Liver Function Tests: No results found for this basename: AST, ALT, ALKPHOS, BILITOT, PROT, ALBUMIN,  in the last 168 hours No results found for this basename: LIPASE, AMYLASE,  in the last 168 hours No results found for this basename: AMMONIA,  in the last 168 hours CBC:  Recent Labs Lab 05/20/13 0405 05/24/13 0505  WBC 7.4 6.8  HGB 12.0* 11.3*  HCT 33.8* 32.6*  MCV 83.3 81.7  PLT 198 209   Cardiac Enzymes: No results found for this basename: CKTOTAL, CKMB, CKMBINDEX, TROPONINI,  in the last 168 hours BNP (last 3 results) No results found for  this basename: PROBNP,  in the last 8760 hours CBG: No results found for this basename: GLUCAP,  in the last 168 hours  Recent Results (from the past 240 hour(s))  CLOSTRIDIUM DIFFICILE BY PCR     Status: None   Collection Time    05/16/13  2:22 PM      Result Value Range Status   C difficile by pcr NEGATIVE  NEGATIVE Final  CULTURE, BLOOD (ROUTINE X 2)     Status: None   Collection Time    05/18/13 10:15 PM      Result Value Range Status   Specimen Description BLOOD RIGHT HAND   Final   Special Requests BOTTLES DRAWN AEROBIC ONLY 5CC   Final   Culture  Setup Time 05/19/2013 05:13   Final   Culture NO GROWTH 5 DAYS   Final    Report Status 05/25/2013 FINAL   Final  CULTURE, BLOOD (ROUTINE X 2)     Status: None   Collection Time    05/18/13 10:25 PM      Result Value Range Status   Specimen Description BLOOD LEFT HAND   Final   Special Requests BOTTLES DRAWN AEROBIC ONLY 5CC   Final   Culture  Setup Time 05/19/2013 05:12   Final   Culture NO GROWTH 5 DAYS   Final   Report Status 05/25/2013 FINAL   Final     Studies: Ir Fluoro Guide Cv Line Left  05/24/2013   *RADIOLOGY REPORT*  Clinical data: Osteomyelitis, needs access for antibiotics.  PICC PLACEMENT WITH ULTRASOUND AND FLUOROSCOPY  Technique: After written informed consent was obtained, patient was placed in the supine position on angiographic table. Patency of the left brachial vein was confirmed with ultrasound with image documentation. An appropriate skin site was determined. Skin site was marked. Region was prepped using maximum barrier technique including cap and mask, sterile gown, sterile gloves, large sterile sheet, and Chlorhexidine   as cutaneous antisepsis.  The region was infiltrated locally with 1% lidocaine.   Under real-time ultrasound guidance, the left brachial vein was accessed with a 21 gauge micropuncture needle; the needle tip within the vein was confirmed with ultrasound image documentation.   Needle exchanged over a 018 guidewire for a peel-away sheath, through which a 5-French dual- lumen power injectable PICC  was advanced, positioned with its tip near the cavoatrial junction.  Spot chest radiograph confirms appropriate catheter position.  Catheter was flushed per protocol and secured externally with 0-Prolene sutures.  The patient tolerated procedure well, with no immediate complication.  Fluoroscopy time: Six seconds  IMPRESSION: Technically successful five [place] dual lumen power injectable PICC placement   Original Report Authenticated By: D. Andria Rhein, MD   Ir US Guide Vasc Access Left  05/24/2013   *RADIOLOGY REPORT*  Clinical data:  Osteomyelitis, needs access for antibiotics.  PICC PLACEMENT WITH ULTRASOUND AND FLUOROSCOPY  Technique: After written informed consent was obtained, patient was placed in the supine position on angiographic table. Patency of the left brachial vein was confirmed with ultrasound with image documentation. An appropriate skin site was determined. Skin site was marked. Region was prepped using maximum barrier technique including cap and mask, sterile gown, sterile gloves, large sterile sheet, and Chlorhexidine   as cutaneous antisepsis.  The region was infiltrated locally with 1% lidocaine.   Under real-time ultrasound guidance, the left brachial vein was accessed with a 21 gauge micropuncture needle; the needle tip within the vein was confirmed with ultrasound image documentation.  Needle exchanged over a 018 guidewire for a peel-away sheath, through which a 5-French dual- lumen power injectable PICC  was advanced, positioned with its tip near the cavoatrial junction.  Spot chest radiograph confirms appropriate catheter position.  Catheter was flushed per protocol and secured externally with 0-Prolene sutures.  The patient tolerated procedure well, with no immediate complication.  Fluoroscopy time: Six seconds  IMPRESSION: Technically successful five [place] dual lumen power injectable PICC placement   Original Report Authenticated By: D. Andria Rhein, MD    Scheduled Meds: . enoxaparin (LOVENOX) injection  40 mg Subcutaneous Q24H  . levofloxacin (LEVAQUIN) IV  750 mg Intravenous Q24H  . naproxen  500 mg Oral BID WC  . nicotine  21 mg Transdermal Daily  . pantoprazole  40 mg Oral Daily  . sodium chloride  10-40 mL Intracatheter Q12H  . vancomycin  1,250 mg Intravenous Q8H   Continuous Infusions: . sodium chloride 20 mL/hr at 05/21/13 1807    Active Problems:   Chronic narcotic dependence   Chronic back pain   Osteomyelitis of thoracic region    Time spent:    Khalil Szczepanik K  [facility] Pager [phone]. If 7PM-7AM, please contact night-coverage at [email or web address], password [facility] 05/25/2013, 12:03 PM  LOS: 11 days

## 2013-05-26 DIAGNOSIS — T6391XA Toxic effect of contact with unspecified venomous animal, accidental (unintentional), initial encounter: Secondary | ICD-10-CM

## 2013-05-26 NOTE — Progress Notes (Signed)
[facility] PROGRESS NOTE  Carl Valencia WUJ:[id number] DOB: 09-05-1981 DOA: 05/14/2013 PCP: No PCP Per Patient  Assessment/Plan: 1-Osteomyelitis T7/T8 (thoracic region)  -back pain continue to be an issue, but overall control with current medications  -per ID rec's will continue vanc and levaquin; might require IV antibiotics for 4 weeks, given recurrence of infection  -per patient request and in favor of medication compliance will transfer to [facility] hospital; patient received all his care there and has received in recent past tx for vertebral osteomyelitis as well  -case was discussed with Dr. Jonn Shingles who accepted patient in transfer  -Still awaiting bed availability at [facility]  -patient with intermittent episodes of fever; work up including CXR, blood cx and UA negative.  -Afebrile over 24hrs  2-Snake bite: to L arm - resolved 3-Chronic back pain: will continue pain medications.  -Neurosurgery evaluated on patient and he do not require intervention at this point.  -continue IV antibiotics  -PICC line has been place to make easier administration of medications and blood work.  4-Tobacco abuse: will continue nicotine patch. Counseling provided  5-RUE IV line   Code Status: Full Family Communication: Pt in room (indicate person spoken with, relationship, and if by phone, the number) Disposition Plan: Pending   Consultants:  Neurosurgery  Procedures:  PICC on 05/23/13  Antibiotics: Vanc 05/14/13>>>  Levaquin 05/14/13>>>  HPI/Subjective: No complaints.   Objective: Filed Vitals:   05/24/13 1433 05/24/13 2244 05/25/13 2100 05/26/13 0549  BP: 132/55 138/67 174/69 160/67  Pulse: 77 58 56 59  Temp: 98.6 F (37 C) 98.5 F (36.9 C) 99.4 F (37.4 C) 98.9 F (37.2 C)  TempSrc:      Resp: 18 18 18 16   Height:      Weight:      SpO2: 100% 100% 99% 99%    Intake/Output Summary (Last 24 hours) at 05/26/13 0826 Last data filed at 05/25/13 2230  Gross per 24 hour  Intake       0 ml  Output    275 ml  Net   -275 ml   Filed Weights   05/14/13 1617  Weight: 74 kg (163 lb 2.3 oz)    Exam:   General:  Awake in nad  Cardiovascular: regular, s1, s2  Respiratory: normal resp effort, no wheezing  Abdomen: soft, nondistended  Musculoskeletal: perfused, no clubbing   Data Reviewed: Basic Metabolic Panel:  Recent Labs Lab 05/20/13 0405 05/21/13 0440 05/24/13 0505  NA 136  --  139  K 4.1  --  3.9  CL 100  --  103  CO2 26  --  26  GLUCOSE 102*  --  96  BUN 10  --  11  CREATININE 0.77 0.82 0.73  CALCIUM 9.5  --  9.1   Liver Function Tests: No results found for this basename: AST, ALT, ALKPHOS, BILITOT, PROT, ALBUMIN,  in the last 168 hours No results found for this basename: LIPASE, AMYLASE,  in the last 168 hours No results found for this basename: AMMONIA,  in the last 168 hours CBC:  Recent Labs Lab 05/20/13 0405 05/24/13 0505  WBC 7.4 6.8  HGB 12.0* 11.3*  HCT 33.8* 32.6*  MCV 83.3 81.7  PLT 198 209   Cardiac Enzymes: No results found for this basename: CKTOTAL, CKMB, CKMBINDEX, TROPONINI,  in the last 168 hours BNP (last 3 results) No results found for this basename: PROBNP,  in the last 8760 hours CBG: No results found for this basename: GLUCAP,  in the last 168 hours  Recent Results (from the past 240 hour(s))  CLOSTRIDIUM DIFFICILE BY PCR     Status: None   Collection Time    05/16/13  2:22 PM      Result Value Range Status   C difficile by pcr NEGATIVE  NEGATIVE Final  CULTURE, BLOOD (ROUTINE X 2)     Status: None   Collection Time    05/18/13 10:15 PM      Result Value Range Status   Specimen Description BLOOD RIGHT HAND   Final   Special Requests BOTTLES DRAWN AEROBIC ONLY 5CC   Final   Culture  Setup Time 05/19/2013 05:13   Final   Culture NO GROWTH 5 DAYS   Final   Report Status 05/25/2013 FINAL   Final  CULTURE, BLOOD (ROUTINE X 2)     Status: None   Collection Time    05/18/13 10:25 PM      Result Value Range  Status   Specimen Description BLOOD LEFT HAND   Final   Special Requests BOTTLES DRAWN AEROBIC ONLY 5CC   Final   Culture  Setup Time 05/19/2013 05:12   Final   Culture NO GROWTH 5 DAYS   Final   Report Status 05/25/2013 FINAL   Final     Studies: No results found.  Scheduled Meds: . enoxaparin (LOVENOX) injection  40 mg Subcutaneous Q24H  . levofloxacin (LEVAQUIN) IV  750 mg Intravenous Q24H  . naproxen  500 mg Oral BID WC  . nicotine  21 mg Transdermal Daily  . pantoprazole  40 mg Oral Daily  . sodium chloride  10-40 mL Intracatheter Q12H  . vancomycin  1,250 mg Intravenous Q8H   Continuous Infusions: . sodium chloride 20 mL/hr at 05/21/13 1807    Active Problems:   Chronic narcotic dependence   Chronic back pain   Osteomyelitis of thoracic region    Time spent:    [name] K  [facility] Pager [phone]. If 7PM-7AM, please contact night-coverage at [email or web address], password [facility] 05/26/2013, 8:26 AM  LOS: 12 days

## 2013-05-27 LAB — TROPONIN I: Troponin I: <0.3 ng/mL (ref <0.30)

## 2013-05-27 MED ORDER — ALTEPLASE 2 MG IJ SOLR
2.0000 mg | Freq: Once | INTRAMUSCULAR | Status: AC
  Administered 2013-05-27: 2 mg
  Filled 2013-05-27: qty 2

## 2013-05-27 NOTE — Progress Notes (Signed)
[facility] progress note. Chief complaint." Chest fluttering". This 32 year old male in hospital with osteomyelitis of the T7-8 region. He has no history of coronary artery disease though he does have a strong familial history of maternal and paternal. Patient states he awoke with a sensation of chest fluttering and lightheadedness. Nursing responded and obtained a 12-lead EKG which showed some ST depression in V4 through 6 as well as leads 1 and 2. Some ST depression in AVR. Unfortunately there are no prior EKGs for comparison. The patient denies any actual chest pain. He denies associated nausea, radiation, or diaphoresis. I came to the bedside to see the patient and he indicates this is first time he has ever experienced an episode like this. He says the sensation quickly resolved after 5 or 10 minutes and he has felt normal since then. We contacted central monitoring and they report no arrhythmias. Vital signs. Temperature 90.8, pulse 70, respiration 20, blood pressure 150/72. O2 sats 100%. General appearance. Well-developed middle-aged male who is alert, cooperative, and in no distress. Cardiac. Rate and rhythm primarily regular with occasional irregular beats. Lungs. Breath sounds clear and equal. Abdomen. Soft with positive bowel sounds. No pain. Impression/plan. Problem #1 possible arrhythmia. Current EKG does indicate normal sinus rhythm. No abnormalities documented by central monitoring. It is concerning that the patient reports lightheadedness during the event. Will proceed with a troponin now and then every 6 hours for a total of 3 sets. We'll repeat a 12-lead EKG later this a.m. to monitor for any changes. Nursing will inform me of any recurrences.

## 2013-05-27 NOTE — Progress Notes (Signed)
Patient woke in middle of night complaining of feeling of an all of a sudden lightheadedness and feeling of a flutter in his chest.  No complaints of radiating pain, no nausea or vomiting.  Vital signs are stable.  EKG completed and called to Lenny Pastel NP on call for [facility]. EKG showed normal sinus rhythm, with some possible ST depression. Orders for a follow up EKG later in day and a set of troponins.  Patient placed on telemetry.  Patient experiencing bradycardia on telemetry monitor.  Follow up with Lenny Pastel NP for [facility].  Order for EKG scheduled for later in the day to be performed at 0530.  Followup EKG showed sinus bradycardia with early repolarization. First troponin value within normal limits.  No signs or symptoms of distress, patient resting in bed.  Lenny Pastel NP aware.  Will follow up with attending in the am.  No new orders at this time.

## 2013-05-27 NOTE — Progress Notes (Signed)
ANTIBIOTIC CONSULT NOTE - Follow Up  Pharmacy Consult for Vancomycin Indication: vertebral osteomyelitis  Allergies  Allergen Reactions  . Darvocet (Propoxyphene-Acetaminophen) Anaphylaxis  . Penicillins Anaphylaxis and Hives  . Sulfa Antibiotics Anaphylaxis  . Toradol (Ketorolac Tromethamine) Anaphylaxis  . Tylenol (Acetaminophen) Other (See Comments)    Patient has sever liver damage, will and should not take tylenol    Patient Measurements: Wt 74 kg  Vital Signs: Temp: 98.6 F (37 C) (06/14 0500) Temp src: Oral (06/14 0403) BP: 139/75 mmHg (06/14 0500) Pulse Rate: 46 (06/14 0500) Intake/Output from previous day:   Intake/Output from this shift:    Labs: No results found for this basename: WBC, HGB, PLT, LABCREA, CREATININE,  in the last 72 hours Estimated Creatinine Clearance: 132.2 ml/min (by C-G formula based on Cr of 0.73). No results found for this basename: VANCOTROUGH, VANCOPEAK, VANCORANDOM, GENTTROUGH, GENTPEAK, GENTRANDOM, TOBRATROUGH, TOBRAPEAK, TOBRARND, AMIKACINPEAK, AMIKACINTROU, AMIKACIN,  in the last 72 hours   Microbiology: Recent Results (from the past 720 hour(s))  URINE CULTURE     Status: None   Collection Time    05/10/13 11:00 AM      Result Value Range Status   Specimen Description URINE, RANDOM   Final   Special Requests NONE   Final   Culture  Setup Time 05/10/2013 12:32   Final   Colony Count NO GROWTH   Final   Culture NO GROWTH   Final   Report Status 05/11/2013 FINAL   Final  CLOSTRIDIUM DIFFICILE BY PCR     Status: None   Collection Time    05/16/13  2:22 PM      Result Value Range Status   C difficile by pcr NEGATIVE  NEGATIVE Final  CULTURE, BLOOD (ROUTINE X 2)     Status: None   Collection Time    05/18/13 10:15 PM      Result Value Range Status   Specimen Description BLOOD RIGHT HAND   Final   Special Requests BOTTLES DRAWN AEROBIC ONLY 5CC   Final   Culture  Setup Time 05/19/2013 05:13   Final   Culture NO GROWTH 5 DAYS    Final   Report Status 05/25/2013 FINAL   Final  CULTURE, BLOOD (ROUTINE X 2)     Status: None   Collection Time    05/18/13 10:25 PM      Result Value Range Status   Specimen Description BLOOD LEFT HAND   Final   Special Requests BOTTLES DRAWN AEROBIC ONLY 5CC   Final   Culture  Setup Time 05/19/2013 05:12   Final   Culture NO GROWTH 5 DAYS   Final   Report Status 05/25/2013 FINAL   Final    Medical History: Past Medical History  Diagnosis Date  . Medical history non-contributory  04/2013    snake bite  . Medical history non-contributory     Assessment: 32 y/o male patient on day 14 vancomycin for vertebral osteomyelitis. Plan is for 4 weeks of vancomycin and Levaquin. Vancomycin trough was 17.9 mcg/ml on 6/11  at goal of 15-20 mcg/ml. Renal function is stable. Will need to watch vancomycin closely on q8h dosing which may require more frequent vancomycin troughs. Patient will transfer to [facility] once bed available.  Antibiotics: Vanc 6/1 >> LVQ 6/1 >> 6/2, resumed 6/5 >>  6/4 VT = 14.6 mcg/mL 6/8 VT = 12.8 mcg/mL 6/11 VT = 17.9  Cultures: 6/5 blood cx - NG  Goal of Therapy:  Vancomycin trough level 15-20  mcg/ml  Plan:  -Continue vancomycin 1250 mg IV q8h -F/u renal function and clinical course. -Recheck vancomycin trough tomorrow  Sheppard Coil PharmD., BCPS Clinical Pharmacist Pager [phone] 05/27/2013 12:03 PM

## 2013-05-27 NOTE — Progress Notes (Signed)
[facility] PROGRESS NOTE  Carl Valencia AOZ:[id number] DOB: [birth year] DOA: 05/14/2013 PCP: No PCP Per Patient  Assessment/Plan: 1-Osteomyelitis T7/T8 (thoracic region)  -back pain continue to be an issue, but overall control with current medications  -per ID rec's will continue vanc and levaquin; might require IV antibiotics for 4 weeks, given recurrence of infection  -per patient request and in favor of medication compliance will transfer to [facility] hospital; patient received all his care there and has received in recent past tx for vertebral osteomyelitis as well  -case was discussed with Dr. Jonn Shingles who accepted patient in transfer  -Still awaiting bed availability at [facility]   2-Snake bite: to L arm - resolved  3-Chronic back pain: will continue pain medications.  -Neurosurgery evaluated on patient and he do not require intervention at this point.  -continue IV antibiotics  -PICC line has been place to make easier administration of medications and blood work.  4-Tobacco abuse: will continue nicotine patch. Counseling provided  5-RUE IV line  6. Palpitations: - so far w/u unremarkable. Night float note reviewed. NSR on latest EKG. Follow for now  Code Status: Full Family Communication: Pt in room (indicate person spoken with, relationship, and if by phone, the number) Disposition Plan: Pending transfer to [facility]  Consultants:  Neurosurgery  Procedures:  PICC placement on 05/23/13  Antibiotics: Vanc 05/14/13>>>  Levaquin 05/14/13>>>  HPI/Subjective: See night float note. Pt reports palpitations overnight with neg workup thus far. Presently without complaints.  Objective: Filed Vitals:   05/26/13 0549 05/27/13 0217 05/27/13 0403 05/27/13 0500  BP: 160/67 150/72 152/60 139/75  Pulse: 59 70 62 46  Temp: 98.9 F (37.2 C) 98.8 F (37.1 C) 99.3 F (37.4 C) 98.6 F (37 C)  TempSrc:  Oral Oral   Resp: 16 20 20 20   Height:      Weight:      SpO2: 99% 100% 100% 99%   No intake  or output data in the 24 hours ending 05/27/13 1053 Filed Weights   05/14/13 1617  Weight: 74 kg (163 lb 2.3 oz)    Exam:   General:  Awake, in nad  Cardiovascular: regular, s1, s2  Respiratory: normal resp effort, no wheezing  Abdomen: soft, nondistended  Musculoskeletal: perfused,no clubbing   Data Reviewed: Basic Metabolic Panel:  Recent Labs Lab 05/21/13 0440 05/24/13 0505  NA  --  139  K  --  3.9  CL  --  103  CO2  --  26  GLUCOSE  --  96  BUN  --  11  CREATININE 0.82 0.73  CALCIUM  --  9.1   Liver Function Tests: No results found for this basename: AST, ALT, ALKPHOS, BILITOT, PROT, ALBUMIN,  in the last 168 hours No results found for this basename: LIPASE, AMYLASE,  in the last 168 hours No results found for this basename: AMMONIA,  in the last 168 hours CBC:  Recent Labs Lab 05/24/13 0505  WBC 6.8  HGB 11.3*  HCT 32.6*  MCV 81.7  PLT 209   Cardiac Enzymes:  Recent Labs Lab 05/27/13 0415 05/27/13 0856  TROPONINI <0.30 <0.30   BNP (last 3 results) No results found for this basename: PROBNP,  in the last 8760 hours CBG: No results found for this basename: GLUCAP,  in the last 168 hours  Recent Results (from the past 240 hour(s))  CULTURE, BLOOD (ROUTINE X 2)     Status: None   Collection Time    05/18/13 10:15 PM  Result Value Range Status   Specimen Description BLOOD RIGHT HAND   Final   Special Requests BOTTLES DRAWN AEROBIC ONLY 5CC   Final   Culture  Setup Time 05/19/2013 05:13   Final   Culture NO GROWTH 5 DAYS   Final   Report Status 05/25/2013 FINAL   Final  CULTURE, BLOOD (ROUTINE X 2)     Status: None   Collection Time    05/18/13 10:25 PM      Result Value Range Status   Specimen Description BLOOD LEFT HAND   Final   Special Requests BOTTLES DRAWN AEROBIC ONLY 5CC   Final   Culture  Setup Time 05/19/2013 05:12   Final   Culture NO GROWTH 5 DAYS   Final   Report Status 05/25/2013 FINAL   Final     Studies: No  results found.  Scheduled Meds: . enoxaparin (LOVENOX) injection  40 mg Subcutaneous Q24H  . levofloxacin (LEVAQUIN) IV  750 mg Intravenous Q24H  . naproxen  500 mg Oral BID WC  . nicotine  21 mg Transdermal Daily  . pantoprazole  40 mg Oral Daily  . sodium chloride  10-40 mL Intracatheter Q12H  . vancomycin  1,250 mg Intravenous Q8H   Continuous Infusions: . sodium chloride 20 mL/hr at 05/26/13 1907    Active Problems:   Chronic narcotic dependence   Chronic back pain   Osteomyelitis of thoracic region    Time spent:    Aleiya Rye K  [facility] Pager [phone]. If 7PM-7AM, please contact night-coverage at [email or web address], password [facility] 05/27/2013, 10:53 AM  LOS: 13 days

## 2013-05-28 MED ORDER — HEPARIN SOD (PORK) LOCK FLUSH 100 UNIT/ML IV SOLN: 250.0000 [IU] | INTRAVENOUS | Status: DC | PRN

## 2013-05-28 NOTE — Progress Notes (Signed)
[facility] has bed available for this pt room 3213,report called to Marigene Ehlers [phone],UJWJ line to be capped [facility] protocol that picc must be xrayed and placement verified before access can be done there at [facility].VSS, alert oreinted x 4 callahan pa notified that pt to be transferred tonight. Linward Headland D

## 2013-05-28 NOTE — Progress Notes (Signed)
Pt ready for transfer vss,prn for pain given ,[employer] to transfer via ambulance.copy of physicians certificate of transfer sent with pt. All pt info in Epic. Linward Headland D

## 2013-07-11 DIAGNOSIS — G8929 Other chronic pain: Secondary | ICD-10-CM | POA: Insufficient documentation

## 2013-07-11 DIAGNOSIS — Z8739 Personal history of other diseases of the musculoskeletal system and connective tissue: Secondary | ICD-10-CM | POA: Insufficient documentation

## 2014-01-03 DIAGNOSIS — R109 Unspecified abdominal pain: Secondary | ICD-10-CM | POA: Insufficient documentation

## 2014-12-20 DIAGNOSIS — M654 Radial styloid tenosynovitis [de Quervain]: Secondary | ICD-10-CM | POA: Insufficient documentation

## 2015-04-16 ENCOUNTER — Emergency Department (HOSPITAL_BASED_OUTPATIENT_CLINIC_OR_DEPARTMENT_OTHER)

## 2015-04-16 ENCOUNTER — Encounter (HOSPITAL_BASED_OUTPATIENT_CLINIC_OR_DEPARTMENT_OTHER): Payer: Self-pay | Admitting: *Deleted

## 2015-04-16 ENCOUNTER — Emergency Department (HOSPITAL_BASED_OUTPATIENT_CLINIC_OR_DEPARTMENT_OTHER): Payer: Self-pay

## 2015-04-16 ENCOUNTER — Emergency Department (HOSPITAL_BASED_OUTPATIENT_CLINIC_OR_DEPARTMENT_OTHER)
Admission: EM | Admit: 2015-04-16 | Discharge: 2015-04-16 | Disposition: A | Discharge status: Home / Self Care | Payer: Self-pay | Attending: Emergency Medicine | Admitting: Emergency Medicine

## 2015-04-16 DIAGNOSIS — Z87828 Personal history of other (healed) physical injury and trauma: Secondary | ICD-10-CM | POA: Insufficient documentation

## 2015-04-16 DIAGNOSIS — Z79899 Other long term (current) drug therapy: Secondary | ICD-10-CM | POA: Insufficient documentation

## 2015-04-16 DIAGNOSIS — Z880 Allergy status to penicillin: Secondary | ICD-10-CM | POA: Insufficient documentation

## 2015-04-16 DIAGNOSIS — R1909 Other intra-abdominal and pelvic swelling, mass and lump: Secondary | ICD-10-CM

## 2015-04-16 DIAGNOSIS — Z792 Long term (current) use of antibiotics: Secondary | ICD-10-CM | POA: Insufficient documentation

## 2015-04-16 DIAGNOSIS — N5082 Scrotal pain: Secondary | ICD-10-CM

## 2015-04-16 DIAGNOSIS — N508 Other specified disorders of male genital organs: Secondary | ICD-10-CM | POA: Insufficient documentation

## 2015-04-16 DIAGNOSIS — Z8739 Personal history of other diseases of the musculoskeletal system and connective tissue: Secondary | ICD-10-CM | POA: Insufficient documentation

## 2015-04-16 DIAGNOSIS — Z720 Tobacco use: Secondary | ICD-10-CM | POA: Insufficient documentation

## 2015-04-16 HISTORY — DX: Toxic effect of unspecified snake venom, accidental (unintentional), initial encounter: T63.001A

## 2015-04-16 HISTORY — DX: Osteomyelitis, unspecified: M86.9

## 2015-04-16 LAB — URINE MICROSCOPIC-ADD ON

## 2015-04-16 LAB — CBC WITH DIFFERENTIAL/PLATELET
Basophils Absolute: 0 10*3/uL (ref 0.0–0.1)
Basophils Relative: 0 % (ref 0–1)
EOS ABS: 0.1 10*3/uL (ref 0.0–0.7)
EOS PCT: 2 % (ref 0–5)
HEMATOCRIT: 43.9 % (ref 39.0–52.0)
HEMOGLOBIN: 15.4 g/dL (ref 13.0–17.0)
LYMPHS ABS: 2.3 10*3/uL (ref 0.7–4.0)
Lymphocytes Relative: 33 % (ref 12–46)
MCH: 31.8 pg (ref 26.0–34.0)
MCHC: 35.1 g/dL (ref 30.0–36.0)
MCV: 90.7 fL (ref 78.0–100.0)
MONO ABS: 0.8 10*3/uL (ref 0.1–1.0)
Monocytes Relative: 11 % (ref 3–12)
Neutro Abs: 3.9 10*3/uL (ref 1.7–7.7)
Neutrophils Relative %: 54 % (ref 43–77)
PLATELETS: 185 10*3/uL (ref 150–400)
RBC: 4.84 MIL/uL (ref 4.22–5.81)
RDW: 12.3 % (ref 11.5–15.5)
WBC: 7.1 10*3/uL (ref 4.0–10.5)

## 2015-04-16 LAB — URINALYSIS, ROUTINE W REFLEX MICROSCOPIC
Bilirubin Urine: NEGATIVE
GLUCOSE, UA: NEGATIVE mg/dL
HGB URINE DIPSTICK: NEGATIVE
Ketones, ur: NEGATIVE mg/dL
LEUKOCYTES UA: NEGATIVE
NITRITE: NEGATIVE
PROTEIN: 30 mg/dL — AB
SPECIFIC GRAVITY, URINE: 1.026 (ref 1.005–1.030)
UROBILINOGEN UA: 1 mg/dL (ref 0.0–1.0)
pH: 8.5 — ABNORMAL HIGH (ref 5.0–8.0)

## 2015-04-16 MED ORDER — ONDANSETRON HCL 4 MG/2ML IJ SOLN
4.0000 mg | Freq: Once | INTRAMUSCULAR | Status: AC
  Administered 2015-04-16: 4 mg via INTRAVENOUS
  Filled 2015-04-16: qty 2

## 2015-04-16 MED ORDER — SODIUM CHLORIDE 0.9 % IV BOLUS (SEPSIS)
1000.0000 mL | Freq: Once | INTRAVENOUS | Status: AC
  Administered 2015-04-16: 1000 mL via INTRAVENOUS

## 2015-04-16 MED ORDER — HYDROMORPHONE HCL 1 MG/ML IJ SOLN
1.0000 mg | Freq: Once | INTRAMUSCULAR | Status: AC
  Administered 2015-04-16: 1 mg via INTRAMUSCULAR
  Filled 2015-04-16: qty 1

## 2015-04-16 MED ORDER — HYDROMORPHONE HCL 1 MG/ML IJ SOLN
1.0000 mg | Freq: Once | INTRAMUSCULAR | Status: AC
  Administered 2015-04-16: 1 mg via INTRAVENOUS
  Filled 2015-04-16: qty 1

## 2015-04-16 MED ORDER — OXYCODONE HCL 5 MG PO TABS: 5.0000 mg | ORAL_TABLET | ORAL | Status: DC | PRN

## 2015-04-16 MED ORDER — DOXYCYCLINE HYCLATE 100 MG PO CAPS: 100.0000 mg | ORAL_CAPSULE | Freq: Two times a day (BID) | ORAL | Status: DC

## 2015-04-16 MED ORDER — AZITHROMYCIN 250 MG PO TABS
2000.0000 mg | ORAL_TABLET | Freq: Once | ORAL | Status: AC
  Administered 2015-04-16: 2000 mg via ORAL
  Filled 2015-04-16: qty 8

## 2015-04-16 MED ORDER — IOHEXOL 300 MG/ML  SOLN
100.0000 mL | Freq: Once | INTRAMUSCULAR | Status: AC | PRN
  Administered 2015-04-16: 100 mL via INTRAVENOUS

## 2015-04-16 NOTE — ED Provider Notes (Signed)
CSN: [id number]     Arrival date & time 04/16/15  4696 History   First MD Initiated Contact with Patient 04/16/15 1048     Chief Complaint  Patient presents with  . Groin Swelling     (Consider location/radiation/quality/duration/timing/severity/associated sxs/prior Treatment) Patient is a 34 y.o. male presenting with male genitourinary complaint.  Male GU Problem Presenting symptoms: scrotal pain   Context comment:  While walking.   Relieved by:  Nothing Worsened by:  Movement and tactile pressure Associated symptoms: nausea, scrotal swelling and vomiting   Associated symptoms: no abdominal pain, no fever, no hematuria, no penile redness and no penile swelling     Past Medical History  Diagnosis Date  . Medical history non-contributory  04/2013    snake bite  . Medical history non-contributory   . Snake bite poisoning   . Bone infection     spine   Past Surgical History  Procedure Laterality Date  . Exploratory laparotomy     No family history on file. History  Substance Use Topics  . Smoking status: Current Every Day Smoker -- 2.00 packs/day for 17 years    Types: Cigarettes  . Smokeless tobacco: Never Used  . Alcohol Use: No    Review of Systems  Constitutional: Negative for fever.  Gastrointestinal: Positive for nausea and vomiting. Negative for abdominal pain.  Genitourinary: Positive for scrotal swelling. Negative for hematuria and penile swelling.  All other systems reviewed and are negative.     Allergies  Darvocet; Penicillins; Sulfa antibiotics; Toradol; and Tylenol  Home Medications   Prior to Admission medications   Medication Sig Start Date End Date Taking? Authorizing Provider  enoxaparin (LOVENOX) 40 MG/0.4ML injection Inject 0.4 mLs (40 mg total) into the skin daily. 05/17/13   Vassie Loll, MD  levofloxacin (LEVAQUIN) 750 MG/150ML SOLN Inject 150 mLs (750 mg total) into the vein daily. 05/17/13   Vassie Loll, MD  nicotine (NICODERM CQ - DOSED  IN MG/24 HOURS) 21 mg/24hr patch Place 1 patch onto the skin daily. 05/08/13   Leroy Sea, MD  ondansetron (ZOFRAN-ODT) 4 MG disintegrating tablet Take 1 tablet (4 mg total) by mouth once. 05/09/13   Toney Sang, MD  oxyCODONE (ROXICODONE) 15 MG immediate release tablet Take 1 tablet (15 mg total) by mouth every 4 (four) hours as needed. 05/08/13   Leroy Sea, MD  pantoprazole (PROTONIX) 40 MG tablet Take 1 tablet (40 mg total) by mouth daily. 05/12/13   Kela Millin, MD  polyethylene glycol (MIRALAX / GLYCOLAX) packet Take 17 g by mouth daily as needed (constipation). 05/08/13   Leroy Sea, MD  sodium chloride 0.9 % infusion Inject 20 mLs into the vein continuous. 05/17/13   Vassie Loll, MD  vancomycin (VANCOCIN) 1 GM/200ML SOLN Inject 200 mLs (1,000 mg total) into the vein every 8 (eight) hours. 05/17/13   Vassie Loll, MD   BP 147/105 mmHg  Temp(Src) 97.8 F (36.6 C) (Oral)  Resp 22  SpO2 99% Physical Exam  Constitutional: He is oriented to person, place, and time. He appears well-developed and well-nourished. No distress.  HENT:  Head: Normocephalic and atraumatic.  Eyes: Conjunctivae are normal. No scleral icterus.  Neck: Neck supple.  Cardiovascular: Normal rate and intact distal pulses.   Pulmonary/Chest: Effort normal. No stridor. No respiratory distress.  Abdominal: Soft. Normal appearance. He exhibits no distension. There is no tenderness. Hernia confirmed negative in the right inguinal area and confirmed negative in the left inguinal area.  Genitourinary: Penis normal. Right testis shows swelling and tenderness. Right testis shows no mass. Left testis shows swelling and tenderness. Left testis shows no mass. Uncircumcised. No penile erythema. No discharge found.  Neurological: He is alert and oriented to person, place, and time.  Skin: Skin is warm and dry. No rash noted.  Psychiatric: He has a normal mood and affect. His behavior is normal.  Nursing note and  vitals reviewed.   ED Course  Procedures (including critical care time) Labs Review Labs Reviewed  URINALYSIS, ROUTINE W REFLEX MICROSCOPIC - Abnormal; Notable for the following:    APPearance CLOUDY (*)    pH 8.5 (*)    Protein, ur 30 (*)    All other components within normal limits  URINE MICROSCOPIC-ADD ON - Abnormal; Notable for the following:    Bacteria, UA MANY (*)    All other components within normal limits  CBC WITH DIFFERENTIAL/PLATELET  GC/CHLAMYDIA PROBE AMP ([place])    Imaging Review [place] Scrotum  04/16/2015   CLINICAL DATA:  Bilateral testicular pain.  EXAM: ULTRASOUND OF SCROTUM  TECHNIQUE: Complete ultrasound examination of the testicles, epididymis, and other scrotal structures was performed.  COMPARISON:  CT abdomen pelvis 05/08/2013  FINDINGS: Right testicle  Measurements: 4.4 x 2.3 x 2.6 cm. No mass or microlithiasis visualized.  Left testicle  Measurements: 4.4 x 2.1 x 2.5 cm. No mass or microlithiasis visualized.  Right epididymis:  Normal in size and appearance.  Left epididymis: Normal in size. Multiple small epididymal head cysts versus spermatoceles.  Hydrocele:  None visualized.  Varicocele:  None visualized.  There is marked thickening of the scrotum. Along the inferior aspect of the scrotum there is suggestion of possible small fluid collection (1 cm). Additionally there is dirty shadowing (image 57) which may represent a small amount of gas.  IMPRESSION: Thickening of the scrotal wall which may represent cellulitis. Additionally there is suggestion of a 10 mm hypoechoic collection with shadowing, raising possibility of gas within the soft tissues. Given the overall appearance and multiple findings, recommend correlation with pelvic CT to exclude the possibility of more extensive gas within the scrotal soft tissues and exclude the possibility of Fournier's gangrene.  Normal sonographic appearance of the testicles and epididymis bilaterally.  These results were  called by telephone at the time of interpretation on 04/16/2015 at 12:12 pm to Dr. Blake DivineJOHN Nikolay Demetriou , who verbally acknowledged these results.   Electronically Signed   By: Annia Beltrew  Davis M.D.   On: 04/16/2015 12:14   Ct Abdomen Pelvis W Contrast  04/16/2015   CLINICAL DATA:  Groin and testicle pain with swelling and redness for 2 days.  EXAM: CT ABDOMEN AND PELVIS WITH CONTRAST  TECHNIQUE: Multidetector CT imaging of the abdomen and pelvis was performed using the standard protocol following bolus administration of intravenous contrast.  CONTRAST:  100mL OMNIPAQUE IOHEXOL 300 MG/ML  SOLN  COMPARISON:  Scrotal sonography earlier today.  FINDINGS: BODY WALL: Unremarkable.  LOWER CHEST: Unremarkable.  ABDOMEN/PELVIS:  Liver: No focal abnormality.  Biliary: No evidence of biliary obstruction or stone.  Pancreas: Unremarkable.  Spleen: Unremarkable.  Adrenals: Unremarkable.  Kidneys and ureters: No hydronephrosis or stone.  Bladder: Unremarkable.  Reproductive: Thickening of the scrotal wall consistent with cellulitis. Some associated scrotal fluid. No evidence for gas-forming organism. Specifically no evidence for gas gangrene. No regional adenopathy.  Bowel: No obstruction. Normal appendix.  Retroperitoneum: No mass or adenopathy.  Peritoneum: No free fluid or gas.  Vascular: No acute abnormality.  OSSEOUS: No acute abnormalities.  IMPRESSION: Scrotal wall thickening and fluid consistent with cellulitis. No evidence for gas in the scrotum or perineum. Otherwise unremarkable abdomen CT.   Electronically Signed   By: Davonna Belling M.D.   On: 04/16/2015 13:53   [place] Art/ven Flow Abd Pelv Doppler  04/16/2015   CLINICAL DATA:  Bilateral testicular pain.  EXAM: ULTRASOUND OF SCROTUM  TECHNIQUE: Complete ultrasound examination of the testicles, epididymis, and other scrotal structures was performed.  COMPARISON:  CT abdomen pelvis 05/08/2013  FINDINGS: Right testicle  Measurements: 4.4 x 2.3 x 2.6 cm. No mass or microlithiasis  visualized.  Left testicle  Measurements: 4.4 x 2.1 x 2.5 cm. No mass or microlithiasis visualized.  Right epididymis:  Normal in size and appearance.  Left epididymis: Normal in size. Multiple small epididymal head cysts versus spermatoceles.  Hydrocele:  None visualized.  Varicocele:  None visualized.  There is marked thickening of the scrotum. Along the inferior aspect of the scrotum there is suggestion of possible small fluid collection (1 cm). Additionally there is dirty shadowing (image 57) which may represent a small amount of gas.  IMPRESSION: Thickening of the scrotal wall which may represent cellulitis. Additionally there is suggestion of a 10 mm hypoechoic collection with shadowing, raising possibility of gas within the soft tissues. Given the overall appearance and multiple findings, recommend correlation with pelvic CT to exclude the possibility of more extensive gas within the scrotal soft tissues and exclude the possibility of Fournier's gangrene.  Normal sonographic appearance of the testicles and epididymis bilaterally.  These results were called by telephone at the time of interpretation on 04/16/2015 at 12:12 pm to Dr. Blake Divine , who verbally acknowledged these results.   Electronically Signed   By: Annia Belt M.D.   On: 04/16/2015 12:14  All radiology studies independently viewed by me.      EKG Interpretation None      MDM   Final diagnoses:  Scrotal pain    34 yo male with scrotal pain.  He describes fairly sudden onset.  [place] negative for torsion.  His exam is most consistent with orchitis on the right, but his imaging suggestive of uncomplicated scrotal cellulitis.  As such, will treat with azithromycin (2g) due to PCN allergy and Doxy to cover both orchitis and cellulitis.  Due to degree of pain, advised to return tomorrow for recheck or see PCP.      Blake Divine, MD 04/16/15 [phone]

## 2015-04-16 NOTE — Discharge Instructions (Signed)
Scrotal Swelling Scrotal swelling may occur on one or both sides of the scrotum. Pain may also occur with swelling. Possible causes of scrotal swelling include:   Injury.  Infection.  An ingrown hair or abrasion in the area.  Repeated rubbing from tight-fitting underwear.  Poor hygiene.  A weakened area in the muscles around the groin (hernia). A hernia can allow abdominal contents to push into the scrotum.  Fluid around the testicle (hydrocele).  Enlarged vein around the testicle (varicocele).  Certain medical treatments or existing conditions.  A recent genital surgery or procedure.  The spermatic cord becomes twisted in the scrotum, which cuts off blood supply (testicular torsion).  Testicular cancer. HOME CARE INSTRUCTIONS Once the cause of your scrotal swelling has been determined, you may be asked to monitor your scrotum for any changes. The following actions may help to alleviate any discomfort you are experiencing:  Rest and limit activity until the swelling goes away. Lying down is the preferred position.  Put ice on the scrotum:  Put ice in a plastic bag.  Place a towel between your skin and the bag.  Leave the ice on for 20 minutes, 2-3 times a day for 1-2 days.  Place a rolled towel under the testicles for support.  Wear loose-fitting clothing or an athletic support cup for comfort.  Take all medicines as directed by your health care provider.  Perform a monthly self-exam of the scrotum and penis. Feel for changes. Ask your health care provider how to perform a monthly self-exam if you are unsure. SEEK MEDICAL CARE IF:  You have a sudden (acute) onset of pain that is persistent and not improving.  You notice a heavy feeling or fluid in the scrotum.  You have pain or burning while urinating.  You have blood in the urine or semen.  You feel a lump around the testicle.  You notice that one testicle is larger than the other (slight variation is  normal).  You have a persistent dull ache or pain in the groin or scrotum. SEEK IMMEDIATE MEDICAL CARE IF:  The pain does not go away or becomes severe.  You have a fever or shaking chills.  You have pain or vomiting that cannot be controlled.  You notice significant redness or swelling of one or both sides of the scrotum.  You experience redness spreading upward from your scrotum to your abdomen or downward from your scrotum to your thighs. MAKE SURE YOU:  Understand these instructions.  Will watch your condition.  Will get help right away if you are not doing well or get worse. Document Released: 01/02/2011 Document Revised: 08/02/2013 Document Reviewed: 05/04/2013 Exit[employer] Patient Information 2015 [employer]. This information is not intended to replace advice given to you by your health care provider. Make sure you discuss any questions you have with your health care provider.  

## 2015-04-16 NOTE — ED Notes (Signed)
Patient c/o sudden onset testicle pain 2 hours ago, he states the pain mad him vomit & scrotum enlarged & throbbing. No injury that he is aware of

## 2015-04-17 LAB — GC/CHLAMYDIA PROBE AMP (~~LOC~~) NOT AT ARMC
Chlamydia: NEGATIVE
Neisseria Gonorrhea: NEGATIVE

## 2015-04-18 LAB — I-STAT CHEM 8, ED
BUN: 13 mg/dL (ref 6–20)
CALCIUM ION: 1.21 mmol/L (ref 1.12–1.23)
Chloride: 102 mmol/L (ref 101–111)
Creatinine, Ser: 0.9 mg/dL (ref 0.61–1.24)
GLUCOSE: 90 mg/dL (ref 70–99)
Potassium: 3.4 mmol/L — ABNORMAL LOW (ref 3.5–5.1)
Sodium: 142 mmol/L (ref 135–145)
TCO2: 23 mmol/L (ref 0–100)

## 2015-05-26 DIAGNOSIS — B370 Candidal stomatitis: Secondary | ICD-10-CM | POA: Insufficient documentation

## 2015-05-26 DIAGNOSIS — R509 Fever, unspecified: Secondary | ICD-10-CM | POA: Insufficient documentation

## 2015-05-26 DIAGNOSIS — M25519 Pain in unspecified shoulder: Secondary | ICD-10-CM | POA: Insufficient documentation

## 2015-05-27 DIAGNOSIS — M009 Pyogenic arthritis, unspecified: Secondary | ICD-10-CM | POA: Insufficient documentation

## 2015-05-27 DIAGNOSIS — I809 Phlebitis and thrombophlebitis of unspecified site: Secondary | ICD-10-CM | POA: Insufficient documentation

## 2015-05-27 DIAGNOSIS — L0390 Cellulitis, unspecified: Secondary | ICD-10-CM | POA: Insufficient documentation

## 2015-05-31 DIAGNOSIS — R7881 Bacteremia: Secondary | ICD-10-CM | POA: Insufficient documentation

## 2015-06-22 DIAGNOSIS — I809 Phlebitis and thrombophlebitis of unspecified site: Secondary | ICD-10-CM | POA: Insufficient documentation

## 2015-08-15 ENCOUNTER — Emergency Department (HOSPITAL_COMMUNITY)
Admission: EM | Admit: 2015-08-15 | Discharge: 2015-08-15 | Disposition: A | Discharge status: Home / Self Care | Attending: Emergency Medicine | Admitting: Emergency Medicine

## 2015-08-15 ENCOUNTER — Encounter (HOSPITAL_COMMUNITY): Payer: Self-pay

## 2015-08-15 ENCOUNTER — Emergency Department (HOSPITAL_COMMUNITY)

## 2015-08-15 DIAGNOSIS — Z720 Tobacco use: Secondary | ICD-10-CM | POA: Insufficient documentation

## 2015-08-15 DIAGNOSIS — Z792 Long term (current) use of antibiotics: Secondary | ICD-10-CM | POA: Insufficient documentation

## 2015-08-15 DIAGNOSIS — Y9389 Activity, other specified: Secondary | ICD-10-CM | POA: Insufficient documentation

## 2015-08-15 DIAGNOSIS — S299XXA Unspecified injury of thorax, initial encounter: Secondary | ICD-10-CM | POA: Insufficient documentation

## 2015-08-15 DIAGNOSIS — Y9289 Other specified places as the place of occurrence of the external cause: Secondary | ICD-10-CM | POA: Insufficient documentation

## 2015-08-15 DIAGNOSIS — Z880 Allergy status to penicillin: Secondary | ICD-10-CM | POA: Insufficient documentation

## 2015-08-15 DIAGNOSIS — W228XXA Striking against or struck by other objects, initial encounter: Secondary | ICD-10-CM | POA: Insufficient documentation

## 2015-08-15 DIAGNOSIS — S301XXA Contusion of abdominal wall, initial encounter: Secondary | ICD-10-CM

## 2015-08-15 DIAGNOSIS — S20229A Contusion of unspecified back wall of thorax, initial encounter: Secondary | ICD-10-CM | POA: Insufficient documentation

## 2015-08-15 DIAGNOSIS — Y998 Other external cause status: Secondary | ICD-10-CM | POA: Insufficient documentation

## 2015-08-15 LAB — CBC WITH DIFFERENTIAL/PLATELET
HCT: 40.9 % (ref 39.0–52.0)
HEMOGLOBIN: 13.7 g/dL (ref 13.0–17.0)
MCH: 29.7 pg (ref 26.0–34.0)
MCHC: 33.5 g/dL (ref 30.0–36.0)
MCV: 88.7 fL (ref 78.0–100.0)
Platelets: 215 10*3/uL (ref 150–400)
RBC: 4.61 MIL/uL (ref 4.22–5.81)
RDW: 14.8 % (ref 11.5–15.5)
WBC: 5.9 10*3/uL (ref 4.0–10.5)

## 2015-08-15 LAB — BASIC METABOLIC PANEL
Anion gap: 8 (ref 5–15)
BUN: 12 mg/dL (ref 6–20)
CALCIUM: 9.2 mg/dL (ref 8.9–10.3)
CHLORIDE: 112 mmol/L — AB (ref 101–111)
CO2: 24 mmol/L (ref 22–32)
CREATININE: 0.7 mg/dL (ref 0.61–1.24)
GFR calc Af Amer: >60 mL/min (ref >60)
GFR calc non Af Amer: >60 mL/min (ref >60)
Glucose, Bld: 94 mg/dL (ref 65–99)
Potassium: 4 mmol/L (ref 3.5–5.1)
SODIUM: 144 mmol/L (ref 135–145)

## 2015-08-15 LAB — URINALYSIS, ROUTINE W REFLEX MICROSCOPIC
Bilirubin Urine: NEGATIVE
GLUCOSE, UA: NEGATIVE mg/dL
HGB URINE DIPSTICK: NEGATIVE
KETONES UR: NEGATIVE mg/dL
LEUKOCYTES UA: NEGATIVE
Nitrite: NEGATIVE
PH: 6.5 (ref 5.0–8.0)
Protein, ur: NEGATIVE mg/dL
Specific Gravity, Urine: 1.015 (ref 1.005–1.030)
Urobilinogen, UA: 0.2 mg/dL (ref 0.0–1.0)

## 2015-08-15 MED ORDER — SODIUM CHLORIDE 0.9 % IV SOLN: INTRAVENOUS | Status: DC

## 2015-08-15 MED ORDER — METHOCARBAMOL 500 MG PO TABS: 500.0000 mg | ORAL_TABLET | Freq: Three times a day (TID) | ORAL | Status: DC

## 2015-08-15 MED ORDER — DIAZEPAM 5 MG/ML IJ SOLN
5.0000 mg | Freq: Once | INTRAMUSCULAR | Status: AC
  Administered 2015-08-15: 5 mg via INTRAVENOUS
  Filled 2015-08-15: qty 2

## 2015-08-15 MED ORDER — ONDANSETRON HCL 4 MG/2ML IJ SOLN
4.0000 mg | Freq: Once | INTRAMUSCULAR | Status: AC
  Administered 2015-08-15: 4 mg via INTRAVENOUS
  Filled 2015-08-15: qty 2

## 2015-08-15 MED ORDER — IOHEXOL 300 MG/ML  SOLN
100.0000 mL | Freq: Once | INTRAMUSCULAR | Status: AC | PRN
  Administered 2015-08-15: 100 mL via INTRAVENOUS

## 2015-08-15 MED ORDER — OXYCODONE HCL 5 MG PO TABS: 5.0000 mg | ORAL_TABLET | Freq: Four times a day (QID) | ORAL | Status: DC | PRN

## 2015-08-15 MED ORDER — OXYCODONE HCL 5 MG PO TABS
10.0000 mg | ORAL_TABLET | Freq: Once | ORAL | Status: AC
Start: 2015-08-15 — End: 2015-08-15
  Administered 2015-08-15: 10 mg via ORAL
  Filled 2015-08-15: qty 2

## 2015-08-15 NOTE — ED Provider Notes (Signed)
CSN: [id number]     Arrival date & time 08/15/15  2330 History   First MD Initiated Contact with Patient 08/15/15 1001     Chief Complaint  Patient presents with  . Back Injury     (Consider location/radiation/quality/duration/timing/severity/associated sxs/prior Treatment) HPI Comments: Patient is a 34 year old male who presents to the emergency department with a complaint of left lower flank and back area pain.  The patient states that he was cutting a tree and a thick area. He was trying to cut some of the branches when a large branch kickback and hit him in his left lower flank and back area. The patient states that it knocked the breath out of him, but it did not knock him out. He has not had any vomiting, or noted any blood in urine since the incident. Patient denies being on any anticoagulation medications. He has no history of bleeding disorders. The patient denies any head injury, neck injury, or lower extremity injury.  The history is provided by the patient.    Past Medical History  Diagnosis Date  . Medical history non-contributory  04/2013    snake bite  . Medical history non-contributory   . Snake bite poisoning   . Bone infection     spine   Past Surgical History  Procedure Laterality Date  . Exploratory laparotomy     No family history on file. Social History  Substance Use Topics  . Smoking status: Current Every Day Smoker -- 2.00 packs/day for 17 years    Types: Cigarettes  . Smokeless tobacco: Never Used  . Alcohol Use: No    Review of Systems  Constitutional: Negative for activity change.       All ROS Neg except as noted in HPI  HENT: Negative for nosebleeds.   Eyes: Negative for photophobia and discharge.  Respiratory: Negative for cough, shortness of breath and wheezing.   Cardiovascular: Negative for chest pain and palpitations.  Gastrointestinal: Negative for abdominal pain and blood in stool.  Genitourinary: Negative for dysuria, frequency and  hematuria.  Musculoskeletal: Negative for back pain, arthralgias and neck pain.  Skin: Negative.   Neurological: Negative for dizziness, seizures and speech difficulty.  Psychiatric/Behavioral: Negative for hallucinations and confusion.      Allergies  Darvocet; Penicillins; Sulfa antibiotics; Toradol; Fentanyl; and Tylenol  Home Medications   Prior to Admission medications   Medication Sig Start Date End Date Taking? Authorizing Provider  doxycycline (VIBRAMYCIN) 100 MG capsule Take 1 capsule (100 mg total) by mouth 2 (two) times daily. 04/16/15   Blake Divine, MD  enoxaparin (LOVENOX) 40 MG/0.4ML injection Inject 0.4 mLs (40 mg total) into the skin daily. 05/17/13   Vassie Loll, MD  levofloxacin (LEVAQUIN) 750 MG/150ML SOLN Inject 150 mLs (750 mg total) into the vein daily. 05/17/13   Vassie Loll, MD  nicotine (NICODERM CQ - DOSED IN MG/24 HOURS) 21 mg/24hr patch Place 1 patch onto the skin daily. 05/08/13   Leroy Sea, MD  ondansetron (ZOFRAN-ODT) 4 MG disintegrating tablet Take 1 tablet (4 mg total) by mouth once. 05/09/13   Toney Sang, MD  oxyCODONE (ROXICODONE) 5 MG immediate release tablet Take 1 tablet (5 mg total) by mouth every 4 (four) hours as needed for severe pain. 04/16/15   Blake Divine, MD  pantoprazole (PROTONIX) 40 MG tablet Take 1 tablet (40 mg total) by mouth daily. 05/12/13   Kela Millin, MD  polyethylene glycol (MIRALAX / GLYCOLAX) packet Take 17 g by mouth daily  as needed (constipation). 05/08/13   Leroy Sea, MD  sodium chloride 0.9 % infusion Inject 20 mLs into the vein continuous. 05/17/13   Vassie Loll, MD  vancomycin (VANCOCIN) 1 GM/200ML SOLN Inject 200 mLs (1,000 mg total) into the vein every 8 (eight) hours. 05/17/13   Vassie Loll, MD   BP 131/99 mmHg  Pulse 66  Temp(Src) 97.6 F (36.4 C) (Oral)  Resp 16  Ht  (1.753 m)  Wt 137 lb (62.143 kg)  BMI 20.22 kg/m2  SpO2 100% Physical Exam  Constitutional: He is oriented to person, place,  and time. He appears well-developed and well-nourished.  Non-toxic appearance.  HENT:  Head: Normocephalic.  Right Ear: Tympanic membrane and external ear normal.  Left Ear: Tympanic membrane and external ear normal.  Eyes: EOM and lids are normal. Pupils are equal, round, and reactive to light.  Neck: Normal range of motion. Neck supple. Carotid bruit is not present.  Cardiovascular: Normal rate, regular rhythm, normal heart sounds, intact distal pulses and normal pulses.   Pulmonary/Chest: Breath sounds normal. No respiratory distress.  There is left lateral lower chest wall tenderness to palpation. No crepitus noted. No palpable deformity of any ribs. No significant hematoma appreciated.  Abdominal: Soft. Bowel sounds are normal. He exhibits no distension and no mass. There is no guarding.  Musculoskeletal: Normal range of motion.  Left lower paraspinal area tenderness. No palpable step off of the thoracic or lumbar spine area.  There is no shortening of the lower extremities. There's no deformity of the knees, tibial areas, or foot or ankle areas. The dorsalis pedis pulses 2+ bilaterally. Capillary refill is less than 2 seconds bilaterally.  Lymphadenopathy:       Head (right side): No submandibular adenopathy present.       Head (left side): No submandibular adenopathy present.    He has no cervical adenopathy.  Neurological: He is alert and oriented to person, place, and time. He has normal strength. No cranial nerve deficit or sensory deficit.  Skin: Skin is warm and dry.  Psychiatric: He has a normal mood and affect. His speech is normal.  Nursing note and vitals reviewed.   ED Course  Procedures (including critical care time) Labs Review Labs Reviewed - No data to display  Imaging Review No results found. I have personally reviewed and evaluated these images and lab results as part of my medical decision-making.   EKG Interpretation None      MDM  Vital signs are  well within normal limits. Pulse oximetry is 100% on room air. Complete blood count is well within normal limits. Basic metabolic panel is normal. Urine analysis is negative for blood in the urine, or any other abnormality. There is no acute or posttraumatic injury or deformity appreciated on CT abdomen pelvis with contrast. There is a mild disc bulge at the L4-L5 area.  Patient has multiple allergies to various medications. Patient did get some relief from IV Valium and oral Roxicodone.  Discussed in detail the findings at this time. Patient was provided ice packs. And prescription for Roxicodone and Robaxin given to the patient. The patient is strongly encouraged to see his primary physician for additional follow-up in the next 48 hours.    Final diagnoses:  Contusion of flank and back, initial encounter    *I have reviewed nursing notes, vital signs, and all appropriate lab and imaging results for this patient.[id number] [place], PA-C 08/17/15 [id number]  Benjiman Core, MD  08/17/15 1227 

## 2015-08-15 NOTE — ED Notes (Signed)
PA at bedside.

## 2015-08-15 NOTE — Discharge Instructions (Signed)
Your x-rays are negative for fracture, dislocation, or internal injury. Your urine is negative for hidden blood. Please use roxicodone and robaxin for discomfort. Apply an ice pack to the contusion area. See your MD  For follow up and recheck. Contusion A contusion is a deep bruise. Contusions happen when an injury causes bleeding under the skin. Signs of bruising include pain, puffiness (swelling), and discolored skin. The contusion may turn blue, purple, or yellow. HOME CARE   Put ice on the injured area.  Put ice in a plastic bag.  Place a towel between your skin and the bag.  Leave the ice on for 15-20 minutes, 03-04 times a day.  Only take medicine as told by your doctor.  Rest the injured area.  If possible, raise (elevate) the injured area to lessen puffiness. GET HELP RIGHT AWAY IF:   You have more bruising or puffiness.  You have pain that is getting worse.  Your puffiness or pain is not helped by medicine. MAKE SURE YOU:   Understand these instructions.  Will watch your condition.  Will get help right away if you are not doing well or get worse. Document Released: 05/18/2008 Document Revised: 02/22/2012 Document Reviewed: 10/05/2011 [facility] Patient Information 2015 [place], [place]. This information is not intended to replace advice given to you by your health care provider. Make sure you discuss any questions you have with your health care provider.

## 2015-08-15 NOTE — ED Notes (Addendum)
Pt states he was cutting a tree this morning and a limb kicked back and hit him in is left lower back. Pt states it feels more in the kidney area

## 2015-08-15 NOTE — ED Notes (Signed)
CT called, states they are looking at patients chart and he is next to have CT.

## 2015-09-05 DIAGNOSIS — L03113 Cellulitis of right upper limb: Secondary | ICD-10-CM | POA: Insufficient documentation

## 2015-11-20 ENCOUNTER — Emergency Department (HOSPITAL_COMMUNITY): Payer: Self-pay

## 2015-11-20 ENCOUNTER — Emergency Department (HOSPITAL_COMMUNITY)
Admission: EM | Admit: 2015-11-20 | Discharge: 2015-11-20 | Discharge status: Against Medical Advice | Payer: Self-pay | Attending: Emergency Medicine | Admitting: Emergency Medicine

## 2015-11-20 ENCOUNTER — Encounter (HOSPITAL_COMMUNITY): Payer: Self-pay

## 2015-11-20 DIAGNOSIS — Z792 Long term (current) use of antibiotics: Secondary | ICD-10-CM | POA: Insufficient documentation

## 2015-11-20 DIAGNOSIS — R1032 Left lower quadrant pain: Secondary | ICD-10-CM | POA: Insufficient documentation

## 2015-11-20 DIAGNOSIS — Z79899 Other long term (current) drug therapy: Secondary | ICD-10-CM | POA: Insufficient documentation

## 2015-11-20 DIAGNOSIS — N50812 Left testicular pain: Secondary | ICD-10-CM | POA: Insufficient documentation

## 2015-11-20 DIAGNOSIS — R52 Pain, unspecified: Secondary | ICD-10-CM

## 2015-11-20 DIAGNOSIS — Z880 Allergy status to penicillin: Secondary | ICD-10-CM | POA: Insufficient documentation

## 2015-11-20 DIAGNOSIS — R1030 Lower abdominal pain, unspecified: Secondary | ICD-10-CM

## 2015-11-20 HISTORY — DX: Calculus of kidney: N20.0

## 2015-11-20 NOTE — ED Notes (Signed)
Patient stated that he was upset that he had to wait so long. He stated that he been in pain, but denies asking for something for pain. Patient stated that he will go somewhere else.

## 2015-11-20 NOTE — ED Notes (Signed)
Pt reports pain in llq yesterday but reports today pain radiates into left testicle.  Pt says hurts to move.  Pt says feels better when he lays down.  Denies swelling.  Denies pain or burning with urination.

## 2015-11-20 NOTE — ED Provider Notes (Signed)
CSN: [id number]     Arrival date & time 11/20/15  1158 History   First MD Initiated Contact with Patient 11/20/15 1554     Chief Complaint  Patient presents with  . Groin Pain     HPI Pt was seen at 1605. Per pt, c/o gradual onset and persistence of constant LLQ abd "pain" for the past 2 days. Pt states the pain began after he was "lifting heavy stones to make a fire pit." Describes the pain as "aching," worsens with palpation of the area and body position changes. States the pain radiates into his left testicle. Denies back pain, no N/V/D, no dysuria/hematuria, no fevers, no rash, no CP/SOB.    Past Medical History  Diagnosis Date  . Snake bite poisoning   . Bone infection (HCC)     spine  . Kidney stone    Past Surgical History  Procedure Laterality Date  . Exploratory laparotomy      Social History  Substance Use Topics  . Smoking status: Current Every Day Smoker -- 2.00 packs/day for 17 years    Types: Cigarettes  . Smokeless tobacco: Never Used  . Alcohol Use: No    Review of Systems ROS: Statement: All systems negative except as marked or noted in the HPI; Constitutional: Negative for fever and chills. ; ; Eyes: Negative for eye pain, redness and discharge. ; ; ENMT: Negative for ear pain, hoarseness, nasal congestion, sinus pressure and sore throat. ; ; Cardiovascular: Negative for chest pain, palpitations, diaphoresis, dyspnea and peripheral edema. ; ; Respiratory: Negative for cough, wheezing and stridor. ; ; Gastrointestinal: +abd pain. Negative for nausea, vomiting, diarrhea, blood in stool, hematemesis, jaundice and rectal bleeding. . ; ; Genitourinary: Negative for dysuria, flank pain and hematuria. ; ; Genital:  No penile drainage or rash, +left testicular pain, no swelling, no scrotal rash or swelling. ;; Musculoskeletal: Negative for back pain and neck pain. Negative for swelling and trauma.; ; Skin: Negative for pruritus, rash, abrasions, blisters, bruising and skin  lesion.; ; Neuro: Negative for headache, lightheadedness and neck stiffness. Negative for weakness, altered level of consciousness , altered mental status, extremity weakness, paresthesias, involuntary movement, seizure and syncope.      Allergies  Darvocet; Penicillins; Sulfa antibiotics; Toradol; Fentanyl; and Tylenol  Home Medications   Prior to Admission medications   Medication Sig Start Date End Date Taking? Authorizing Provider  doxycycline (VIBRAMYCIN) 100 MG capsule Take 1 capsule (100 mg total) by mouth 2 (two) times daily. 04/16/15   Blake DivineJohn Wofford, MD  enoxaparin (LOVENOX) 40 MG/0.4ML injection Inject 0.4 mLs (40 mg total) into the skin daily. 05/17/13   Vassie Lollarlos Madera, MD  levofloxacin (LEVAQUIN) 750 MG/150ML SOLN Inject 150 mLs (750 mg total) into the vein daily. 05/17/13   Vassie Lollarlos [place]era, MD  methocarbamol (ROBAXIN) 500 MG tablet Take 1 tablet (500 mg total) by mouth 3 (three) times daily. 08/15/15   Ivery QualeHobson Bryant, PA-C  nicotine (NICODERM CQ - DOSED IN MG/24 HOURS) 21 mg/24hr patch Place 1 patch onto the skin daily. 05/08/13   Leroy SeaPrashant K Singh, MD  ondansetron (ZOFRAN-ODT) 4 MG disintegrating tablet Take 1 tablet (4 mg total) by mouth once. 05/09/13   Toney SangJerrid Pippin, MD  oxyCODONE (ROXICODONE) 5 MG immediate release tablet Take 1 tablet (5 mg total) by mouth every 6 (six) hours as needed for severe pain. 08/15/15   Ivery QualeHobson Bryant, PA-C  pantoprazole (PROTONIX) 40 MG tablet Take 1 tablet (40 mg total) by mouth daily. 05/12/13  Kela Millin, MD  polyethylene glycol (MIRALAX / GLYCOLAX) packet Take 17 g by mouth daily as needed (constipation). 05/08/13   Leroy Sea, MD  sodium chloride 0.9 % infusion Inject 20 mLs into the vein continuous. 05/17/13   Vassie Loll, MD  vancomycin (VANCOCIN) 1 GM/200ML SOLN Inject 200 mLs (1,000 mg total) into the vein every 8 (eight) hours. 05/17/13   Vassie Loll, MD   BP 140/70 mmHg  Pulse 89  Temp(Src) 98 F (36.7 C) (Oral)  Resp 14  Ht  (1.753  m)  Wt 165 lb (74.844 kg)  BMI 24.36 kg/m2  SpO2 100% Physical Exam  1610: Physical examination:  Nursing notes reviewed; Vital signs and O2 SAT reviewed;  Constitutional: Well developed, Well nourished, Well hydrated, In no acute distress; Head:  Normocephalic, atraumatic; Eyes: EOMI, PERRL, No scleral icterus; ENMT: Mouth and pharynx normal, Mucous membranes moist; Neck: Supple, Full range of motion, No lymphadenopathy; Cardiovascular: Regular rate and rhythm, No murmur, rub, or gallop; Respiratory: Breath sounds clear & equal bilaterally, No rales, rhonchi, wheezes.  Speaking full sentences with ease, Normal respiratory effort/excursion; Chest: Nontender, Movement normal; Abdomen: Soft, +LLQ tender to palp. No rebound or guarding. Nondistended, Normal bowel sounds; Genitourinary: No CVA tenderness. Genital exam performed with pt permission and ED Tech chaperone present during exam.  No perineal erythema.  No penile lesions or drainage.  No scrotal erythema, edema or tenderness to palp.  Normal testicular lie.  +generalized left testicular tenderness to palp, no edema, no rash, no palp masses.  +cremasteric reflexes bilat.  No inguinal LAN or palpable masses..;; Spine:  No midline CS, TS, LS tenderness. ;; Extremities: Pulses normal, No tenderness, No edema, No calf edema or asymmetry.; Neuro: AA&Ox3, Major CN grossly intact.  Speech clear. No gross focal motor or sensory deficits in extremities. Climbs on and off stretcher easily by himself. Gait steady.; Skin: Color normal, Warm, Dry.   ED Course  Procedures (including critical care time) Labs Review   Imaging Review  I have personally reviewed and evaluated these images and lab results as part of my medical decision-making.   EKG Interpretation None      MDM  MDM Reviewed: previous chart, nursing note and vitals Reviewed previous: labs Interpretation: ultrasound      US Scrotum 11/20/2015  CLINICAL DATA:  Left groin/ testicular  pain EXAM: SCROTAL ULTRASOUND DOPPLER ULTRASOUND OF THE TESTICLES TECHNIQUE: Complete ultrasound examination of the testicles, epididymis, and other scrotal structures was performed. Color and spectral Doppler ultrasound were also utilized to evaluate blood flow to the testicles. COMPARISON:  None. FINDINGS: Right testicle Measurements: 2.7 x 4.2 x 2.2 cm. No mass or microlithiasis visualized. Left testicle Measurements: 4.4 x 2.7 x 2.0 cm. No mass or microlithiasis visualized. Right epididymis:  Normal in size and appearance. Left epididymis:  Normal in size and appearance. Hydrocele:  None visualized. Varicocele:  Mild left varicocele. Pulsed Doppler interrogation of both testes demonstrates normal low resistance arterial and venous waveforms bilaterally. IMPRESSION: No testicular abnormality. Electronically Signed   By: Charlett Nose M.D.   On: 11/20/2015 15:49     1715:  Pt stated he did not want to wait any longer for his workup and left the ED.     Samuel Jester, DO 11/26/15 [phone]

## 2016-01-31 ENCOUNTER — Encounter (HOSPITAL_BASED_OUTPATIENT_CLINIC_OR_DEPARTMENT_OTHER): Payer: Self-pay | Admitting: *Deleted

## 2016-01-31 ENCOUNTER — Emergency Department (HOSPITAL_BASED_OUTPATIENT_CLINIC_OR_DEPARTMENT_OTHER)
Admission: EM | Admit: 2016-01-31 | Discharge: 2016-01-31 | Disposition: A | Discharge status: Home / Self Care | Payer: Self-pay | Attending: Emergency Medicine | Admitting: Emergency Medicine

## 2016-01-31 ENCOUNTER — Emergency Department (HOSPITAL_BASED_OUTPATIENT_CLINIC_OR_DEPARTMENT_OTHER): Payer: Self-pay

## 2016-01-31 DIAGNOSIS — R109 Unspecified abdominal pain: Secondary | ICD-10-CM | POA: Insufficient documentation

## 2016-01-31 DIAGNOSIS — Z87442 Personal history of urinary calculi: Secondary | ICD-10-CM | POA: Insufficient documentation

## 2016-01-31 DIAGNOSIS — Z880 Allergy status to penicillin: Secondary | ICD-10-CM | POA: Insufficient documentation

## 2016-01-31 DIAGNOSIS — R112 Nausea with vomiting, unspecified: Secondary | ICD-10-CM | POA: Insufficient documentation

## 2016-01-31 DIAGNOSIS — R197 Diarrhea, unspecified: Secondary | ICD-10-CM | POA: Insufficient documentation

## 2016-01-31 DIAGNOSIS — M545 Low back pain: Secondary | ICD-10-CM | POA: Insufficient documentation

## 2016-01-31 DIAGNOSIS — F17210 Nicotine dependence, cigarettes, uncomplicated: Secondary | ICD-10-CM | POA: Insufficient documentation

## 2016-01-31 MED ORDER — MORPHINE SULFATE (PF) 4 MG/ML IV SOLN
4.0000 mg | Freq: Once | INTRAVENOUS | Status: DC
  Filled 2016-01-31: qty 1

## 2016-01-31 MED ORDER — SODIUM CHLORIDE 0.9 % IV BOLUS (SEPSIS)
2000.0000 mL | Freq: Once | INTRAVENOUS | Status: AC
  Administered 2016-01-31: 2000 mL via INTRAVENOUS

## 2016-01-31 MED ORDER — ONDANSETRON HCL 4 MG/2ML IJ SOLN
4.0000 mg | Freq: Once | INTRAMUSCULAR | Status: AC
Start: 2016-01-31 — End: 2016-01-31
  Administered 2016-01-31: 4 mg via INTRAVENOUS
  Filled 2016-01-31: qty 2

## 2016-01-31 MED ORDER — PROMETHAZINE HCL 25 MG RE SUPP: 25.0000 mg | Freq: Four times a day (QID) | RECTAL | Status: DC | PRN

## 2016-01-31 NOTE — ED Notes (Signed)
Pt reports vomiting and fever x 2 days.  Ibuprofen at 1230 today.

## 2016-01-31 NOTE — ED Provider Notes (Signed)
CSN: [id number]     Arrival date & time 01/31/16  1549 History   First MD Initiated Contact with Patient 01/31/16 1610     Chief Complaint  Patient presents with  . Emesis      HPI Patient presents to the emergency department complaining of nausea vomiting and subjective fever at home over the past 2 days.  He reports abdominal cramping.  He reports some diarrhea.  He denies chest pain or shortness of breath.  He reports some mild right lateral back pain.  He denies midline upper lower back pain.  No weakness of his arms or legs.  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  He denies IV drug abuse.  Past Medical History  Diagnosis Date  . Snake bite poisoning   . Bone infection (HCC)     spine  . Kidney stone    Past Surgical History  Procedure Laterality Date  . Exploratory laparotomy     History reviewed. No pertinent family history. Social History  Substance Use Topics  . Smoking status: Current Every Day Smoker -- 0.50 packs/day for 17 years    Types: Cigarettes  . Smokeless tobacco: Never Used  . Alcohol Use: No    Review of Systems  All other systems reviewed and are negative.     Allergies  Darvocet; Penicillins; Sulfa antibiotics; Toradol; Fentanyl; and Tylenol  Home Medications   Prior to Admission medications   Not on File   BP 166/93 mmHg  Pulse 96  Temp(Src) 99.1 F (37.3 C) (Oral)  Resp 18  Ht  (1.753 m)  Wt 172 lb (78.019 kg)  BMI 25.39 kg/m2  SpO2 100% Physical Exam  Constitutional: He is oriented to person, place, and time. He appears well-developed and well-nourished.  HENT:  Head: Normocephalic and atraumatic.  Eyes: EOM are normal.  Neck: Normal range of motion.  Cardiovascular: Normal rate, regular rhythm, normal heart sounds and intact distal pulses.   Pulmonary/Chest: Effort normal and breath sounds normal. No respiratory distress.  Abdominal: Soft. He exhibits no distension.  There is no tenderness.  Musculoskeletal: Normal range of motion.  No thoracic or lumbar point tenderness.  No thoracic rash noted.  Neurological: He is alert and oriented to person, place, and time.  Skin: Skin is warm and dry.  Psychiatric: He has a normal mood and affect. Judgment normal.  Nursing note and vitals reviewed.   ED Course  Procedures (including critical care time) Labs Review Labs Reviewed - No data to display  Imaging Review Dg Chest 2 View  01/31/2016  CLINICAL DATA:  Pt states SOB and productive cough with green, clear, and black sputum x3-4 days. In last 24hrs pt reports vomiting, weakness, and fever. Pt reports lower right side back pain x3-4 days with worsening intensity in last 24hrs. EXAM: CHEST  2 VIEW COMPARISON:  05/18/2013 FINDINGS: The heart size and mediastinal contours are within normal limits. Both lungs are clear. No pleural effusion or pneumothorax. Mild degenerative changes the mid lower thoracic spine. IMPRESSION: No active cardiopulmonary disease. Electronically Signed   By: Amie Portland M.D.   On: 01/31/2016 16:31   I have personally reviewed and evaluated these images and lab results as part of my medical decision-making.   EKG Interpretation None      MDM   Final diagnoses:  None    6:55 PM Patient feels much better after IV fluids.  Discharge home in good condition.  Likely viral illness.  Patient understands to return the emergency department for new or worsening symptoms   Azalia Bilis, MD 01/31/16 [phone]

## 2016-01-31 NOTE — Discharge Instructions (Signed)

## 2016-01-31 NOTE — ED Notes (Signed)
Morphine d/v d/t no ride

## 2016-01-31 NOTE — ED Notes (Signed)
Pt called from pharmacy requesting to change phenergan rx from suppositories to pills. Pt states was unable to afford zofran so he asked for phenergan but was unaware being suppositories. States unable to use suppositories d/t his hemorrhoids. Pt was educated the reason for suppositories are for his n/v and pills would not be beneficial. Pt was getting very agitated over the phone and states he will come back when he gets dehydrated because he "can not and will not" use the suppositories. EDP Campos notified of call and stated pt has hx of drug abuse and will not change rx to PO. Diane Sidonia Nutter,RN CN

## 2016-01-31 NOTE — ED Notes (Signed)
MD at bedside. 

## 2016-02-29 ENCOUNTER — Emergency Department (HOSPITAL_BASED_OUTPATIENT_CLINIC_OR_DEPARTMENT_OTHER)
Admission: EM | Admit: 2016-02-29 | Discharge: 2016-02-29 | Disposition: A | Discharge status: Home / Self Care | Payer: Self-pay | Attending: Emergency Medicine | Admitting: Emergency Medicine

## 2016-02-29 ENCOUNTER — Emergency Department (HOSPITAL_BASED_OUTPATIENT_CLINIC_OR_DEPARTMENT_OTHER): Payer: Self-pay

## 2016-02-29 ENCOUNTER — Encounter (HOSPITAL_BASED_OUTPATIENT_CLINIC_OR_DEPARTMENT_OTHER): Payer: Self-pay | Admitting: *Deleted

## 2016-02-29 DIAGNOSIS — R197 Diarrhea, unspecified: Secondary | ICD-10-CM | POA: Insufficient documentation

## 2016-02-29 DIAGNOSIS — K921 Melena: Secondary | ICD-10-CM | POA: Insufficient documentation

## 2016-02-29 DIAGNOSIS — Z8739 Personal history of other diseases of the musculoskeletal system and connective tissue: Secondary | ICD-10-CM | POA: Insufficient documentation

## 2016-02-29 DIAGNOSIS — Z880 Allergy status to penicillin: Secondary | ICD-10-CM | POA: Insufficient documentation

## 2016-02-29 DIAGNOSIS — F17210 Nicotine dependence, cigarettes, uncomplicated: Secondary | ICD-10-CM | POA: Insufficient documentation

## 2016-02-29 DIAGNOSIS — Z87442 Personal history of urinary calculi: Secondary | ICD-10-CM | POA: Insufficient documentation

## 2016-02-29 DIAGNOSIS — Z9889 Other specified postprocedural states: Secondary | ICD-10-CM | POA: Insufficient documentation

## 2016-02-29 DIAGNOSIS — M545 Low back pain: Secondary | ICD-10-CM | POA: Insufficient documentation

## 2016-02-29 DIAGNOSIS — Z87828 Personal history of other (healed) physical injury and trauma: Secondary | ICD-10-CM | POA: Insufficient documentation

## 2016-02-29 LAB — COMPREHENSIVE METABOLIC PANEL
ALK PHOS: 157 U/L — AB (ref 38–126)
ALT: 1252 U/L — AB (ref 17–63)
AST: 493 U/L — AB (ref 15–41)
Albumin: 4.2 g/dL (ref 3.5–5.0)
Anion gap: 10 (ref 5–15)
BUN: 14 mg/dL (ref 6–20)
CALCIUM: 8.7 mg/dL — AB (ref 8.9–10.3)
CHLORIDE: 104 mmol/L (ref 101–111)
CO2: 25 mmol/L (ref 22–32)
CREATININE: 0.78 mg/dL (ref 0.61–1.24)
Glucose, Bld: 113 mg/dL — ABNORMAL HIGH (ref 65–99)
Potassium: 3.8 mmol/L (ref 3.5–5.1)
SODIUM: 139 mmol/L (ref 135–145)
Total Bilirubin: 2 mg/dL — ABNORMAL HIGH (ref 0.3–1.2)
Total Protein: 7.2 g/dL (ref 6.5–8.1)

## 2016-02-29 LAB — URINE MICROSCOPIC-ADD ON

## 2016-02-29 LAB — CBC WITH DIFFERENTIAL/PLATELET
BASOS ABS: 0 10*3/uL (ref 0.0–0.1)
Basophils Relative: 0 %
EOS PCT: 1 %
Eosinophils Absolute: 0.1 10*3/uL (ref 0.0–0.7)
HCT: 43 % (ref 39.0–52.0)
HEMOGLOBIN: 15.1 g/dL (ref 13.0–17.0)
LYMPHS ABS: 8.1 10*3/uL — AB (ref 0.7–4.0)
Lymphocytes Relative: 65 %
MCH: 31.1 pg (ref 26.0–34.0)
MCHC: 35.1 g/dL (ref 30.0–36.0)
MCV: 88.5 fL (ref 78.0–100.0)
MONOS PCT: 6 %
Monocytes Absolute: 0.8 10*3/uL (ref 0.1–1.0)
NEUTROS PCT: 28 %
Neutro Abs: 3.5 10*3/uL (ref 1.7–7.7)
PLATELETS: 107 10*3/uL — AB (ref 150–400)
RBC: 4.86 MIL/uL (ref 4.22–5.81)
RDW: 13.1 % (ref 11.5–15.5)
WBC: 12.5 10*3/uL — AB (ref 4.0–10.5)

## 2016-02-29 LAB — URINALYSIS, ROUTINE W REFLEX MICROSCOPIC
GLUCOSE, UA: NEGATIVE mg/dL
KETONES UR: NEGATIVE mg/dL
Nitrite: POSITIVE — AB
PH: 7 (ref 5.0–8.0)
PROTEIN: 30 mg/dL — AB
Specific Gravity, Urine: 1.023 (ref 1.005–1.030)

## 2016-02-29 LAB — OCCULT BLOOD X 1 CARD TO LAB, STOOL: Fecal Occult Bld: POSITIVE — AB

## 2016-02-29 MED ORDER — HYDROMORPHONE HCL 1 MG/ML IJ SOLN: 1.0000 mg | Freq: Once | INTRAMUSCULAR | Status: DC

## 2016-02-29 MED ORDER — HYDROMORPHONE HCL 1 MG/ML IJ SOLN
1.0000 mg | Freq: Once | INTRAMUSCULAR | Status: AC
  Administered 2016-02-29: 1 mg via INTRAVENOUS
  Filled 2016-02-29: qty 1

## 2016-02-29 MED ORDER — IOHEXOL 300 MG/ML  SOLN
25.0000 mL | Freq: Once | INTRAMUSCULAR | Status: AC | PRN
  Administered 2016-02-29: 25 mL via ORAL

## 2016-02-29 MED ORDER — CIPROFLOXACIN IN D5W 400 MG/200ML IV SOLN
400.0000 mg | Freq: Once | INTRAVENOUS | Status: AC
  Administered 2016-02-29: 400 mg via INTRAVENOUS
  Filled 2016-02-29: qty 200

## 2016-02-29 MED ORDER — OXYCODONE HCL 5 MG PO TABS
5.0000 mg | ORAL_TABLET | ORAL | Status: DC | PRN
Start: 2016-02-29 — End: 2016-03-19

## 2016-02-29 MED ORDER — CIPROFLOXACIN HCL 500 MG PO TABS: 500.0000 mg | ORAL_TABLET | Freq: Two times a day (BID) | ORAL | Status: DC

## 2016-02-29 MED ORDER — ONDANSETRON HCL 4 MG/2ML IJ SOLN
4.0000 mg | Freq: Once | INTRAMUSCULAR | Status: AC
  Administered 2016-02-29: 4 mg via INTRAVENOUS
  Filled 2016-02-29: qty 2

## 2016-02-29 MED ORDER — SODIUM CHLORIDE 0.9 % IV BOLUS (SEPSIS)
1000.0000 mL | Freq: Once | INTRAVENOUS | Status: AC
Start: 2016-02-29 — End: 2016-02-29
  Administered 2016-02-29: 1000 mL via INTRAVENOUS

## 2016-02-29 MED ORDER — IOHEXOL 300 MG/ML  SOLN
100.0000 mL | Freq: Once | INTRAMUSCULAR | Status: AC | PRN
  Administered 2016-02-29: 100 mL via INTRAVENOUS

## 2016-02-29 NOTE — ED Notes (Signed)
Pt alert, NAD, calm, interactive, resps e/u, steady gait, "feel better", rates pain 6/10, denies sx other than pain.
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
## 2016-02-29 NOTE — ED Notes (Signed)
Patient transported to CT 

## 2016-02-29 NOTE — ED Notes (Signed)
MD at bedside for rectal exam with chaperone present 

## 2016-02-29 NOTE — ED Notes (Signed)
Pt reports back pain since last night.  Reports that the pain has moved from his back into his flank.  Reports bloody diarrhea x 1 this morning.

## 2016-03-02 LAB — PATHOLOGIST SMEAR REVIEW

## 2016-03-03 ENCOUNTER — Encounter (HOSPITAL_COMMUNITY): Payer: Self-pay | Admitting: *Deleted

## 2016-03-03 ENCOUNTER — Emergency Department (HOSPITAL_COMMUNITY)
Admission: EM | Admit: 2016-03-03 | Discharge: 2016-03-03 | Disposition: A | Discharge status: Home / Self Care | Payer: Self-pay | Attending: Emergency Medicine | Admitting: Emergency Medicine

## 2016-03-03 DIAGNOSIS — K625 Hemorrhage of anus and rectum: Secondary | ICD-10-CM | POA: Insufficient documentation

## 2016-03-03 DIAGNOSIS — F17210 Nicotine dependence, cigarettes, uncomplicated: Secondary | ICD-10-CM | POA: Insufficient documentation

## 2016-03-03 NOTE — ED Notes (Signed)
[facility], did rectal exam and CT but unknown bleeding.  Pt called to have colonoscopy and states the bleeding has got worse and referred here. Pt states he is having lower abdominal pain, reports history of abdominal surgery.  Pt states dark red blood

## 2016-03-03 NOTE — ED Notes (Signed)
Pt reports that he is leaving due to wait time. PT advised of risks of leaving. Pt still chooses to leave prior to seeing physician.

## 2016-03-08 ENCOUNTER — Encounter (HOSPITAL_BASED_OUTPATIENT_CLINIC_OR_DEPARTMENT_OTHER): Payer: Self-pay | Admitting: Emergency Medicine

## 2016-03-08 DIAGNOSIS — Z888 Allergy status to other drugs, medicaments and biological substances status: Secondary | ICD-10-CM

## 2016-03-08 DIAGNOSIS — Z886 Allergy status to analgesic agent status: Secondary | ICD-10-CM

## 2016-03-08 DIAGNOSIS — I82619 Acute embolism and thrombosis of superficial veins of unspecified upper extremity: Secondary | ICD-10-CM | POA: Diagnosis not present

## 2016-03-08 DIAGNOSIS — Z22322 Carrier or suspected carrier of Methicillin resistant Staphylococcus aureus: Secondary | ICD-10-CM

## 2016-03-08 DIAGNOSIS — Z881 Allergy status to other antibiotic agents status: Secondary | ICD-10-CM

## 2016-03-08 DIAGNOSIS — F17210 Nicotine dependence, cigarettes, uncomplicated: Secondary | ICD-10-CM | POA: Diagnosis present

## 2016-03-08 DIAGNOSIS — R001 Bradycardia, unspecified: Secondary | ICD-10-CM | POA: Diagnosis present

## 2016-03-08 DIAGNOSIS — F1994 Other psychoactive substance use, unspecified with psychoactive substance-induced mood disorder: Secondary | ICD-10-CM | POA: Diagnosis present

## 2016-03-08 DIAGNOSIS — Z885 Allergy status to narcotic agent status: Secondary | ICD-10-CM

## 2016-03-08 DIAGNOSIS — Z880 Allergy status to penicillin: Secondary | ICD-10-CM

## 2016-03-08 DIAGNOSIS — K559 Vascular disorder of intestine, unspecified: Principal | ICD-10-CM | POA: Diagnosis present

## 2016-03-08 DIAGNOSIS — Z765 Malingerer [conscious simulation]: Secondary | ICD-10-CM

## 2016-03-08 DIAGNOSIS — F1120 Opioid dependence, uncomplicated: Secondary | ICD-10-CM | POA: Diagnosis present

## 2016-03-08 DIAGNOSIS — B1920 Unspecified viral hepatitis C without hepatic coma: Secondary | ICD-10-CM | POA: Diagnosis present

## 2016-03-08 DIAGNOSIS — I82621 Acute embolism and thrombosis of deep veins of right upper extremity: Secondary | ICD-10-CM | POA: Diagnosis not present

## 2016-03-08 DIAGNOSIS — K5790 Diverticulosis of intestine, part unspecified, without perforation or abscess without bleeding: Secondary | ICD-10-CM | POA: Diagnosis present

## 2016-03-08 NOTE — ED Notes (Signed)
Patient states that he was here for rectal bleeding last week, was told to schedual a colonoscopy, the patient reports that the bleeding is getting worse. The patient denies any lightheaded, or dizziness

## 2016-03-09 ENCOUNTER — Inpatient Hospital Stay (HOSPITAL_BASED_OUTPATIENT_CLINIC_OR_DEPARTMENT_OTHER)
Admission: EM | Admit: 2016-03-09 | Discharge: 2016-03-19 | DRG: 394 | Disposition: A | Discharge status: Home / Self Care | Payer: Self-pay | Attending: Internal Medicine | Admitting: Internal Medicine

## 2016-03-09 ENCOUNTER — Inpatient Hospital Stay (HOSPITAL_COMMUNITY): Payer: Self-pay

## 2016-03-09 ENCOUNTER — Encounter (HOSPITAL_COMMUNITY): Payer: Self-pay | Admitting: Family Medicine

## 2016-03-09 DIAGNOSIS — R509 Fever, unspecified: Secondary | ICD-10-CM | POA: Diagnosis present

## 2016-03-09 DIAGNOSIS — M4624 Osteomyelitis of vertebra, thoracic region: Secondary | ICD-10-CM | POA: Diagnosis present

## 2016-03-09 DIAGNOSIS — F1120 Opioid dependence, uncomplicated: Secondary | ICD-10-CM | POA: Diagnosis present

## 2016-03-09 DIAGNOSIS — I82629 Acute embolism and thrombosis of deep veins of unspecified upper extremity: Secondary | ICD-10-CM | POA: Diagnosis present

## 2016-03-09 DIAGNOSIS — K759 Inflammatory liver disease, unspecified: Secondary | ICD-10-CM

## 2016-03-09 DIAGNOSIS — Z720 Tobacco use: Secondary | ICD-10-CM | POA: Diagnosis present

## 2016-03-09 DIAGNOSIS — F1994 Other psychoactive substance use, unspecified with psychoactive substance-induced mood disorder: Secondary | ICD-10-CM

## 2016-03-09 DIAGNOSIS — F1990 Other psychoactive substance use, unspecified, uncomplicated: Secondary | ICD-10-CM | POA: Diagnosis present

## 2016-03-09 DIAGNOSIS — R609 Edema, unspecified: Secondary | ICD-10-CM

## 2016-03-09 DIAGNOSIS — R1031 Right lower quadrant pain: Secondary | ICD-10-CM | POA: Diagnosis present

## 2016-03-09 DIAGNOSIS — R1011 Right upper quadrant pain: Secondary | ICD-10-CM

## 2016-03-09 DIAGNOSIS — F0630 Mood disorder due to known physiological condition, unspecified: Secondary | ICD-10-CM

## 2016-03-09 DIAGNOSIS — F1920 Other psychoactive substance dependence, uncomplicated: Secondary | ICD-10-CM | POA: Diagnosis present

## 2016-03-09 DIAGNOSIS — Z765 Malingerer [conscious simulation]: Secondary | ICD-10-CM

## 2016-03-09 DIAGNOSIS — K625 Hemorrhage of anus and rectum: Secondary | ICD-10-CM

## 2016-03-09 DIAGNOSIS — K922 Gastrointestinal hemorrhage, unspecified: Secondary | ICD-10-CM

## 2016-03-09 HISTORY — DX: Laceration without foreign body of abdominal wall, unspecified quadrant without penetration into peritoneal cavity, initial encounter: S31.119A

## 2016-03-09 HISTORY — DX: Malingerer (conscious simulation): Z76.5

## 2016-03-09 HISTORY — DX: Unspecified appendicitis: K37

## 2016-03-09 LAB — CBC
HCT: 42.3 % (ref 39.0–52.0)
Hemoglobin: 14.7 g/dL (ref 13.0–17.0)
MCH: 31.4 pg (ref 26.0–34.0)
MCHC: 34.8 g/dL (ref 30.0–36.0)
MCV: 90.4 fL (ref 78.0–100.0)
PLATELETS: 208 10*3/uL (ref 150–400)
RBC: 4.68 MIL/uL (ref 4.22–5.81)
RDW: 13.2 % (ref 11.5–15.5)
WBC: 6.6 10*3/uL (ref 4.0–10.5)

## 2016-03-09 LAB — HEPATIC FUNCTION PANEL
ALT: 69 U/L — AB (ref 17–63)
AST: 24 U/L (ref 15–41)
Albumin: 3.4 g/dL — ABNORMAL LOW (ref 3.5–5.0)
Alkaline Phosphatase: 69 U/L (ref 38–126)
BILIRUBIN DIRECT: 0.3 mg/dL (ref 0.1–0.5)
BILIRUBIN INDIRECT: 1.3 mg/dL — AB (ref 0.3–0.9)
BILIRUBIN TOTAL: 1.6 mg/dL — AB (ref 0.3–1.2)
Total Protein: 6.2 g/dL — ABNORMAL LOW (ref 6.5–8.1)

## 2016-03-09 LAB — DIFFERENTIAL
BASOS ABS: 0 10*3/uL (ref 0.0–0.1)
BASOS PCT: 0 %
EOS ABS: 0.2 10*3/uL (ref 0.0–0.7)
Eosinophils Relative: 3 %
Lymphocytes Relative: 42 %
Lymphs Abs: 2.9 10*3/uL (ref 0.7–4.0)
MONO ABS: 0.8 10*3/uL (ref 0.1–1.0)
Monocytes Relative: 12 %
NEUTROS ABS: 3 10*3/uL (ref 1.7–7.7)
NEUTROS PCT: 43 %

## 2016-03-09 LAB — URINALYSIS, ROUTINE W REFLEX MICROSCOPIC
Bilirubin Urine: NEGATIVE
GLUCOSE, UA: NEGATIVE mg/dL
HGB URINE DIPSTICK: NEGATIVE
KETONES UR: NEGATIVE mg/dL
LEUKOCYTES UA: NEGATIVE
Nitrite: NEGATIVE
PH: 7 (ref 5.0–8.0)
Protein, ur: NEGATIVE mg/dL
Specific Gravity, Urine: 1.005 (ref 1.005–1.030)

## 2016-03-09 LAB — BASIC METABOLIC PANEL
ANION GAP: 7 (ref 5–15)
BUN: 7 mg/dL (ref 6–20)
CALCIUM: 8.7 mg/dL — AB (ref 8.9–10.3)
CO2: 22 mmol/L (ref 22–32)
Chloride: 111 mmol/L (ref 101–111)
Creatinine, Ser: 0.61 mg/dL (ref 0.61–1.24)
GLUCOSE: 113 mg/dL — AB (ref 65–99)
POTASSIUM: 3.6 mmol/L (ref 3.5–5.1)
SODIUM: 140 mmol/L (ref 135–145)

## 2016-03-09 LAB — C-REACTIVE PROTEIN: CRP: <0.5 mg/dL (ref <1.0)

## 2016-03-09 LAB — SEDIMENTATION RATE: Sed Rate: 10 mm/hr (ref 0–16)

## 2016-03-09 MED ORDER — TRAZODONE HCL 50 MG PO TABS
25.0000 mg | ORAL_TABLET | Freq: Every evening | ORAL | Status: DC | PRN
  Administered 2016-03-09 – 2016-03-19 (×10): 25 mg via ORAL
  Filled 2016-03-09 (×11): qty 1

## 2016-03-09 MED ORDER — OXYCODONE HCL ER 10 MG PO T12A
10.0000 mg | EXTENDED_RELEASE_TABLET | Freq: Once | ORAL | Status: AC
  Administered 2016-03-09: 10 mg via ORAL
  Filled 2016-03-09: qty 1

## 2016-03-09 MED ORDER — NICOTINE 14 MG/24HR TD PT24
14.0000 mg | MEDICATED_PATCH | Freq: Every day | TRANSDERMAL | Status: DC
  Administered 2016-03-09 – 2016-03-10 (×2): 14 mg via TRANSDERMAL
  Filled 2016-03-09 (×6): qty 1

## 2016-03-09 MED ORDER — PANTOPRAZOLE SODIUM 40 MG IV SOLR
40.0000 mg | Freq: Once | INTRAVENOUS | Status: AC
  Administered 2016-03-09: 40 mg via INTRAVENOUS
  Filled 2016-03-09: qty 40

## 2016-03-09 MED ORDER — SODIUM CHLORIDE 0.9 % IV SOLN
INTRAVENOUS | Status: DC
  Administered 2016-03-12 – 2016-03-16 (×2): via INTRAVENOUS

## 2016-03-09 MED ORDER — HYDROMORPHONE HCL 1 MG/ML IJ SOLN
1.0000 mg | Freq: Once | INTRAMUSCULAR | Status: AC
  Administered 2016-03-09: 1 mg via INTRAVENOUS
  Filled 2016-03-09: qty 1

## 2016-03-09 MED ORDER — SODIUM CHLORIDE 0.9 % IV BOLUS (SEPSIS)
1000.0000 mL | Freq: Once | INTRAVENOUS | Status: AC
  Administered 2016-03-09: 1000 mL via INTRAVENOUS

## 2016-03-09 MED ORDER — PEG 3350-KCL-NA BICARB-NACL 420 G PO SOLR
4000.0000 mL | Freq: Once | ORAL | Status: AC
  Administered 2016-03-10: 4000 mL via ORAL
  Filled 2016-03-09: qty 4000

## 2016-03-09 MED ORDER — OXYCODONE HCL 5 MG PO TABS
15.0000 mg | ORAL_TABLET | ORAL | Status: DC | PRN
  Administered 2016-03-09 – 2016-03-12 (×19): 15 mg via ORAL
  Filled 2016-03-09 (×19): qty 3

## 2016-03-09 MED ORDER — ONDANSETRON HCL 4 MG/2ML IJ SOLN
4.0000 mg | Freq: Three times a day (TID) | INTRAMUSCULAR | Status: AC | PRN
  Administered 2016-03-09: 4 mg via INTRAVENOUS
  Filled 2016-03-09: qty 2

## 2016-03-09 MED ORDER — ONDANSETRON HCL 4 MG/2ML IJ SOLN
4.0000 mg | Freq: Once | INTRAMUSCULAR | Status: AC
  Administered 2016-03-09: 4 mg via INTRAVENOUS
  Filled 2016-03-09: qty 2

## 2016-03-09 MED ORDER — SODIUM CHLORIDE 0.9 % IV SOLN
INTRAVENOUS | Status: DC
  Administered 2016-03-09 – 2016-03-10 (×3): via INTRAVENOUS
  Administered 2016-03-11: 100 mL/h via INTRAVENOUS
  Administered 2016-03-12: 12:00:00 via INTRAVENOUS

## 2016-03-09 MED ORDER — HYDROMORPHONE HCL 1 MG/ML IJ SOLN
1.0000 mg | INTRAMUSCULAR | Status: DC | PRN
  Administered 2016-03-09: 1 mg via INTRAVENOUS
  Filled 2016-03-09: qty 1

## 2016-03-09 MED ORDER — OXYCODONE HCL ER 20 MG PO T12A
20.0000 mg | EXTENDED_RELEASE_TABLET | Freq: Two times a day (BID) | ORAL | Status: DC
  Filled 2016-03-09: qty 1
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
## 2016-03-09 NOTE — ED Notes (Signed)
No changes, carelink here, VSS.  

## 2016-03-09 NOTE — ED Notes (Signed)
Dr. Molpus into room 
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
## 2016-03-09 NOTE — Progress Notes (Signed)
CSW provided pt's partner with a food voucher for dinner as she had not eaten today and had no money.  Pt very appreciative.

## 2016-03-09 NOTE — ED Notes (Signed)
Attempted report 

## 2016-03-09 NOTE — Progress Notes (Signed)
Urine culture sent to lab, as per order.

## 2016-03-09 NOTE — Plan of Care (Signed)
Called by Dr. Read DriversMolpus form [facility]: 35 yo M with RLQ abdominal pain and rectal bleeding.  Despite his young age does have a CT showing diverticulosis no diverticulitis a couple of days ago.  Cant get in to see GI until later next month so came back to ED.  HGB is 14.7 down from 15 a couple of days ago.  Severe abdominal pain in RLQ.  Patient going to tele bed.

## 2016-03-09 NOTE — Progress Notes (Addendum)
Diet orders state:  NO RED FOODS, NO RED LIQUIDS, NO RED OR ORANGE JELLOS, (as pt's chief complaint is gastrointestinal bleed).  However, dinner tray was still sent to pt with red jello x3, cherry [place] ice x3.  I notified and spoked to [facility]ie McNebb, [employer] Communications[employer]chen Supervisor, and she is now aware.

## 2016-03-10 ENCOUNTER — Encounter (HOSPITAL_COMMUNITY): Payer: Self-pay | Admitting: Anesthesiology

## 2016-03-10 ENCOUNTER — Inpatient Hospital Stay (HOSPITAL_COMMUNITY): Payer: Self-pay

## 2016-03-10 LAB — RAPID URINE DRUG SCREEN, HOSP PERFORMED
Amphetamines: NOT DETECTED
BARBITURATES: NOT DETECTED
Benzodiazepines: NOT DETECTED
Cocaine: POSITIVE — AB
Opiates: POSITIVE — AB
Tetrahydrocannabinol: NOT DETECTED

## 2016-03-10 LAB — COMPREHENSIVE METABOLIC PANEL
ALBUMIN: 3.4 g/dL — AB (ref 3.5–5.0)
ALK PHOS: 70 U/L (ref 38–126)
ALT: 59 U/L (ref 17–63)
ANION GAP: 7 (ref 5–15)
AST: 24 U/L (ref 15–41)
BILIRUBIN TOTAL: 1.7 mg/dL — AB (ref 0.3–1.2)
BUN: 6 mg/dL (ref 6–20)
CALCIUM: 8.8 mg/dL — AB (ref 8.9–10.3)
CO2: 25 mmol/L (ref 22–32)
Chloride: 108 mmol/L (ref 101–111)
Creatinine, Ser: 0.76 mg/dL (ref 0.61–1.24)
Glucose, Bld: 93 mg/dL (ref 65–99)
POTASSIUM: 3.7 mmol/L (ref 3.5–5.1)
Sodium: 140 mmol/L (ref 135–145)
TOTAL PROTEIN: 6 g/dL — AB (ref 6.5–8.1)

## 2016-03-10 LAB — HEPATITIS PANEL, ACUTE
HEP B C IGM: NEGATIVE
Hep A IgM: NEGATIVE
Hepatitis B Surface Ag: NEGATIVE

## 2016-03-10 LAB — CBC
HEMATOCRIT: 40.1 % (ref 39.0–52.0)
HEMOGLOBIN: 13.8 g/dL (ref 13.0–17.0)
MCH: 31.4 pg (ref 26.0–34.0)
MCHC: 34.4 g/dL (ref 30.0–36.0)
MCV: 91.3 fL (ref 78.0–100.0)
Platelets: 143 10*3/uL — ABNORMAL LOW (ref 150–400)
RBC: 4.39 MIL/uL (ref 4.22–5.81)
RDW: 13 % (ref 11.5–15.5)
WBC: 5.5 10*3/uL (ref 4.0–10.5)

## 2016-03-10 LAB — MRSA PCR SCREENING: MRSA BY PCR: POSITIVE — AB

## 2016-03-10 LAB — PROTIME-INR
INR: 1.04 (ref 0.00–1.49)
PROTHROMBIN TIME: 13.8 s (ref 11.6–15.2)

## 2016-03-10 LAB — HIV ANTIBODY (ROUTINE TESTING W REFLEX): HIV Screen 4th Generation wRfx: NONREACTIVE

## 2016-03-10 MED ORDER — SENNOSIDES-DOCUSATE SODIUM 8.6-50 MG PO TABS
2.0000 | ORAL_TABLET | Freq: Two times a day (BID) | ORAL | Status: DC
  Administered 2016-03-10 – 2016-03-11 (×3): 2 via ORAL
  Filled 2016-03-10 (×9): qty 2

## 2016-03-10 MED ORDER — SODIUM CHLORIDE 0.9 % IV SOLN
1250.0000 mg | Freq: Three times a day (TID) | INTRAVENOUS | Status: DC
  Administered 2016-03-11 – 2016-03-12 (×5): 1250 mg via INTRAVENOUS
  Filled 2016-03-10 (×6): qty 1250

## 2016-03-10 MED ORDER — MORPHINE SULFATE (PF) 2 MG/ML IV SOLN
1.0000 mg | Freq: Once | INTRAVENOUS | Status: DC
  Filled 2016-03-10: qty 1

## 2016-03-10 MED ORDER — METRONIDAZOLE IN NACL 5-0.79 MG/ML-% IV SOLN
500.0000 mg | Freq: Three times a day (TID) | INTRAVENOUS | Status: DC
  Administered 2016-03-10 – 2016-03-12 (×7): 500 mg via INTRAVENOUS
  Filled 2016-03-10 (×10): qty 100

## 2016-03-10 MED ORDER — SODIUM CHLORIDE 0.9 % IV BOLUS (SEPSIS)
1000.0000 mL | Freq: Once | INTRAVENOUS | Status: AC
  Administered 2016-03-10: 1000 mL via INTRAVENOUS

## 2016-03-10 MED ORDER — SODIUM CHLORIDE 0.9 % IV BOLUS (SEPSIS)
500.0000 mL | Freq: Once | INTRAVENOUS | Status: AC
  Administered 2016-03-10: 500 mL via INTRAVENOUS

## 2016-03-10 MED ORDER — CHLORHEXIDINE GLUCONATE CLOTH 2 % EX PADS
6.0000 | MEDICATED_PAD | Freq: Every day | CUTANEOUS | Status: DC
  Administered 2016-03-10 – 2016-03-14 (×3): 6 via TOPICAL

## 2016-03-10 MED ORDER — HYDROMORPHONE HCL 1 MG/ML IJ SOLN
0.5000 mg | Freq: Once | INTRAMUSCULAR | Status: AC
  Administered 2016-03-10: 0.5 mg via INTRAVENOUS
  Filled 2016-03-10: qty 1

## 2016-03-10 MED ORDER — IBUPROFEN 400 MG PO TABS
400.0000 mg | ORAL_TABLET | Freq: Four times a day (QID) | ORAL | Status: DC | PRN
  Administered 2016-03-10: 400 mg via ORAL
  Filled 2016-03-10 (×2): qty 1

## 2016-03-10 MED ORDER — HYDROMORPHONE HCL 1 MG/ML IJ SOLN
0.5000 mg | Freq: Once | INTRAMUSCULAR | Status: AC
Start: 2016-03-10 — End: 2016-03-10
  Administered 2016-03-10: 0.5 mg via INTRAVENOUS
  Filled 2016-03-10: qty 1

## 2016-03-10 MED ORDER — POLYETHYLENE GLYCOL 3350 17 G PO PACK
17.0000 g | PACK | Freq: Every day | ORAL | Status: DC
  Administered 2016-03-10: 17 g via ORAL
  Filled 2016-03-10 (×5): qty 1

## 2016-03-10 MED ORDER — IBUPROFEN 400 MG PO TABS
400.0000 mg | ORAL_TABLET | Freq: Once | ORAL | Status: AC
  Administered 2016-03-10: 400 mg via ORAL
  Filled 2016-03-10: qty 1

## 2016-03-10 MED ORDER — CIPROFLOXACIN IN D5W 400 MG/200ML IV SOLN
400.0000 mg | Freq: Two times a day (BID) | INTRAVENOUS | Status: DC
  Administered 2016-03-10 – 2016-03-12 (×5): 400 mg via INTRAVENOUS
  Filled 2016-03-10 (×6): qty 200

## 2016-03-10 MED ORDER — VANCOMYCIN HCL 10 G IV SOLR
1500.0000 mg | Freq: Once | INTRAVENOUS | Status: AC
  Administered 2016-03-10: 1500 mg via INTRAVENOUS
  Filled 2016-03-10: qty 1500

## 2016-03-10 MED ORDER — IBUPROFEN 800 MG PO TABS: 800.0000 mg | ORAL_TABLET | Freq: Once | ORAL | Status: DC

## 2016-03-10 MED ORDER — MUPIROCIN 2 % EX OINT
TOPICAL_OINTMENT | Freq: Two times a day (BID) | CUTANEOUS | Status: DC
  Administered 2016-03-11 – 2016-03-16 (×9): via NASAL
  Filled 2016-03-10 (×2): qty 22

## 2016-03-10 MED ORDER — IBUPROFEN 400 MG PO TABS
400.0000 mg | ORAL_TABLET | ORAL | Status: AC | PRN
  Administered 2016-03-10 – 2016-03-11 (×3): 400 mg via ORAL
  Filled 2016-03-10 (×3): qty 1

## 2016-03-10 NOTE — Progress Notes (Signed)
Orders placed 03/09/16 @ 0443 for pt to be non-telemetry.

## 2016-03-10 NOTE — Progress Notes (Signed)
Urine specimen sent for rapid drug screen, as ordered.  MRSA PCR nasal swab sent for testing, as ordered.

## 2016-03-10 NOTE — Progress Notes (Signed)
Pharmacy Antibiotic Note Carl Valencia is a 35 y.o. male admitted on 03/09/2016 with RLQ and recent bloody BM's. Initially started on Ciprofloxacin and flagyl on 3/28 am but with continued fevers pharmacy has been consulted to add vancomycin. Of note, patient has a hx of vertebral osteomyelitis in 2014.   Plan: 1. Vancomycin 1500 mg IV x 1 now followed by 1250 mg IV q 8 hours  2. If [facility]com[facility] continued [facility]; goal 15-20 3. Await pending micro data and other pertinent labs/test and narrow abx as feasible  4. Following along with you daily  Height: 5\' 9"  (175.3 cm) Weight:  (pt refused stand on wt.scale ) IBW/kg (Calculated) : 70.7  Temp (24hrs), Avg:101.1 F (38.4 C), Min:98.8 F (37.1 C), Max:103.2 F (39.6 C)   Recent Labs Lab 03/09/16 0208 03/09/16 0400 03/10/16 0246  WBC 6.6  --  5.5  CREATININE  --  0.61 0.76    Estimated Creatinine Clearance: 128.9 mL/min (by C-G formula based on Cr of 0.76).    Antimicrobials this admission: 3/28 Ciprofloxacin >>  3/28 Metronidazole  >>  3/28 Vancomycin >>   Dose adjustments this admission: n/a  Microbiology results: 3/28 BCx: px 3/27 UCx: ngtd  3/28 MRSA PCR: pos  Thank you for allowing pharmacy to be a part of this patient's care.  Carl Valencia 03/10/2016 4:52 PM

## 2016-03-10 NOTE — Progress Notes (Signed)
Patient ID: Linard MillersRaymond Racca, male   DOB: 29-Sep-1981, 35 y.o.   MRN: [id number] [facility] Progress [facility]  Linard MillersRaymond Needle 35 y.o. 29-Sep-1981   Subjective: Tremoring. Fevers. Denies abdominal pain/N/V. No BMs overnight.   Objective: Vital signs in last 24 hours: Filed Vitals:   03/10/16 0348 03/10/16 1100  BP: 173/73   Pulse: 46   Temp:  100.8 F (38.2 C)  Resp: 22     Physical Exam: Gen: alert, mild acute distress HEENT: anicteric sclera CV: RRR Chest: CTA B Abd: right-sided tenderness with guarding, soft, nondistended, +BS  Lab Results:  Recent Labs  03/09/16 0400 03/10/16 0246  NA 140 140  K 3.6 3.7  CL 111 108  CO2 22 25  GLUCOSE 113* 93  BUN 7 6  CREATININE 0.61 0.76  CALCIUM 8.7* 8.8*    Recent Labs  03/09/16 1049 03/10/16 0246  AST 24 24  ALT 69* 59  ALKPHOS 69 70  BILITOT 1.6* 1.7*  PROT 6.2* 6.0*  ALBUMIN 3.4* 3.4*    Recent Labs  03/09/16 0208 03/10/16 0246  WBC 6.6 5.5  NEUTROABS 3.0  --   HGB 14.7 13.8  HCT 42.3 40.1  MCV 90.4 91.3  PLT 208 143*    Recent Labs  03/10/16 0246  LABPROT 13.8  INR 1.04      Assessment/Plan: Abdominal pain with recent bloody diarrhea - no BMs since admit. Fever today of unclear etiology. Lab in room to obtain blood cultures. Colonoscopy planned for tomorrow. Clear liquid diet. NPO p MN.   Maree Ainley C. 03/10/2016, 11:52 AM  Pager [phone]  If no answer or after 5 PM call [phone]

## 2016-03-10 NOTE — Progress Notes (Signed)
Patient febrile with temp of 103.1. Objective symptoms: shivering, anxious, irritable. MD notified and made aware. Orders made. NS bolus 1000 mL given. Patient received cooling blanket but refused at the time. States that "I want to see how ibuprofen does first and then I will decide if I want to use it".
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
## 2016-03-11 ENCOUNTER — Encounter (HOSPITAL_COMMUNITY)
Admission: EM | Disposition: A | Discharge status: Home / Self Care | Payer: Self-pay | Source: Home / Self Care | Attending: Internal Medicine

## 2016-03-11 LAB — BASIC METABOLIC PANEL
ANION GAP: 9 (ref 5–15)
BUN: 6 mg/dL (ref 6–20)
CALCIUM: 8.8 mg/dL — AB (ref 8.9–10.3)
CO2: 21 mmol/L — AB (ref 22–32)
CREATININE: 0.93 mg/dL (ref 0.61–1.24)
Chloride: 111 mmol/L (ref 101–111)
Glucose, Bld: 108 mg/dL — ABNORMAL HIGH (ref 65–99)
Potassium: 3.7 mmol/L (ref 3.5–5.1)
Sodium: 141 mmol/L (ref 135–145)

## 2016-03-11 LAB — TSH: TSH: 0.466 u[IU]/mL (ref 0.350–4.500)

## 2016-03-11 LAB — URINE CULTURE: Culture: 3000

## 2016-03-11 LAB — CBC
HCT: 40.4 % (ref 39.0–52.0)
Hemoglobin: 14.1 g/dL (ref 13.0–17.0)
MCH: 31.3 pg (ref 26.0–34.0)
MCHC: 34.9 g/dL (ref 30.0–36.0)
MCV: 89.8 fL (ref 78.0–100.0)
PLATELETS: 95 10*3/uL — AB (ref 150–400)
RBC: 4.5 MIL/uL (ref 4.22–5.81)
RDW: 12.7 % (ref 11.5–15.5)
WBC: 5.8 10*3/uL (ref 4.0–10.5)

## 2016-03-11 LAB — MAGNESIUM: MAGNESIUM: 1.8 mg/dL (ref 1.7–2.4)

## 2016-03-11 SURGERY — COLONOSCOPY WITH PROPOFOL
Anesthesia: Monitor Anesthesia Care

## 2016-03-11 MED ORDER — SODIUM CHLORIDE 0.9 % IV BOLUS (SEPSIS)
500.0000 mL | Freq: Once | INTRAVENOUS | Status: AC
  Administered 2016-03-11: 500 mL via INTRAVENOUS

## 2016-03-11 MED ORDER — IBUPROFEN 400 MG PO TABS: 400.0000 mg | ORAL_TABLET | Freq: Once | ORAL | Status: DC

## 2016-03-11 MED ORDER — IBUPROFEN 400 MG PO TABS
400.0000 mg | ORAL_TABLET | ORAL | Status: DC | PRN
  Administered 2016-03-11 – 2016-03-18 (×18): 400 mg via ORAL
  Filled 2016-03-11 (×21): qty 1

## 2016-03-11 NOTE — Progress Notes (Signed)
Gastro MD notified that pt is scheduled to have Colonoscopy 3/29 and he is refusing bowel prep. Ilean SkillVeronica Reford Olliff LPN

## 2016-03-11 NOTE — Progress Notes (Signed)
Patient ID: Carl MillersRaymond Valencia, male   DOB: 06-21-1981, 35 y.o.   MRN: [id number] [facility] Progress Note  Carl MillersRaymond Valencia 35 y.o. 06-21-1981   Subjective: Appears to be resting comfortably and upon my arrival he starts to shiver and complain of abdominal pain. No BM since admit. Unable to tolerate prep due to vomiting.  Objective: Vital signs in last 24 hours: Filed Vitals:   03/11/16 0452 03/11/16 0651  BP:  130/80  Pulse:  70  Temp: 101.7 F (38.7 C) 99.1 F (37.3 C)  Resp:  20    Physical Exam: Gen: lethargic, +acute distress HEENT: anicteric CV:RRR Chest: CTA B Abd: diffusely tender with guarding, soft, nondistended, +BS  Lab Results:  Recent Labs  03/10/16 0246 03/11/16 0932  NA 140 141  K 3.7 3.7  CL 108 111  CO2 25 21*  GLUCOSE 93 108*  BUN 6 6  CREATININE 0.76 0.93  CALCIUM 8.8* 8.8*  MG  --  1.8    Recent Labs  03/09/16 1049 03/10/16 0246  AST 24 24  ALT 69* 59  ALKPHOS 69 70  BILITOT 1.6* 1.7*  PROT 6.2* 6.0*  ALBUMIN 3.4* 3.4*    Recent Labs  03/09/16 0208 03/10/16 0246 03/11/16 0932  WBC 6.6 5.5 5.8  NEUTROABS 3.0  --   --   HGB 14.7 13.8 14.1  HCT 42.3 40.1 40.4  MCV 90.4 91.3 89.8  PLT 208 143* 95*    Recent Labs  03/10/16 0246  LABPROT 13.8  INR 1.04      Assessment/Plan: Recent bloody diarrhea and abdominal pain that could have been from recent cocaine use. Patient became angry at that suggestion and stated that his use of cocaine this past weekend was the first time he used it since he was a teenager. Fever could be from withdrawing from drugs or from MRSA but may need an ID consult but defer to primary team. I do NOT think a colonoscopy is warranted at this time since he has not had any bleeding since admit and he has a positive UDS for cocaine, which could have caused an episode of ischemic colitis. Will not plan to reschedule the colonoscopy at this time. No further GI recs. Advance diet as tolerated. Will  sign off. Call if questions. GI f/u not needed.   Mairen Wallenstein C. 03/11/2016, 12:53 PM  Pager [phone]  If no answer or after 5 PM call [phone]

## 2016-03-11 NOTE — Progress Notes (Signed)
CRITICAL VALUE ALERT  Critical value received:  POS BC Gram + Cocci in Clusters  Date of notification:  03/11/2016  Time of notification:  0856  Critical value read back:Yes.    Nurse who received alert:  CJ RN  MD notified (1st page):  Vanessa BarbaraZamora  Time of first page:  [phone]  MD notified (2nd page):  Time of second page:  Responding MD:  Vanessa BarbaraZamora  Time MD responded:  [phone]

## 2016-03-11 NOTE — Progress Notes (Signed)
[facility] paged and informed that pt is running fevers and that his Ibuprofen order is complete and that pt is refusing cooling blanket and ice packs. New Orders received will continue to monitor. Ilean SkillVeronica Jaliyah Fotheringham LPN

## 2016-03-11 NOTE — Progress Notes (Signed)
Pt temp was rechecked 101.7 he refuse cooling blanket and Ibuprofen. Ilean SkillVeronica Caisen Mangas LPN
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
## 2016-03-12 ENCOUNTER — Inpatient Hospital Stay (HOSPITAL_COMMUNITY): Payer: Self-pay

## 2016-03-12 DIAGNOSIS — B1710 Acute hepatitis C without hepatic coma: Secondary | ICD-10-CM

## 2016-03-12 DIAGNOSIS — R509 Fever, unspecified: Secondary | ICD-10-CM

## 2016-03-12 DIAGNOSIS — Z8719 Personal history of other diseases of the digestive system: Secondary | ICD-10-CM

## 2016-03-12 LAB — CBC
HEMATOCRIT: 35.4 % — AB (ref 39.0–52.0)
Hemoglobin: 12.4 g/dL — ABNORMAL LOW (ref 13.0–17.0)
MCH: 30.5 pg (ref 26.0–34.0)
MCHC: 35 g/dL (ref 30.0–36.0)
MCV: 87 fL (ref 78.0–100.0)
Platelets: 86 10*3/uL — ABNORMAL LOW (ref 150–400)
RBC: 4.07 MIL/uL — ABNORMAL LOW (ref 4.22–5.81)
RDW: 12.5 % (ref 11.5–15.5)
WBC: 4.1 10*3/uL (ref 4.0–10.5)

## 2016-03-12 LAB — ECHOCARDIOGRAM COMPLETE: Weight: 2718.4 oz

## 2016-03-12 MED ORDER — OXYCODONE HCL 5 MG PO TABS
15.0000 mg | ORAL_TABLET | ORAL | Status: DC | PRN
  Administered 2016-03-12 – 2016-03-19 (×41): 15 mg via ORAL
  Filled 2016-03-12 (×42): qty 3

## 2016-03-12 MED ORDER — OXYCODONE HCL 5 MG PO TABS: 10.0000 mg | ORAL_TABLET | ORAL | Status: DC | PRN

## 2016-03-12 MED ORDER — SALINE SPRAY 0.65 % NA SOLN
1.0000 | NASAL | Status: DC | PRN
  Filled 2016-03-12: qty 44

## 2016-03-12 MED ORDER — ONDANSETRON HCL 4 MG/2ML IJ SOLN
4.0000 mg | Freq: Four times a day (QID) | INTRAMUSCULAR | Status: DC | PRN
  Administered 2016-03-12: 4 mg via INTRAVENOUS
  Filled 2016-03-12: qty 2

## 2016-03-12 NOTE — Progress Notes (Signed)
PLEASE DO NOT GIVE patient's Mother Riley NearingSusan Trump or Aunt Garret ReddishSherry Tennant ANY INFORMATION ABOUT PATIENT, per patient. Patient is adamant that these two individuals be told NOTHING if they call about him. Thank you .Marland Kitchen.Marland Kitchen..Marland Kitchen

## 2016-03-12 NOTE — Progress Notes (Signed)
Pt was very upset upon knowing that his antiobiotic IV was discontinued, explained to him per MD"s note that Dr. Luciana Axeomer infectious dse dr has them d/c, then pt complained of burning pain on his body, temp taken and its 102, he refused to take ibuprofen and states " I want my temp to shoot up so they would see". Pt ask for oxycodone and trazodone instead.

## 2016-03-12 NOTE — Clinical Social Work Note (Signed)
CSW met with patient at bedside to complete substance abuse assessment. The patient adamantly refuses to discuss his substance use with CSW and insists that he does not have a substance abuse problem. CSW signing off at this time.  Liz Beach MSW, [place], [place], [phone]

## 2016-03-12 NOTE — Progress Notes (Signed)
Echocardiogram 2D Echocardiogram has been performed.  Carl Valencia, Carl Valencia M 03/12/2016, 9:06 AM
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
## 2016-03-12 NOTE — Progress Notes (Signed)
Nurse tech Leonette Mostharles reported that pt refused VS, he doesn't want nurse tech to empty urinal and take dinner tray out.pt is rude toward nurse tech.
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
## 2016-03-12 NOTE — Progress Notes (Signed)
RN paged this NP because pt was refusing all treatment and meds because his antibiotics were discontinued today. RN explained to pt that ID has weighed in and since no focal site of infection has been identified after complete w/up, the abx were d/c'd today. Pt has a fever of 102F. He is refusing treatment for the fever as well. NP reviewed notes from today and agree with what RN told pt. NP asked RN to reiterate the above info and if pt continues to be non compliant, chart it. The pt is also reportedly rude to staff.  [facility] LL[facility], NP [facility]

## 2016-03-12 NOTE — Progress Notes (Signed)
RN noted that at 0730 when Carl ArtisKeisha, CNA, entered patient's room to introduce herself and write her name on the bed, patient "Shhhh'd" her and told her to leave his room, Carl Valencia was unable to get vital signs on him today.

## 2016-03-13 DIAGNOSIS — B349 Viral infection, unspecified: Secondary | ICD-10-CM

## 2016-03-13 LAB — CBC
HEMATOCRIT: 37.5 % — AB (ref 39.0–52.0)
Hemoglobin: 13.4 g/dL (ref 13.0–17.0)
MCH: 30.9 pg (ref 26.0–34.0)
MCHC: 35.7 g/dL (ref 30.0–36.0)
MCV: 86.4 fL (ref 78.0–100.0)
Platelets: 101 10*3/uL — ABNORMAL LOW (ref 150–400)
RBC: 4.34 MIL/uL (ref 4.22–5.81)
RDW: 12.4 % (ref 11.5–15.5)
WBC: 5.6 10*3/uL (ref 4.0–10.5)

## 2016-03-13 LAB — BASIC METABOLIC PANEL
Anion gap: 8 (ref 5–15)
BUN: <5 mg/dL — ABNORMAL LOW (ref 6–20)
CALCIUM: 8.7 mg/dL — AB (ref 8.9–10.3)
CO2: 21 mmol/L — AB (ref 22–32)
CREATININE: 0.9 mg/dL (ref 0.61–1.24)
Chloride: 107 mmol/L (ref 101–111)
GFR calc Af Amer: >60 mL/min (ref >60)
GFR calc non Af Amer: >60 mL/min (ref >60)
GLUCOSE: 107 mg/dL — AB (ref 65–99)
Potassium: 3.4 mmol/L — ABNORMAL LOW (ref 3.5–5.1)
Sodium: 136 mmol/L (ref 135–145)

## 2016-03-13 LAB — CULTURE, BLOOD (ROUTINE X 2)
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
## 2016-03-13 NOTE — Progress Notes (Signed)
    [facility]    TTE without concerns of vegetation, still with fever, no localizing symptoms except aches.  Blood culture with CoNS c/w contaminate.  Notes reviewed, refusing antipyretic.    C/w viral syndrome.  Supportive care, no role for antibiotics.    I will sign off, thanks.  Staci RighterOMER, Feliciano Wynter, MD

## 2016-03-14 DIAGNOSIS — R1031 Right lower quadrant pain: Secondary | ICD-10-CM

## 2016-03-14 DIAGNOSIS — Z8619 Personal history of other infectious and parasitic diseases: Secondary | ICD-10-CM

## 2016-03-14 DIAGNOSIS — R509 Fever, unspecified: Secondary | ICD-10-CM | POA: Diagnosis present

## 2016-03-14 LAB — CBC
HEMATOCRIT: 40 % (ref 39.0–52.0)
Hemoglobin: 13.8 g/dL (ref 13.0–17.0)
MCH: 30.1 pg (ref 26.0–34.0)
MCHC: 34.5 g/dL (ref 30.0–36.0)
MCV: 87.3 fL (ref 78.0–100.0)
PLATELETS: 112 10*3/uL — AB (ref 150–400)
RBC: 4.58 MIL/uL (ref 4.22–5.81)
RDW: 12.5 % (ref 11.5–15.5)
WBC: 6.6 10*3/uL (ref 4.0–10.5)

## 2016-03-14 LAB — BASIC METABOLIC PANEL
Anion gap: 9 (ref 5–15)
BUN: 7 mg/dL (ref 6–20)
CALCIUM: 8.5 mg/dL — AB (ref 8.9–10.3)
CHLORIDE: 105 mmol/L (ref 101–111)
CO2: 21 mmol/L — AB (ref 22–32)
Creatinine, Ser: 0.86 mg/dL (ref 0.61–1.24)
GFR calc non Af Amer: >60 mL/min (ref >60)
GLUCOSE: 85 mg/dL (ref 65–99)
Potassium: 3.7 mmol/L (ref 3.5–5.1)
SODIUM: 135 mmol/L (ref 135–145)

## 2016-03-14 NOTE — Progress Notes (Signed)
Subjective:  Lining of fevers and hurting all over especially on the left side of his body as well as right lower quadrant   Antibiotics:  Anti-infectives    Start     Dose/Rate Route Frequency Ordered Stop   03/11/16 0400  vancomycin (VANCOCIN) 1,250 mg in sodium chloride 0.9 % 250 mL IVPB  Status:  Discontinued     1,250 mg 166.7 mL/hr over 90 Minutes Intravenous Every 8 hours 03/10/16 1701 03/12/16 1346   03/10/16 1700  vancomycin (VANCOCIN) 1,500 mg in sodium chloride 0.9 % 500 mL IVPB     1,500 mg 250 mL/hr over 120 Minutes Intravenous  Once 03/10/16 1648 03/10/16 1922   03/10/16 0815  ciprofloxacin (CIPRO) IVPB 400 mg  Status:  Discontinued     400 mg 200 mL/hr over 60 Minutes Intravenous Every 12 hours 03/10/16 0806 03/12/16 1125   03/10/16 0815  metroNIDAZOLE (FLAGYL) IVPB 500 mg  Status:  Discontinued     500 mg 100 mL/hr over 60 Minutes Intravenous 3 times per day 03/10/16 0806 03/12/16 1125      Medications: Scheduled Meds: . Chlorhexidine Gluconate Cloth  6 each Topical Q0600  .  morphine injection  1 mg Intravenous Once  . mupirocin ointment   Nasal BID  . nicotine  14 mg Transdermal Daily  . polyethylene glycol  17 g Oral Daily  . senna-docusate  2 tablet Oral BID   Continuous Infusions: . sodium chloride 10 mL/hr at 03/12/16 1219   PRN Meds:.ibuprofen, ondansetron, oxyCODONE, sodium chloride, traZODone    Objective: Weight change:   Intake/Output Summary (Last 24 hours) at 03/14/16 1619 Last data filed at 03/14/16 1500  Gross per 24 hour  Intake   1142 ml  Output   1300 ml  Net   -158 ml   Blood pressure 126/67, pulse 61, temperature 98.7 F (37.1 C), temperature source Oral, resp. rate 15, height _0  (1.753 m), weight 169 lb 14.4 oz (77.066 kg), SpO2 96 %. Temp:  [98.7 F (37.1 C)-103 F (39.4 C)] 98.7 F (37.1 C) (04/01 1500) Pulse Rate:  [61-86] 61 (04/01 1302) Resp:  [15-17] 15 (04/01 1302) BP: (126-133)/(67-71) 126/67 mmHg  (04/01 1302) SpO2:  [96 %-99 %] 96 % (04/01 1302)  Physical Exam: General: Alert and awake, oriented x3, not in any acute distress.fairly flat affect HEENT: anicteric sclera,EOMI CVS regular rate, normal r,  no murmur rubs or gallops Chest: clear to auscultation bilaterally, no wheezing, rales or rhonchi Abdomen: soft nontender, nondistended, normal bowel sounds, Extremities: no  clubbing or edema noted bilaterally Skin: no rashes Neuro: nonfocal  CBC:  CBC Latest Ref Rng 03/14/2016 03/13/2016 03/12/2016  WBC 4.0 - 10.5 K/uL 6.6 5.6 4.1  Hemoglobin 13.0 - 17.0 g/dL 13.8 13.4 12.4(L)  Hematocrit 39.0 - 52.0 % 40.0 37.5(L) 35.4(L)  Platelets 150 - 400 K/uL 112(L) 101(L) 86(L)      BMET  Recent Labs  03/13/16 0541 03/14/16 0628  NA 136 135  K 3.4* 3.7  CL 107 105  CO2 21* 21*  GLUCOSE 107* 85  BUN <5* 7  CREATININE 0.90 0.86  CALCIUM 8.7* 8.5*     Liver Panel  No results for input(s): PROT, ALBUMIN, AST, ALT, ALKPHOS, BILITOT, BILIDIR, IBILI in the last 72 hours.     Sedimentation Rate No results for input(s): ESRSEDRATE in the last 72 hours. C-Reactive Protein No results for input(s): CRP in the last 72 hours.  Micro Results: Recent  Results (from the past 720 hour(s))  Urine culture     Status: None   Collection Time: 03/09/16  4:59 PM  Result Value Ref Range Status   Specimen Description URINE, CLEAN CATCH  Final   Special Requests NONE  Final   Culture 3,000 COLONIES/mL INSIGNIFICANT GROWTH  Final   Report Status 03/11/2016 FINAL  Final  Culture, blood (routine x 2)     Status: None (Preliminary result)   Collection Time: 03/10/16  4:12 AM  Result Value Ref Range Status   Specimen Description BLOOD RIGHT HAND  Final   Special Requests BOTTLES DRAWN AEROBIC ONLY 5CC  Final   Culture NO GROWTH 4 DAYS  Final   Report Status PENDING  Incomplete  Culture, blood (routine x 2)     Status: None   Collection Time: 03/10/16 11:55 AM  Result Value Ref Range  Status   Specimen Description BLOOD RIGHT ARM  Final   Special Requests IN PEDIATRIC BOTTLE 1.5CC  Final   Culture  Setup Time   Final    GRAM POSITIVE COCCI IN CLUSTERS IN PEDIATRIC BOTTLE CRITICAL RESULT CALLED TO, READ BACK BY AND VERIFIED WITH: C. THOMAS,RN AT 0848 ON [id number] BY Rhea Bleacher    Culture   Final    STAPHYLOCOCCUS SPECIES (COAGULASE NEGATIVE) THE SIGNIFICANCE OF ISOLATING THIS ORGANISM FROM A SINGLE SET OF BLOOD CULTURES WHEN MULTIPLE SETS ARE DRAWN IS UNCERTAIN. PLEASE NOTIFY THE MICROBIOLOGY DEPARTMENT WITHIN ONE WEEK IF SPECIATION AND SENSITIVITIES ARE REQUIRED.    Report Status 03/13/2016 FINAL  Final  Culture, blood (routine x 2)     Status: None (Preliminary result)   Collection Time: 03/10/16 11:59 AM  Result Value Ref Range Status   Specimen Description BLOOD RIGHT ARM  Final   Special Requests IN PEDIATRIC BOTTLE 1.5CC  Final   Culture NO GROWTH 4 DAYS  Final   Report Status PENDING  Incomplete  MRSA PCR Screening     Status: Abnormal   Collection Time: 03/10/16 12:36 PM  Result Value Ref Range Status   MRSA by PCR POSITIVE (A) NEGATIVE Final    Comment:        The GeneXpert MRSA Assay (FDA approved for NASAL specimens only), is one component of a comprehensive MRSA colonization surveillance program. It is not intended to diagnose MRSA infection nor to guide or monitor treatment for MRSA infections. RESULT CALLED TO, READ BACK BY AND VERIFIED WITH: L MOSS RN [id number] 03/10/16 A BROWNING     Studies/Results: No results found.    Assessment/Plan:  INTERVAL HISTORY:   03/14/16: still with high fevers   Principal Problem:   RLQ abdominal pain Active Problems:   Chronic narcotic dependence (HCC)   Hepatitis   Tobacco abuse   Osteomyelitis of thoracic region [facility])   Rectal bleed   Lower GI bleeding    Carl Valencia is a 35 y.o. male with  Hx of IVDU, MRSA bacteremia, thoracic diskitis, septic arthritis of [place] joint admitted with blood tinged  loose stools (that resolved subsequently) now with persistent FUO  #1 FUO:   --would get CT chest abdomen and pelvis with oral and IV contrast --repeat blood cultures --tox screen --check ESR, CRP --check respiratory virus panel --will consider further labs after we get results back from this initial imaging        LOS: 5 days   Alcide Evener 03/14/2016, 4:19 PM

## 2016-03-14 NOTE — Progress Notes (Signed)
Prn iboprufen administered twice today for episodes of fever. Urine specimen for drug screen sent to lab
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
## 2016-03-15 ENCOUNTER — Inpatient Hospital Stay (HOSPITAL_COMMUNITY): Payer: Self-pay

## 2016-03-15 ENCOUNTER — Encounter (HOSPITAL_COMMUNITY): Payer: Self-pay | Admitting: Radiology

## 2016-03-15 LAB — CBC
HCT: 39.7 % (ref 39.0–52.0)
Hemoglobin: 13.6 g/dL (ref 13.0–17.0)
MCH: 30 pg (ref 26.0–34.0)
MCHC: 34.3 g/dL (ref 30.0–36.0)
MCV: 87.4 fL (ref 78.0–100.0)
PLATELETS: 145 10*3/uL — AB (ref 150–400)
RBC: 4.54 MIL/uL (ref 4.22–5.81)
RDW: 12.3 % (ref 11.5–15.5)
WBC: 12.7 10*3/uL — AB (ref 4.0–10.5)

## 2016-03-15 LAB — COMPREHENSIVE METABOLIC PANEL
ALT: 23 U/L (ref 17–63)
AST: 22 U/L (ref 15–41)
Albumin: 3.2 g/dL — ABNORMAL LOW (ref 3.5–5.0)
Alkaline Phosphatase: 64 U/L (ref 38–126)
Anion gap: 13 (ref 5–15)
BILIRUBIN TOTAL: 1.9 mg/dL — AB (ref 0.3–1.2)
CHLORIDE: 101 mmol/L (ref 101–111)
CO2: 20 mmol/L — ABNORMAL LOW (ref 22–32)
CREATININE: 0.98 mg/dL (ref 0.61–1.24)
Calcium: 8.5 mg/dL — ABNORMAL LOW (ref 8.9–10.3)
GFR calc Af Amer: >60 mL/min (ref >60)
GLUCOSE: 126 mg/dL — AB (ref 65–99)
Potassium: 3.7 mmol/L (ref 3.5–5.1)
Sodium: 134 mmol/L — ABNORMAL LOW (ref 135–145)
Total Protein: 6.5 g/dL (ref 6.5–8.1)

## 2016-03-15 LAB — CULTURE, BLOOD (ROUTINE X 2)
Culture: NO GROWTH
Culture: NO GROWTH

## 2016-03-15 LAB — SEDIMENTATION RATE: Sed Rate: 30 mm/hr — ABNORMAL HIGH (ref 0–16)

## 2016-03-15 LAB — C-REACTIVE PROTEIN: CRP: 10 mg/dL — AB (ref <1.0)

## 2016-03-15 MED ORDER — IOPAMIDOL (ISOVUE-300) INJECTION 61%
INTRAVENOUS | Status: AC
  Administered 2016-03-15: 100 mL via INTRAVENOUS
  Filled 2016-03-15: qty 100

## 2016-03-15 MED ORDER — IOHEXOL 300 MG/ML  SOLN
25.0000 mL | INTRAMUSCULAR | Status: AC
  Administered 2016-03-15 (×2): 25 mL via INTRAVENOUS

## 2016-03-15 NOTE — Progress Notes (Signed)
I make rounds and introduce myself and the patient wants his pain meds at 2120 and his girlfriend called me if he can have the meds early before that I said the next schedule is 2133 pt was so mad and wants to talk to the charge nurse, I gave the oxycodone  15 mg, motrin and trazodone and he said  You will not be my nurse because accdg to the doctor they can give may pain meds 30 mins early and I'm delaying the meds schedule, I explained that I gave early as he requested still doesn't like me as a nurse, endorsed the report to our charge nurse Victorino DikeJennifer.
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
## 2016-03-15 NOTE — Progress Notes (Addendum)
Subjective:  C/o  Fevers, left sided pain  Antibiotics:  Anti-infectives    Start     Dose/Rate Route Frequency Ordered Stop   03/11/16 0400  vancomycin (VANCOCIN) 1,250 mg in sodium chloride 0.9 % 250 mL IVPB  Status:  Discontinued     1,250 mg 166.7 mL/hr over 90 Minutes Intravenous Every 8 hours 03/10/16 1701 03/12/16 1346   03/10/16 1700  vancomycin (VANCOCIN) 1,500 mg in sodium chloride 0.9 % 500 mL IVPB     1,500 mg 250 mL/hr over 120 Minutes Intravenous  Once 03/10/16 1648 03/10/16 1922   03/10/16 0815  ciprofloxacin (CIPRO) IVPB 400 mg  Status:  Discontinued     400 mg 200 mL/hr over 60 Minutes Intravenous Every 12 hours 03/10/16 0806 03/12/16 1125   03/10/16 0815  metroNIDAZOLE (FLAGYL) IVPB 500 mg  Status:  Discontinued     500 mg 100 mL/hr over 60 Minutes Intravenous 3 times per day 03/10/16 0806 03/12/16 1125      Medications: Scheduled Meds: . Chlorhexidine Gluconate Cloth  6 each Topical Q0600  .  morphine injection  1 mg Intravenous Once  . mupirocin ointment   Nasal BID  . nicotine  14 mg Transdermal Daily  . polyethylene glycol  17 g Oral Daily  . senna-docusate  2 tablet Oral BID   Continuous Infusions: . sodium chloride 10 mL/hr at 03/12/16 1219   PRN Meds:.ibuprofen, ondansetron, oxyCODONE, sodium chloride, traZODone    Objective: Weight change:   Intake/Output Summary (Last 24 hours) at 03/15/16 1904 Last data filed at 03/15/16 1837  Gross per 24 hour  Intake   1440 ml  Output    850 ml  Net    590 ml   Blood pressure 152/80, pulse 121, temperature 100.5 F (38.1 C), temperature source Oral, resp. rate 20, height  (1.753 m), weight 169 lb 14.4 oz (77.066 kg), SpO2 100 %. Temp:  [99.9 F (37.7 C)-102.7 F (39.3 C)] 100.5 F (38.1 C) (04/02 1410) Pulse Rate:  [83-121] 121 (04/02 1410) Resp:  [17-20] 20 (04/02 1410) BP: (127-152)/(66-83) 152/80 mmHg (04/02 1410) SpO2:  [98 %-100 %] 100 % (04/02 1410)  Physical  Exam: General: Alert and awake, oriented x3, not in any acute distress.fairly flat affect HEENT: anicteric sclera,EOMI CVS regular rate, normal r,  no murmur rubs or gallops Chest: clear to auscultation bilaterally, no wheezing, rales or rhonchi Abdomen: soft nontender, nondistended, normal bowel sounds, Extremities: no  clubbing or edema noted bilaterally Skin: no rashes Neuro: nonfocal  CBC:  CBC Latest Ref Rng 03/15/2016 03/14/2016 03/13/2016  WBC 4.0 - 10.5 K/uL 12.7(H) 6.6 5.6  Hemoglobin 13.0 - 17.0 g/dL 16.1 09.6 04.5  Hematocrit 39.0 - 52.0 % 39.7 40.0 37.5(L)  Platelets 150 - 400 K/uL 145(L) 112(L) 101(L)      BMET  Recent Labs  03/14/16 0628 03/15/16 0626  NA 135 134*  K 3.7 3.7  CL 105 101  CO2 21* 20*  GLUCOSE 85 126*  BUN 7 <5*  CREATININE 0.86 0.98  CALCIUM 8.5* 8.5*     Liver Panel   Recent Labs  03/15/16 0626  PROT 6.5  ALBUMIN 3.2*  AST 22  ALT 23  ALKPHOS 64  BILITOT 1.9*       Sedimentation Rate  Recent Labs  03/15/16 0626  ESRSEDRATE 30*   C-Reactive Protein  Recent Labs  03/15/16 0626  CRP 10.0*    Micro Results: Recent Results (from the  past 720 hour(s))  Urine culture     Status: None   Collection Time: 03/09/16  4:59 PM  Result Value Ref Range Status   Specimen Description URINE, CLEAN CATCH  Final   Special Requests NONE  Final   Culture 3,000 COLONIES/mL INSIGNIFICANT GROWTH  Final   Report Status 03/11/2016 FINAL  Final  Culture, blood (routine x 2)     Status: None   Collection Time: 03/10/16  4:12 AM  Result Value Ref Range Status   Specimen Description BLOOD RIGHT HAND  Final   Special Requests BOTTLES DRAWN AEROBIC ONLY 5CC  Final   Culture NO GROWTH 5 DAYS  Final   Report Status 03/15/2016 FINAL  Final  Culture, blood (routine x 2)     Status: None   Collection Time: 03/10/16 11:55 AM  Result Value Ref Range Status   Specimen Description BLOOD RIGHT ARM  Final   Special Requests IN PEDIATRIC BOTTLE  1.5CC  Final   Culture  Setup Time   Final    GRAM POSITIVE COCCI IN CLUSTERS IN PEDIATRIC BOTTLE CRITICAL RESULT CALLED TO, READ BACK BY AND VERIFIED WITH: C. THOMAS,RN AT 0848 ON [id number] BY Lucienne CapersS. YARBROUGH    Culture   Final    STAPHYLOCOCCUS SPECIES (COAGULASE NEGATIVE) THE SIGNIFICANCE OF ISOLATING THIS ORGANISM FROM A SINGLE SET OF BLOOD CULTURES WHEN MULTIPLE SETS ARE DRAWN IS UNCERTAIN. PLEASE NOTIFY THE MICROBIOLOGY DEPARTMENT WITHIN ONE WEEK IF SPECIATION AND SENSITIVITIES ARE REQUIRED.    Report Status 03/13/2016 FINAL  Final  Culture, blood (routine x 2)     Status: None   Collection Time: 03/10/16 11:59 AM  Result Value Ref Range Status   Specimen Description BLOOD RIGHT ARM  Final   Special Requests IN PEDIATRIC BOTTLE 1.5CC  Final   Culture NO GROWTH 5 DAYS  Final   Report Status 03/15/2016 FINAL  Final  MRSA PCR Screening     Status: Abnormal   Collection Time: 03/10/16 12:36 PM  Result Value Ref Range Status   MRSA by PCR POSITIVE (A) NEGATIVE Final    Comment:        The GeneXpert MRSA Assay (FDA approved for NASAL specimens only), is one component of a comprehensive MRSA colonization surveillance program. It is not intended to diagnose MRSA infection nor to guide or monitor treatment for MRSA infections. RESULT CALLED TO, READ BACK BY AND VERIFIED WITH: L MOSS RN [id number] 03/10/16 A BROWNING   Culture, blood (Routine X 2) w Reflex to ID Panel     Status: None (Preliminary result)   Collection Time: 03/14/16  4:24 PM  Result Value Ref Range Status   Specimen Description BLOOD LEFT HAND  Final   Special Requests BOTTLES DRAWN AEROBIC ONLY 4CC  Final   Culture NO GROWTH < 24 HOURS  Final   Report Status PENDING  Incomplete  Culture, blood (Routine X 2) w Reflex to ID Panel     Status: None (Preliminary result)   Collection Time: 03/14/16  4:27 PM  Result Value Ref Range Status   Specimen Description BLOOD RIGHT HAND  Final   Special Requests BOTTLES DRAWN AEROBIC  ONLY 5CC  Final   Culture NO GROWTH < 24 HOURS  Final   Report Status PENDING  Incomplete    Studies/Results: Ct Chest W Contrast  03/15/2016  CLINICAL DATA:  35 year old male with fever of unknown origin, right and abdominal pelvic pain, nausea and GI bleed. EXAM: CT CHEST, ABDOMEN, AND PELVIS WITH CONTRAST  TECHNIQUE: Multidetector CT imaging of the chest, abdomen and pelvis was performed following the standard protocol during bolus administration of intravenous contrast. CONTRAST:  ISOVUE-300 IOPAMIDOL (ISOVUE-300) INJECTION 61% COMPARISON:  02/29/2016 abdominal and pelvic CT. FINDINGS: CT CHEST Mediastinum/Nodes: The heart and great vessels are unremarkable. No enlarged lymph nodes, mediastinal mass or pericardial effusion identified. Lungs/Pleura: Unremarkable. There is no evidence of airspace disease, consolidation, mass, suspicious nodule or endobronchial/endotracheal lesion. No pleural effusions or pneumothorax identified. Musculoskeletal: No acute or suspicious abnormalities. Congenital fusion of T7-T8 noted. CT ABDOMEN AND PELVIS Hepatobiliary: The liver and gallbladder are unremarkable. There is no evidence of biliary dilatation. Pancreas: Unremarkable Spleen: Splenomegaly identified with a splenic volume of 900 cc. No focal splenic lesions are identified. Adrenals/Urinary Tract: The kidneys, adrenal glands and bladder are unremarkable. Stomach/Bowel: Unremarkable. No evidence of bowel obstruction or definite bowel wall thickening. Vascular/Lymphatic: Unremarkable. No abdominal aortic aneurysm or enlarged lymph nodes. Reproductive: Prostate unremarkable Other: No free fluid, abscess or pneumoperitoneum. Musculoskeletal: No acute or suspicious abnormalities identified. IMPRESSION: No acute abnormalities. No findings to suggest a source for this patient's fever or right abdominal pain. Moderate splenomegaly. Electronically Signed   By: Harmon Pier M.D.   On: 03/15/2016 15:59   Ct Abdomen Pelvis  W Contrast  03/15/2016  CLINICAL DATA:  35 year old male with fever of unknown origin, right and abdominal pelvic pain, nausea and GI bleed. EXAM: CT CHEST, ABDOMEN, AND PELVIS WITH CONTRAST TECHNIQUE: Multidetector CT imaging of the chest, abdomen and pelvis was performed following the standard protocol during bolus administration of intravenous contrast. CONTRAST:  ISOVUE-300 IOPAMIDOL (ISOVUE-300) INJECTION 61% COMPARISON:  02/29/2016 abdominal and pelvic CT. FINDINGS: CT CHEST Mediastinum/Nodes: The heart and great vessels are unremarkable. No enlarged lymph nodes, mediastinal mass or pericardial effusion identified. Lungs/Pleura: Unremarkable. There is no evidence of airspace disease, consolidation, mass, suspicious nodule or endobronchial/endotracheal lesion. No pleural effusions or pneumothorax identified. Musculoskeletal: No acute or suspicious abnormalities. Congenital fusion of T7-T8 noted. CT ABDOMEN AND PELVIS Hepatobiliary: The liver and gallbladder are unremarkable. There is no evidence of biliary dilatation. Pancreas: Unremarkable Spleen: Splenomegaly identified with a splenic volume of 900 cc. No focal splenic lesions are identified. Adrenals/Urinary Tract: The kidneys, adrenal glands and bladder are unremarkable. Stomach/Bowel: Unremarkable. No evidence of bowel obstruction or definite bowel wall thickening. Vascular/Lymphatic: Unremarkable. No abdominal aortic aneurysm or enlarged lymph nodes. Reproductive: Prostate unremarkable Other: No free fluid, abscess or pneumoperitoneum. Musculoskeletal: No acute or suspicious abnormalities identified. IMPRESSION: No acute abnormalities. No findings to suggest a source for this patient's fever or right abdominal pain. Moderate splenomegaly. Electronically Signed   By: Harmon Pier M.D.   On: 03/15/2016 15:59      Assessment/Plan:  INTERVAL HISTORY:   03/14/16: still with high fevers 03/15/16: CT scans unrevealing  Principal Problem:   RLQ  abdominal pain Active Problems:   Chronic narcotic dependence (HCC)   Hepatitis   Tobacco abuse   Osteomyelitis of thoracic region [facility])   Rectal bleed   Lower GI bleeding   FUO (fever of unknown origin)    Carl Valencia is a 35 y.o. male with  Hx of IVDU, MRSA bacteremia, thoracic diskitis, septic arthritis of [place] joint admitted with blood tinged loose stools (that resolved subsequently) now with persistent FUO  #1 FUO: CT scans unrevealing. He maintains that he only ever had ONE blood culture rather than the 3 that are documented. I would like to prove he is actually bacteremic rather than assuming so with insufficient  evidence  ---followup blood cultures --followup Resp virus panel --will get additional FUO labs including EBV, CMV serologies, ANA, RF, ANCA, ACE level, cryoglobulins, HIV RNA, Hep C RNA  PERHAPS his fevers are due to ACUTE HCV? His LFTs were up quite a bit earlier this month and HCV ab is positive?  --would consider MRI of the T spine --could consider TEE but am not anxious to try this in this patient         LOS: 6 days   Acey Lav 03/15/2016, 7:04 PM

## 2016-03-16 ENCOUNTER — Inpatient Hospital Stay (HOSPITAL_COMMUNITY): Payer: Self-pay

## 2016-03-16 DIAGNOSIS — F1990 Other psychoactive substance use, unspecified, uncomplicated: Secondary | ICD-10-CM | POA: Diagnosis present

## 2016-03-16 DIAGNOSIS — B182 Chronic viral hepatitis C: Secondary | ICD-10-CM

## 2016-03-16 DIAGNOSIS — F1910 Other psychoactive substance abuse, uncomplicated: Secondary | ICD-10-CM

## 2016-03-16 DIAGNOSIS — R451 Restlessness and agitation: Secondary | ICD-10-CM

## 2016-03-16 LAB — LACTATE DEHYDROGENASE: LDH: 216 U/L — AB (ref 98–192)

## 2016-03-16 LAB — FERRITIN: FERRITIN: 280 ng/mL (ref 24–336)

## 2016-03-16 LAB — CK: CK TOTAL: 28 U/L — AB (ref 49–397)

## 2016-03-16 MED ORDER — HYDROMORPHONE HCL 1 MG/ML IJ SOLN
2.0000 mg | Freq: Once | INTRAMUSCULAR | Status: AC
  Administered 2016-03-16: 2 mg via INTRAVENOUS
  Filled 2016-03-16: qty 2

## 2016-03-16 MED ORDER — MORPHINE SULFATE (PF) 2 MG/ML IV SOLN: 2.0000 mg | Freq: Once | INTRAVENOUS | Status: DC

## 2016-03-16 MED ORDER — VANCOMYCIN HCL IN DEXTROSE 1-5 GM/200ML-% IV SOLN
1000.0000 mg | Freq: Three times a day (TID) | INTRAVENOUS | Status: DC
  Administered 2016-03-16: 1000 mg via INTRAVENOUS
  Filled 2016-03-16 (×3): qty 200

## 2016-03-16 MED ORDER — VANCOMYCIN HCL 10 G IV SOLR
1500.0000 mg | Freq: Once | INTRAVENOUS | Status: AC
  Administered 2016-03-16: 1500 mg via INTRAVENOUS
  Filled 2016-03-16: qty 1500

## 2016-03-16 MED ORDER — IOPAMIDOL (ISOVUE-370) INJECTION 76%
INTRAVENOUS | Status: AC
  Administered 2016-03-16: 100 mL
  Filled 2016-03-16: qty 100

## 2016-03-16 MED ORDER — OXYCODONE HCL 5 MG PO TABS
10.0000 mg | ORAL_TABLET | Freq: Once | ORAL | Status: AC
  Administered 2016-03-16: 10 mg via ORAL
  Filled 2016-03-16: qty 2

## 2016-03-16 NOTE — Progress Notes (Signed)
Pharmacy Antibiotic Note  Carl Valencia is a 35 y.o. male  with bacteremia.  Pharmacy has been consulted for Vancomycin dosing.  Plan: Vancomycin 1500 mg IV now, then 1 g IV q8h  Height: 5\' 9"  (175.3 cm) Weight:  (pt refused stand on wt.scale ) IBW/kg (Calculated) : 70.7  Temp (24hrs), Avg:101.2 F (38.4 C), Min:99.9 F (37.7 C), Max:103 F (39.4 C)   Recent Labs Lab 03/10/16 0246 03/11/16 0932 03/12/16 1150 03/13/16 0541 03/14/16 0628 03/15/16 0626  WBC 5.5 5.8 4.1 5.6 6.6 12.7*  CREATININE 0.76 0.93  --  0.90 0.86 0.98    Estimated Creatinine Clearance: 105.2 mL/min (by C-G formula based on Cr of 0.98).    Allergies  Allergen Reactions  . Cyclobenzaprine Other (See Comments)  . Darvocet [Propoxyphene N-Acetaminophen] Anaphylaxis  . Penicillins Anaphylaxis and Hives    Has patient had a PCN reaction causing immediate rash, facial/tongue/throat swelling, SOB or lightheadedness with hypotension: Yes Has patient had a PCN reaction causing severe rash involving mucus membranes or skin necrosis: No Has patient had a PCN reaction that required hospitalization No Has patient had a PCN reaction occurring within the last 10 years: No If all of the above answers are "NO", then may proceed with Cephalosporin use.   . Sulfa Antibiotics Anaphylaxis  . Toradol [Ketorolac Tromethamine] Anaphylaxis  . Fentanyl Swelling    Throat swelling  . Trazodone And Nefazodone     Restless legs  . Tylenol [Acetaminophen] Hives    Patient has sever liver damage, will and should not take tylenol    Antimicrobials this admission: Vancomycin 4/3 >>    Microbiology results: 3/28 BCx: 1/2 CNS 4/1  BCx: 1/2 GPC clusters   3/28 MRSA PCR: +  Carl Valencia, Carl Valencia 03/16/2016 12:19 AM

## 2016-03-16 NOTE — Care Management Note (Signed)
Case Management Note  Patient Details  Name: Carl Valencia MRN: [id number] Date of Birth: 1981/01/10  Subjective/Objective:                    Action/Plan:  UR updated . Continue to follow for discharge needs  Expected Discharge Date:                  Expected Discharge Plan:     In-House Referral:     Discharge planning Services     Post Acute Care Choice:    Choice offered to:     DME Arranged:    DME Agency:     HH Arranged:    HH Agency:     Status of Service:  In process, will continue to follow  Medicare Important Message Given:    Date Medicare IM Given:    Medicare IM give by:    Date Additional Medicare IM Given:    Additional Medicare Important Message give by:     If discussed at Long Length of Stay Meetings, dates discussed:    Additional Comments:  [name], [name], RN 03/16/2016, 3:46 PM
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
## 2016-03-16 NOTE — Progress Notes (Signed)
Subjective:  C/o  Fevers, left sided pain he is very angry and agitated today  Antibiotics:  Anti-infectives    Start     Dose/Rate Route Frequency Ordered Stop   03/16/16 1200  vancomycin (VANCOCIN) IVPB 1000 mg/200 mL premix  Status:  Discontinued     1,000 mg 200 mL/hr over 60 Minutes Intravenous Every 8 hours 03/16/16 0022 03/16/16 1746   03/16/16 0100  vancomycin (VANCOCIN) 1,500 mg in sodium chloride 0.9 % 500 mL IVPB     1,500 mg 250 mL/hr over 120 Minutes Intravenous  Once 03/16/16 0022 03/16/16 0300   03/11/16 0400  vancomycin (VANCOCIN) 1,250 mg in sodium chloride 0.9 % 250 mL IVPB  Status:  Discontinued     1,250 mg 166.7 mL/hr over 90 Minutes Intravenous Every 8 hours 03/10/16 1701 03/12/16 1346   03/10/16 1700  vancomycin (VANCOCIN) 1,500 mg in sodium chloride 0.9 % 500 mL IVPB     1,500 mg 250 mL/hr over 120 Minutes Intravenous  Once 03/10/16 1648 03/10/16 1922   03/10/16 0815  ciprofloxacin (CIPRO) IVPB 400 mg  Status:  Discontinued     400 mg 200 mL/hr over 60 Minutes Intravenous Every 12 hours 03/10/16 0806 03/12/16 1125   03/10/16 0815  metroNIDAZOLE (FLAGYL) IVPB 500 mg  Status:  Discontinued     500 mg 100 mL/hr over 60 Minutes Intravenous 3 times per day 03/10/16 0806 03/12/16 1125      Medications: Scheduled Meds: . Chlorhexidine Gluconate Cloth  6 each Topical Q0600  . mupirocin ointment   Nasal BID  . nicotine  14 mg Transdermal Daily  . polyethylene glycol  17 g Oral Daily  . senna-docusate  2 tablet Oral BID   Continuous Infusions: . sodium chloride 10 mL/hr at 03/16/16 0958   PRN Meds:.ibuprofen, ondansetron, oxyCODONE, sodium chloride, traZODone    Objective: Weight change:   Intake/Output Summary (Last 24 hours) at 03/16/16 1831 Last data filed at 03/16/16 1615  Gross per 24 hour  Intake    610 ml  Output    400 ml  Net    210 ml   Blood pressure 141/86, pulse 83, temperature 102.9 F (39.4 C), temperature source Oral,  resp. rate 20, height  (1.753 m), weight 169 lb 14.4 oz (77.066 kg), SpO2 100 %. Temp:  [100.6 F (38.1 C)-103 F (39.4 C)] 102.9 F (39.4 C) (04/03 1615) Pulse Rate:  [83] 83 (04/02 2114) Resp:  [20] 20 (04/02 2114) BP: (141)/(86) 141/86 mmHg (04/02 2114) SpO2:  [100 %] 100 % (04/02 2114)  Physical Exam: General: Alert and awake, oriented x3,agitated angry HEENT: anicteric sclera,EOMI CVS regular rate, normal r,  no murmur rubs or gallops Chest: , no wheezing, resp distress Abdomen: soft nontender, nondistended Extremities: no  clubbing or edema noted bilaterally Skin: no rashes Neuro: nonfocal  CBC:  CBC Latest Ref Rng 03/15/2016 03/14/2016 03/13/2016  WBC 4.0 - 10.5 K/uL 12.7(H) 6.6 5.6  Hemoglobin 13.0 - 17.0 g/dL 16.1 09.6 04.5  Hematocrit 39.0 - 52.0 % 39.7 40.0 37.5(L)  Platelets 150 - 400 K/uL 145(L) 112(L) 101(L)      BMET  Recent Labs  03/14/16 0628 03/15/16 0626  NA 135 134*  K 3.7 3.7  CL 105 101  CO2 21* 20*  GLUCOSE 85 126*  BUN 7 <5*  CREATININE 0.86 0.98  CALCIUM 8.5* 8.5*     Liver Panel   Recent Labs  03/15/16 0626  PROT 6.5  ALBUMIN 3.2*  AST 22  ALT 23  ALKPHOS 64  BILITOT 1.9*       Sedimentation Rate  Recent Labs  03/15/16 0626  ESRSEDRATE 30*   C-Reactive Protein  Recent Labs  03/15/16 0626  CRP 10.0*    Micro Results: Recent Results (from the past 720 hour(s))  Urine culture     Status: None   Collection Time: 03/09/16  4:59 PM  Result Value Ref Range Status   Specimen Description URINE, CLEAN CATCH  Final   Special Requests NONE  Final   Culture 3,000 COLONIES/mL INSIGNIFICANT GROWTH  Final   Report Status 03/11/2016 FINAL  Final  Culture, blood (routine x 2)     Status: None   Collection Time: 03/10/16  4:12 AM  Result Value Ref Range Status   Specimen Description BLOOD RIGHT HAND  Final   Special Requests BOTTLES DRAWN AEROBIC ONLY 5CC  Final   Culture NO GROWTH 5 DAYS  Final   Report Status  03/15/2016 FINAL  Final  Culture, blood (routine x 2)     Status: None   Collection Time: 03/10/16 11:55 AM  Result Value Ref Range Status   Specimen Description BLOOD RIGHT ARM  Final   Special Requests IN PEDIATRIC BOTTLE 1.5CC  Final   Culture  Setup Time   Final    GRAM POSITIVE COCCI IN CLUSTERS IN PEDIATRIC BOTTLE CRITICAL RESULT CALLED TO, READ BACK BY AND VERIFIED WITH: C. THOMAS,RN AT 0848 ON [id number] BY Lucienne Capers    Culture   Final    STAPHYLOCOCCUS SPECIES (COAGULASE NEGATIVE) THE SIGNIFICANCE OF ISOLATING THIS ORGANISM FROM A SINGLE SET OF BLOOD CULTURES WHEN MULTIPLE SETS ARE DRAWN IS UNCERTAIN. PLEASE NOTIFY THE MICROBIOLOGY DEPARTMENT WITHIN ONE WEEK IF SPECIATION AND SENSITIVITIES ARE REQUIRED.    Report Status 03/13/2016 FINAL  Final  Culture, blood (routine x 2)     Status: None   Collection Time: 03/10/16 11:59 AM  Result Value Ref Range Status   Specimen Description BLOOD RIGHT ARM  Final   Special Requests IN PEDIATRIC BOTTLE 1.5CC  Final   Culture NO GROWTH 5 DAYS  Final   Report Status 03/15/2016 FINAL  Final  MRSA PCR Screening     Status: Abnormal   Collection Time: 03/10/16 12:36 PM  Result Value Ref Range Status   MRSA by PCR POSITIVE (A) NEGATIVE Final    Comment:        The GeneXpert MRSA Assay (FDA approved for NASAL specimens only), is one component of a comprehensive MRSA colonization surveillance program. It is not intended to diagnose MRSA infection nor to guide or monitor treatment for MRSA infections. RESULT CALLED TO, READ BACK BY AND VERIFIED WITH: L MOSS RN [id number] 03/10/16 A BROWNING   Culture, blood (Routine X 2) w Reflex to ID Panel     Status: None (Preliminary result)   Collection Time: 03/14/16  4:24 PM  Result Value Ref Range Status   Specimen Description BLOOD LEFT HAND  Final   Special Requests BOTTLES DRAWN AEROBIC ONLY 4CC  Final   Culture NO GROWTH 2 DAYS  Final   Report Status PENDING  Incomplete  Culture, blood (Routine  X 2) w Reflex to ID Panel     Status: None (Preliminary result)   Collection Time: 03/14/16  4:27 PM  Result Value Ref Range Status   Specimen Description BLOOD RIGHT HAND  Final   Special Requests BOTTLES DRAWN AEROBIC ONLY 5CC  Final   Culture  Setup Time   Final    AEROBIC BOTTLE ONLY GRAM POSITIVE COCCI IN CLUSTERS CRITICAL RESULT CALLED TO, READ BACK BY AND VERIFIED WITH: J.MCBRIDE,RN 0007 03/16/16 M.CAMPBELL    Culture   Final    GRAM POSITIVE COCCI CULTURE REINCUBATED FOR BETTER GROWTH    Report Status PENDING  Incomplete    Studies/Results: Ct Chest W Contrast  03/15/2016  CLINICAL DATA:  35 year old male with fever of unknown origin, right and abdominal pelvic pain, nausea and GI bleed. EXAM: CT CHEST, ABDOMEN, AND PELVIS WITH CONTRAST TECHNIQUE: Multidetector CT imaging of the chest, abdomen and pelvis was performed following the standard protocol during bolus administration of intravenous contrast. CONTRAST:  ISOVUE-300 IOPAMIDOL (ISOVUE-300) INJECTION 61% COMPARISON:  02/29/2016 abdominal and pelvic CT. FINDINGS: CT CHEST Mediastinum/Nodes: The heart and great vessels are unremarkable. No enlarged lymph nodes, mediastinal mass or pericardial effusion identified. Lungs/Pleura: Unremarkable. There is no evidence of airspace disease, consolidation, mass, suspicious nodule or endobronchial/endotracheal lesion. No pleural effusions or pneumothorax identified. Musculoskeletal: No acute or suspicious abnormalities. Congenital fusion of T7-T8 noted. CT ABDOMEN AND PELVIS Hepatobiliary: The liver and gallbladder are unremarkable. There is no evidence of biliary dilatation. Pancreas: Unremarkable Spleen: Splenomegaly identified with a splenic volume of 900 cc. No focal splenic lesions are identified. Adrenals/Urinary Tract: The kidneys, adrenal glands and bladder are unremarkable. Stomach/Bowel: Unremarkable. No evidence of bowel obstruction or definite bowel wall thickening.  Vascular/Lymphatic: Unremarkable. No abdominal aortic aneurysm or enlarged lymph nodes. Reproductive: Prostate unremarkable Other: No free fluid, abscess or pneumoperitoneum. Musculoskeletal: No acute or suspicious abnormalities identified. IMPRESSION: No acute abnormalities. No findings to suggest a source for this patient's fever or right abdominal pain. Moderate splenomegaly. Electronically Signed   By: Harmon Pier M.D.   On: 03/15/2016 15:59   Ct Abdomen Pelvis W Contrast  03/15/2016  CLINICAL DATA:  35 year old male with fever of unknown origin, right and abdominal pelvic pain, nausea and GI bleed. EXAM: CT CHEST, ABDOMEN, AND PELVIS WITH CONTRAST TECHNIQUE: Multidetector CT imaging of the chest, abdomen and pelvis was performed following the standard protocol during bolus administration of intravenous contrast. CONTRAST:  ISOVUE-300 IOPAMIDOL (ISOVUE-300) INJECTION 61% COMPARISON:  02/29/2016 abdominal and pelvic CT. FINDINGS: CT CHEST Mediastinum/Nodes: The heart and great vessels are unremarkable. No enlarged lymph nodes, mediastinal mass or pericardial effusion identified. Lungs/Pleura: Unremarkable. There is no evidence of airspace disease, consolidation, mass, suspicious nodule or endobronchial/endotracheal lesion. No pleural effusions or pneumothorax identified. Musculoskeletal: No acute or suspicious abnormalities. Congenital fusion of T7-T8 noted. CT ABDOMEN AND PELVIS Hepatobiliary: The liver and gallbladder are unremarkable. There is no evidence of biliary dilatation. Pancreas: Unremarkable Spleen: Splenomegaly identified with a splenic volume of 900 cc. No focal splenic lesions are identified. Adrenals/Urinary Tract: The kidneys, adrenal glands and bladder are unremarkable. Stomach/Bowel: Unremarkable. No evidence of bowel obstruction or definite bowel wall thickening. Vascular/Lymphatic: Unremarkable. No abdominal aortic aneurysm or enlarged lymph nodes. Reproductive: Prostate unremarkable  Other: No free fluid, abscess or pneumoperitoneum. Musculoskeletal: No acute or suspicious abnormalities identified. IMPRESSION: No acute abnormalities. No findings to suggest a source for this patient's fever or right abdominal pain. Moderate splenomegaly. Electronically Signed   By: Harmon Pier M.D.   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 male with  Hx of IVDU, MRSA bacteremia, thoracic diskitis, septic arthritis of [place] joint admitted with blood tinged loose stools (that resolved subsequently) now with persistent FUO  #1 FUO: CT scans unrevealing.   --dc IV abx ---followup blood cultures --followup Resp virus panel --check MRI of the Cervical and thoracic spine --followup further FUO labs   #2 ; Hepatitis C  without hepatic coma: [facility] confirmed he previously had hepatitis C  #4 IVDU: denies this but clearly a problem  #5 Belligerence: I can shade his frustration at his protracted stay and our inability so far to find a cause for his fevers. He clearly does have underlying psychopathology that will need to be treated.       LOS: 7 days   Carl Valencia 03/16/2016, 6:31 PM

## 2016-03-16 NOTE — Progress Notes (Signed)
This nurse informed pt that he is going down for MRI and he stated that he can not do it tonight bec his IV site is painful from previous CT scan procedure bec. They have injected contrast and he said he is hurting so bad he cant do MRI tonight , called MD to get a breakthrough pain med since he had oxycodone 15 mg at 8:00pm

## 2016-03-17 ENCOUNTER — Inpatient Hospital Stay (HOSPITAL_COMMUNITY): Payer: Self-pay

## 2016-03-17 ENCOUNTER — Encounter (HOSPITAL_COMMUNITY): Payer: Self-pay | Admitting: Infectious Disease

## 2016-03-17 DIAGNOSIS — R509 Fever, unspecified: Secondary | ICD-10-CM | POA: Insufficient documentation

## 2016-03-17 DIAGNOSIS — M79609 Pain in unspecified limb: Secondary | ICD-10-CM

## 2016-03-17 DIAGNOSIS — Z765 Malingerer [conscious simulation]: Secondary | ICD-10-CM

## 2016-03-17 DIAGNOSIS — R001 Bradycardia, unspecified: Secondary | ICD-10-CM | POA: Insufficient documentation

## 2016-03-17 DIAGNOSIS — M7989 Other specified soft tissue disorders: Secondary | ICD-10-CM

## 2016-03-17 DIAGNOSIS — I82629 Acute embolism and thrombosis of deep veins of unspecified upper extremity: Secondary | ICD-10-CM | POA: Diagnosis present

## 2016-03-17 HISTORY — DX: Malingerer (conscious simulation): Z76.5

## 2016-03-17 LAB — PROTEIN ELECTROPHORESIS, SERUM
A/G Ratio: 0.8 (ref 0.7–1.7)
ALPHA-1-GLOBULIN: 0.5 g/dL — AB (ref 0.0–0.4)
ALPHA-2-GLOBULIN: 0.8 g/dL (ref 0.4–1.0)
Albumin ELP: 3 g/dL (ref 2.9–4.4)
BETA GLOBULIN: 0.9 g/dL (ref 0.7–1.3)
GAMMA GLOBULIN: 1.5 g/dL (ref 0.4–1.8)
GLOBULIN, TOTAL: 3.8 g/dL (ref 2.2–3.9)
TOTAL PROTEIN ELP: 6.8 g/dL (ref 6.0–8.5)

## 2016-03-17 LAB — BASIC METABOLIC PANEL
Anion gap: 12 (ref 5–15)
CALCIUM: 8.8 mg/dL — AB (ref 8.9–10.3)
CHLORIDE: 103 mmol/L (ref 101–111)
CO2: 24 mmol/L (ref 22–32)
CREATININE: 0.73 mg/dL (ref 0.61–1.24)
GFR calc Af Amer: >60 mL/min (ref >60)
GFR calc non Af Amer: >60 mL/min (ref >60)
GLUCOSE: 107 mg/dL — AB (ref 65–99)
Potassium: 4.1 mmol/L (ref 3.5–5.1)
Sodium: 139 mmol/L (ref 135–145)

## 2016-03-17 LAB — CBC
HEMATOCRIT: 37.3 % — AB (ref 39.0–52.0)
Hemoglobin: 12.6 g/dL — ABNORMAL LOW (ref 13.0–17.0)
MCH: 29.4 pg (ref 26.0–34.0)
MCHC: 33.8 g/dL (ref 30.0–36.0)
MCV: 87.1 fL (ref 78.0–100.0)
PLATELETS: 265 10*3/uL (ref 150–400)
RBC: 4.28 MIL/uL (ref 4.22–5.81)
RDW: 12.5 % (ref 11.5–15.5)
WBC: 8.7 10*3/uL (ref 4.0–10.5)

## 2016-03-17 LAB — EPSTEIN-BARR VIRUS VCA ANTIBODY PANEL
EBV EARLY ANTIGEN AB, IGG: 33.5 U/mL — AB (ref 0.0–8.9)
EBV NA IgG: 199 U/mL — ABNORMAL HIGH (ref 0.0–17.9)
EBV VCA IgM: <36 U/mL (ref 0.0–35.9)

## 2016-03-17 LAB — RESPIRATORY VIRUS PANEL
Adenovirus: NEGATIVE
INFLUENZA B 1: NEGATIVE
Influenza A: NEGATIVE
METAPNEUMOVIRUS: NEGATIVE
PARAINFLUENZA 2 A: NEGATIVE
PARAINFLUENZA 3 A: NEGATIVE
Parainfluenza 1: NEGATIVE
RESPIRATORY SYNCYTIAL VIRUS A: NEGATIVE
Respiratory Syncytial Virus B: NEGATIVE
Rhinovirus: NEGATIVE

## 2016-03-17 LAB — ANGIOTENSIN CONVERTING ENZYME: Angiotensin-Converting Enzyme: 69 U/L (ref 14–82)

## 2016-03-17 LAB — HIV-1 RNA ULTRAQUANT REFLEX TO GENTYP+: HIV-1 RNA Quant, Log: UNDETERMINED log10copy/mL

## 2016-03-17 LAB — MPO/PR-3 (ANCA) ANTIBODIES

## 2016-03-17 LAB — ANTINUCLEAR ANTIBODIES, IFA: ANTINUCLEAR ANTIBODIES, IFA: NEGATIVE

## 2016-03-17 LAB — RHEUMATOID FACTOR: RHEUMATOID FACTOR: 13 [IU]/mL (ref 0.0–13.9)

## 2016-03-17 LAB — RPR: RPR: NONREACTIVE

## 2016-03-17 LAB — HCV RNA QUANT: HCV Quantitative: NOT DETECTED IU/mL (ref >50)

## 2016-03-17 LAB — CMV ANTIBODY, IGG (EIA): CMV Ab - IgG: 5.9 U/mL — ABNORMAL HIGH (ref 0.00–0.59)

## 2016-03-17 LAB — CMV IGM: CMV IgM: <30 AU/mL (ref 0.0–29.9)

## 2016-03-17 MED ORDER — OXYCODONE HCL 5 MG PO TABS
15.0000 mg | ORAL_TABLET | Freq: Once | ORAL | Status: AC
  Administered 2016-03-17: 15 mg via ORAL
  Filled 2016-03-17: qty 3

## 2016-03-17 MED ORDER — LORAZEPAM 2 MG/ML IJ SOLN
INTRAMUSCULAR | Status: AC
  Filled 2016-03-17: qty 1

## 2016-03-17 MED ORDER — APIXABAN 5 MG PO TABS: 5.0000 mg | ORAL_TABLET | Freq: Two times a day (BID) | ORAL | Status: DC

## 2016-03-17 MED ORDER — ENOXAPARIN SODIUM 80 MG/0.8ML ~~LOC~~ SOLN
80.0000 mg | Freq: Two times a day (BID) | SUBCUTANEOUS | Status: DC
  Administered 2016-03-17 – 2016-03-19 (×4): 80 mg via SUBCUTANEOUS
  Filled 2016-03-17 (×4): qty 0.8

## 2016-03-17 MED ORDER — HEPARIN (PORCINE) IN NACL 100-0.45 UNIT/ML-% IJ SOLN
1150.0000 [IU]/h | INTRAMUSCULAR | Status: DC
  Filled 2016-03-17 (×2): qty 250

## 2016-03-17 MED ORDER — APIXABAN 5 MG PO TABS: 10.0000 mg | ORAL_TABLET | Freq: Two times a day (BID) | ORAL | Status: DC

## 2016-03-17 MED ORDER — HEPARIN BOLUS VIA INFUSION
4000.0000 [IU] | Freq: Once | INTRAVENOUS | Status: DC
  Filled 2016-03-17: qty 4000

## 2016-03-17 MED ORDER — OXYCODONE HCL 5 MG PO TABS: 15.0000 mg | ORAL_TABLET | Freq: Once | ORAL | Status: DC

## 2016-03-17 MED ORDER — LORAZEPAM 2 MG/ML IJ SOLN
1.0000 mg | Freq: Once | INTRAMUSCULAR | Status: AC
Start: 2016-03-17 — End: 2016-03-17
  Administered 2016-03-17 (×2): 1 mg via INTRAVENOUS

## 2016-03-17 NOTE — Progress Notes (Signed)
Pt refusing VS

## 2016-03-17 NOTE — Progress Notes (Signed)
MD notified of Right Upper Ext. Venous Duplex results.
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
## 2016-03-17 NOTE — Care Management Note (Signed)
Case Management Note  Patient Details  Name: Linard MillersRaymond Warwick MRN: [id number] Date of Birth: 12/30/1980  Subjective/Objective:                    Action/Plan: Patient being started on Eliquis . Patient having MRI at present spoke with patient's girlfriend at bedside . CM can provide a 30 day  free card for Eliquis . Printed medication assistance application and started completing . Of course patient needs PCP . Confirmed he does not have one . [facility] . Patient HAS to call himself, CM attempted to make follow up appointment , however, Clinic requires to speak to patient directly. Will follow up with patient after he returns from MRI.   Expected Discharge Date:                  Expected Discharge Plan:     In-House Referral:     Discharge planning Services     Post Acute Care Choice:    Choice offered to:     DME Arranged:    DME Agency:     HH Arranged:    HH Agency:     Status of Service:  In process, will continue to follow  Medicare Important Message Given:    Date Medicare IM Given:    Medicare IM give by:    Date Additional Medicare IM Given:    Additional Medicare Important Message give by:     If discussed at Long Length of Stay Meetings, dates discussed:    Additional Comments:  [name], Hersel Mcmeen Marie, RN 03/17/2016, 11:45 AM

## 2016-03-17 NOTE — Progress Notes (Signed)
Subjective:  Patient is asleep post MRI  Antibiotics:  Anti-infectives    Start     Dose/Rate Route Frequency Ordered Stop   03/16/16 1200  vancomycin (VANCOCIN) IVPB 1000 mg/200 mL premix  Status:  Discontinued     1,000 mg 200 mL/hr over 60 Minutes Intravenous Every 8 hours 03/16/16 0022 03/16/16 1746   03/16/16 0100  vancomycin (VANCOCIN) 1,500 mg in sodium chloride 0.9 % 500 mL IVPB     1,500 mg 250 mL/hr over 120 Minutes Intravenous  Once 03/16/16 0022 03/16/16 0300   03/11/16 0400  vancomycin (VANCOCIN) 1,250 mg in sodium chloride 0.9 % 250 mL IVPB  Status:  Discontinued     1,250 mg 166.7 mL/hr over 90 Minutes Intravenous Every 8 hours 03/10/16 1701 03/12/16 1346   03/10/16 1700  vancomycin (VANCOCIN) 1,500 mg in sodium chloride 0.9 % 500 mL IVPB     1,500 mg 250 mL/hr over 120 Minutes Intravenous  Once 03/10/16 1648 03/10/16 1922   03/10/16 0815  ciprofloxacin (CIPRO) IVPB 400 mg  Status:  Discontinued     400 mg 200 mL/hr over 60 Minutes Intravenous Every 12 hours 03/10/16 0806 03/12/16 1125   03/10/16 0815  metroNIDAZOLE (FLAGYL) IVPB 500 mg  Status:  Discontinued     500 mg 100 mL/hr over 60 Minutes Intravenous 3 times per day 03/10/16 0806 03/12/16 1125      Medications: Scheduled Meds: . enoxaparin (LOVENOX) injection  80 mg Subcutaneous Q12H  . nicotine  14 mg Transdermal Daily  . polyethylene glycol  17 g Oral Daily  . senna-docusate  2 tablet Oral BID   Continuous Infusions: . sodium chloride 10 mL/hr at 03/16/16 0958   PRN Meds:.ibuprofen, ondansetron, oxyCODONE, sodium chloride, traZODone    Objective: Weight change:   Intake/Output Summary (Last 24 hours) at 03/17/16 2128 Last data filed at 03/17/16 0541  Gross per 24 hour  Intake      0 ml  Output   1250 ml  Net  -1250 ml   Blood pressure 93/67, pulse 94, temperature 98.2 F (36.8 C), temperature source Oral, resp. rate 18, height 5\' 9"  (1.753 m), weight 169 lb 14.4 oz (77.066  kg), SpO2 99 %. Temp:  [98.2 F (36.8 C)-101.8 F (38.8 C)] 98.2 F (36.8 C) (04/04 2027) Pulse Rate:  [94-98] 94 (04/04 2027) Resp:  [18] 18 (04/04 2027) BP: (93-104)/(67-68) 93/67 mmHg (04/04 2027) SpO2:  [99 %-100 %] 99 % (04/04 2027)  Physical Exam: General: asleep from MRI Neuro: nonfocal  CBC:  CBC Latest Ref Rng 03/17/2016 03/15/2016 03/14/2016  WBC 4.0 - 10.5 K/uL 8.7 12.7(H) 6.6  Hemoglobin 13.0 - 17.0 g/dL 12.6(L) 13.6 13.8  Hematocrit 39.0 - 52.0 % 37.3(L) 39.7 40.0  Platelets 150 - 400 K/uL 265 145(L) 112(L)      BMET  Recent Labs  03/15/16 0626 03/17/16 0621  NA 134* 139  K 3.7 4.1  CL 101 103  CO2 20* 24  GLUCOSE 126* 107*  BUN <5* <5*  CREATININE 0.98 0.73  CALCIUM 8.5* 8.8*     Liver Panel   Recent Labs  03/15/16 0626  PROT 6.5  ALBUMIN 3.2*  AST 22  ALT 23  ALKPHOS 64  BILITOT 1.9*       Sedimentation Rate  Recent Labs  03/15/16 0626  ESRSEDRATE 30*   C-Reactive Protein  Recent Labs  03/15/16 0626  CRP 10.0*    Micro Results: Recent Results (from the  past 720 hour(s))  Urine culture     Status: None   Collection Time: 03/09/16  4:59 PM  Result Value Ref Range Status   Specimen Description URINE, CLEAN CATCH  Final   Special Requests NONE  Final   Culture 3,000 COLONIES/mL INSIGNIFICANT GROWTH  Final   Report Status 03/11/2016 FINAL  Final  Culture, blood (routine x 2)     Status: None   Collection Time: 03/10/16  4:12 AM  Result Value Ref Range Status   Specimen Description BLOOD RIGHT HAND  Final   Special Requests BOTTLES DRAWN AEROBIC ONLY 5CC  Final   Culture NO GROWTH 5 DAYS  Final   Report Status 03/15/2016 FINAL  Final  Culture, blood (routine x 2)     Status: None   Collection Time: 03/10/16 11:55 AM  Result Value Ref Range Status   Specimen Description BLOOD RIGHT ARM  Final   Special Requests IN PEDIATRIC BOTTLE 1.5CC  Final   Culture  Setup Time   Final    GRAM POSITIVE COCCI IN CLUSTERS IN  PEDIATRIC BOTTLE CRITICAL RESULT CALLED TO, READ BACK BY AND VERIFIED WITH: C. THOMAS,RN AT 0848 ON [id number] BY Lucienne Capers    Culture   Final    STAPHYLOCOCCUS SPECIES (COAGULASE NEGATIVE) THE SIGNIFICANCE OF ISOLATING THIS ORGANISM FROM A SINGLE SET OF BLOOD CULTURES WHEN MULTIPLE SETS ARE DRAWN IS UNCERTAIN. PLEASE NOTIFY THE MICROBIOLOGY DEPARTMENT WITHIN ONE WEEK IF SPECIATION AND SENSITIVITIES ARE REQUIRED.    Report Status 03/13/2016 FINAL  Final  Culture, blood (routine x 2)     Status: None   Collection Time: 03/10/16 11:59 AM  Result Value Ref Range Status   Specimen Description BLOOD RIGHT ARM  Final   Special Requests IN PEDIATRIC BOTTLE 1.5CC  Final   Culture NO GROWTH 5 DAYS  Final   Report Status 03/15/2016 FINAL  Final  MRSA PCR Screening     Status: Abnormal   Collection Time: 03/10/16 12:36 PM  Result Value Ref Range Status   MRSA by PCR POSITIVE (A) NEGATIVE Final    Comment:        The GeneXpert MRSA Assay (FDA approved for NASAL specimens only), is one component of a comprehensive MRSA colonization surveillance program. It is not intended to diagnose MRSA infection nor to guide or monitor treatment for MRSA infections. RESULT CALLED TO, READ BACK BY AND VERIFIED WITH: L MOSS RN 1523 03/10/16 A BROWNING   Culture, blood (Routine X 2) w Reflex to ID Panel     Status: None (Preliminary result)   Collection Time: 03/14/16  4:24 PM  Result Value Ref Range Status   Specimen Description BLOOD LEFT HAND  Final   Special Requests BOTTLES DRAWN AEROBIC ONLY 4CC  Final   Culture NO GROWTH 3 DAYS  Final   Report Status PENDING  Incomplete  Culture, blood (Routine X 2) w Reflex to ID Panel     Status: None (Preliminary result)   Collection Time: 03/14/16  4:27 PM  Result Value Ref Range Status   Specimen Description BLOOD RIGHT HAND  Final   Special Requests BOTTLES DRAWN AEROBIC ONLY 5CC  Final   Culture  Setup Time   Final    AEROBIC BOTTLE ONLY GRAM POSITIVE  COCCI IN CLUSTERS CRITICAL RESULT CALLED TO, READ BACK BY AND VERIFIED WITH: J.MCBRIDE,RN 0007 03/16/16 M.CAMPBELL    Culture   Final    STAPHYLOCOCCUS SPECIES (COAGULASE NEGATIVE) SUSCEPTIBILITIES TO FOLLOW    Report Status  PENDING  Incomplete  Respiratory virus panel     Status: None   Collection Time: 03/14/16  8:42 PM  Result Value Ref Range Status   Source - [facility]AN NASAL SWAB  Corrected   Respiratory Syncytial Virus A Negative Negative Final   Respiratory Syncytial Virus B Negative Negative Final   Influenza A Negative Negative Final   Influenza B Negative Negative Final   Parainfluenza 1 Negative Negative Final   Parainfluenza 2 Negative Negative Final   Parainfluenza 3 Negative Negative Final   Metapneumovirus Negative Negative Final   Rhinovirus Negative Negative Final   Adenovirus Negative Negative Final    Comment: (NOTE) Performed At: [facility] [address] [place], [place] [id number] Mila HomerHancock William F MD WU:[id number]:[phone]     Studies/Results: Mr Cervical Spine Wo Contrast  03/17/2016  CLINICAL DATA:  Fever of unknown origin. IV drug abuser. History of osteomyelitis of the thoracic spine. Neck pain and back pain. EXAM: MRI CERVICAL SPINE WITHOUT CONTRAST; MRI THORACIC SPINE WITHOUT CONTRAST TECHNIQUE: Multiplanar and multiecho pulse sequences of the cervical spine, to include the craniocervical junction and cervicothoracic junction, were obtained according to standard protocol without intravenous contrast.; Multiplanar and multiecho pulse sequences of the thoracic spine were obtained without intravenous contrast. CONTRAST:  None. The study was ordered without contrast but the patient could not cooperate for administration of such. COMPARISON:  CT chest abdomen and pelvis 03/15/2016. MRI of thoracic spine 05/14/2013. FINDINGS: CERVICAL: Alignment: Reversal of the normal cervical lordotic curve. No subluxation. Vertebrae: No worrisome osseous lesions.  No signs of  osteomyelitis. Cord: No cord compression or abnormal cord signal. No intraspinal mass lesion. Posterior Fossa: No tonsillar herniation. Vertebral Arteries: BILATERAL patent. Paraspinal tissues: Unremarkable. Disc levels: The individual disc spaces were examined as follows: C2-3:  Normal. C3-4:  Normal. C4-5:  Mild bulge.  No impingement. C5-6:  Mild bulge.  No impingement. C6-7:  Normal. C7-T1:  Normal. THORACIC: Sequelae of previous T7-8 osteomyelitis, with obliteration of the disc space and fusion across the endplates. No features elsewhere suggestive of discitis or osteomyelitis. No residual bone marrow edema. No spinal stenosis or intraspinal mass. Normal cord size and signal throughout. Termination of the conus at approximately L1. Within limits for detection due to patient motion, unremarkable paravertebral soft tissues. Splenomegaly. IMPRESSION: CERVICAL: No disc protrusion or spinal stenosis. No discitis or osteomyelitis. THORACIC: No disc protrusion or spinal stenosis. Sequelae of healed discitis and osteomyelitis at T7-8. No discitis or osteomyelitis. Electronically Signed   By: Elsie StainJohn T Curnes M.D.   On: 03/17/2016 14:11   Mr Thoracic Spine Wo Contrast  03/17/2016  CLINICAL DATA:  Fever of unknown origin. IV drug abuser. History of osteomyelitis of the thoracic spine. Neck pain and back pain. EXAM: MRI CERVICAL SPINE WITHOUT CONTRAST; MRI THORACIC SPINE WITHOUT CONTRAST TECHNIQUE: Multiplanar and multiecho pulse sequences of the cervical spine, to include the craniocervical junction and cervicothoracic junction, were obtained according to standard protocol without intravenous contrast.; Multiplanar and multiecho pulse sequences of the thoracic spine were obtained without intravenous contrast. CONTRAST:  None. The study was ordered without contrast but the patient could not cooperate for administration of such. COMPARISON:  CT chest abdomen and pelvis 03/15/2016. MRI of thoracic spine 05/14/2013. FINDINGS:  CERVICAL: Alignment: Reversal of the normal cervical lordotic curve. No subluxation. Vertebrae: No worrisome osseous lesions.  No signs of osteomyelitis. Cord: No cord compression or abnormal cord signal. No intraspinal mass lesion. Posterior Fossa: No tonsillar herniation. Vertebral Arteries: BILATERAL patent. Paraspinal tissues: Unremarkable.  Disc levels: The individual disc spaces were examined as follows: C2-3:  Normal. C3-4:  Normal. C4-5:  Mild bulge.  No impingement. C5-6:  Mild bulge.  No impingement. C6-7:  Normal. C7-T1:  Normal. THORACIC: Sequelae of previous T7-8 osteomyelitis, with obliteration of the disc space and fusion across the endplates. No features elsewhere suggestive of discitis or osteomyelitis. No residual bone marrow edema. No spinal stenosis or intraspinal mass. Normal cord size and signal throughout. Termination of the conus at approximately L1. Within limits for detection due to patient motion, unremarkable paravertebral soft tissues. Splenomegaly. IMPRESSION: CERVICAL: No disc protrusion or spinal stenosis. No discitis or osteomyelitis. THORACIC: No disc protrusion or spinal stenosis. Sequelae of healed discitis and osteomyelitis at T7-8. No discitis or osteomyelitis. Electronically Signed   By: Elsie Stain M.D.   On: 03/17/2016 14:11   Ct Extrem Up Entire Arm R W/cm  03/16/2016  CLINICAL DATA:  Pain swelling of the right arm.  Warm to the touch. EXAM: CT OF THE UPPER RIGHT ARM WITH CONTRAST TECHNIQUE: Multidetector CT imaging was performed according to the standard protocol. Multiplanar CT image reconstructions were also generated. CONTRAST:  100 mL Omnipaque 350 COMPARISON:  None. FINDINGS: No acute fracture or dislocation. No aggressive lytic or sclerotic osseous lesion. No periosteal reaction or bone destruction. Normal glenohumeral joint. Normal elbow joint. Soft tissue edema along the volar aspect of the right forearm extending from the elbow to the wrist. Soft tissue edema  within the pronator teres and brachia radialis muscles. No drainable fluid collection. No hematoma. No soft tissue emphysema or radiopaque foreign body. Visualized right lung is clear. IMPRESSION: 1. Soft tissue edema along the volar aspect of the right forearm extending from the elbow to the wrist most concerning for cellulitis. Soft tissue edema involving the brachioradialis and pronator teres muscles concerning for myositis. No drainable abscess. Electronically Signed   By: Elige Ko   On: 03/16/2016 21:38      Assessment/Plan:  INTERVAL HISTORY:   03/14/16: still with high fevers 03/15/16: CT scans unrevealing 03/16/16: 1/2 [facility] but likely Coag Neg staph again 03/17/16: MRI of T and C spine negative  ASTUTE review of chart by Dr. Vanessa Barbara revealed pt with mx microbes in blood and there is also history in [facility] record [facility] of the patient being suspected of malingering and intentionally manipulating PICC lines, IVs for self administer narcotics as well as likely to auto-inoculate himself with bacteria and in case at [facility] mycobacteria and different candida species  Principal Problem:   RLQ abdominal pain Active Problems:   Chronic narcotic dependence (HCC)   Hepatitis   Tobacco abuse   Osteomyelitis of thoracic region [facility])   Rectal bleed   Lower GI bleeding   FUO (fever of unknown origin)   RUQ pain   IVDU (intravenous drug user)   Arm DVT (deep venous thromboembolism), acute (HCC)    Carl Valencia is a 35 y.o. male with  Hx of IVDU, MRSA bacteremia, thoracic diskitis, septic arthritis of [place] joint admitted with blood tinged loose stools (that resolved subsequently) now with persistent FUO.    #1 FUO: CT scans unrevealing. MRI scans unrevealing. All blood work unrevealing.   NOTE AS DR. 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 However they had suspicion he was manipulating his IV access for narcotic use and potentially intentionally inoculating himself with pathogens. He then grew THREE different candida species along with M abscessus (a typically highly virulent organism) in a patient with clinically stable it was felt that most of these species likely represented contaminants. He was treated for fungemia with fluconazole after his power picc was pulled and he had formal optho exam  During that hospitalization he also had repeatedly fevers despite negative blood cultures apart from the ones mentioned. He was also suspected of crushing narcotics given to him and administering these into his central line (see [facility] DC summary)   I think there is sufficient evidence from the chart to suspect MALINGERING and likely attempts to inoculate himself with bacteria during his hospitalization, potentially manipulate temperature  --I WOULD DC HIS IV ACCESS COMPLETELY and give him LOVENOX for DVT and then oral agent  --I WOULD ONLY TREAT HIS DVT WHICH COULD BE ONE REASON FOR FEVER (HE MAY ALSO HAVE CAUSED THIS BY MANIPULATION AS WELL)   #2 ; Hepatitis C  without hepatic coma: Labs from [facility] confirmed he previously had hepatitis C: he will never be treated until pshchopathology is stablized  #4 IVDU: no question of this being a problem  #5 Malingering: Looking through just a few of the DC summaries from [facility] there is record of him going to Burn unit for electrocution but then physical exam not matching the claimed mechanism or injury, there are records at [facility] as well where he was thought to be manipulating IV as he was at  [facility]  Would consult psychiatry and would ask if there is a unit with 24 hour video surveillance where he could be monitored here or at another institution?       LOS: 8 days   Acey Lav 03/17/2016, 9:28 PM

## 2016-03-17 NOTE — Progress Notes (Signed)
Preliminary results by tech - Right Upper Ext. Venous Duplex Completed. Positive for deep vein thrombosis involving the brachial veins extending in the proximal radial veins. Also, superficial vein thrombosis involving the cephalic vein at the ante cubical level extending into the upper arm.  Results given to patient's nurse. Marilynne Halstedita Mayerli Kirst, BS, RDMS, RVT

## 2016-03-17 NOTE — Progress Notes (Signed)
Pt refusing MRI r/t "claustrophobia".
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
## 2016-03-18 DIAGNOSIS — F1990 Other psychoactive substance use, unspecified, uncomplicated: Secondary | ICD-10-CM

## 2016-03-18 DIAGNOSIS — I82621 Acute embolism and thrombosis of deep veins of right upper extremity: Secondary | ICD-10-CM

## 2016-03-18 LAB — CULTURE, BLOOD (ROUTINE X 2)

## 2016-03-18 LAB — CBC
HEMATOCRIT: 39.2 % (ref 39.0–52.0)
HEMOGLOBIN: 13.6 g/dL (ref 13.0–17.0)
MCH: 30.6 pg (ref 26.0–34.0)
MCHC: 34.7 g/dL (ref 30.0–36.0)
MCV: 88.3 fL (ref 78.0–100.0)
Platelets: 310 10*3/uL (ref 150–400)
RBC: 4.44 MIL/uL (ref 4.22–5.81)
RDW: 12.5 % (ref 11.5–15.5)
WBC: 9 10*3/uL (ref 4.0–10.5)

## 2016-03-18 NOTE — Progress Notes (Signed)
Talked to pt about removing his iv and moving to a camera room as asked by MD. Pt became irate about message coming from RN instead of the MD himself. Pt demanded that MD come and speak to him himself before he could comply with any orders

## 2016-03-18 NOTE — Progress Notes (Signed)
Lovenox administered late as pt declined med admin at scheduled time

## 2016-03-18 NOTE — Progress Notes (Signed)
[facility] PROGRESS NOTE  Carl Valencia ZOX:[id number] DOB: 07/31/1981 DOA: 03/09/2016 PCP: No PCP Per Patient  Brief narrative 35 year old male with polysubstance abuse (cocaine, opiate and IV heroin) with hospitalizations at several CTs including [facility], [facility], [facility] and [facility]  history of spinal osteomyelitis involving T8-T9 with cultures positive for Pseudomonas. Previously admitted to [facility] in 2014 for vertebral osteomyelitis and was on IV antibiotics when he was persistently spiking fever. The clinician that had suspicion that patient was tampering  with his IV access during hospitalization. Records from both [facility] and [facility] reported that patient was previously manipulating his IV lines to self administer narcotics and? Autoimmune enucleating insulin bacteria. (During his hospitalization at [facility] he had cultures growing mycobacteria and different Candida species) Patient admitted to hospitalist service on 3/27 with abdominal pain associated with bloody stool. Urine drug screen was positive for cocaine. No further bloody stools noted. Was seen by GI initially recommended this could be related to ischemic colitis but no further workup recommended. Patient having frequent temperature spikes with extensive workup done negative for obvious source. Blood cultures from 3/28 grew 1/2 coag-negative staph. Repeat blood culture on 4/1 again grew 1/2 coag-negative staph. MRI of the thoracic and lumbar spine done negative for acute infection. Patient being monitored of antibiotics.  Severe concern persists regarding tampering of IV access, malingering and self inducing fever in some way.    Assessment/Plan: Fever of unknown origin Continues to have temperature spikes. No WBC or other hemodynamic instability. Workups including pancultures, imaging and 2-D echo have been unremarkable. Continue monitor off antibiotics.  discontinue IV axis. He does have acute DVT involving the right brachial vein and  proximal right radial vein. Also has acute DVT involving the right cephalic vein. He has been started on therapeutic Lovenox. On reviewing notes from [facility] from September 2017 he did have right superficial DVT which was treated with Lovenox and Coumadin.  Polysubstance abuse with narcotic dependence and concern for malingering Patient has been on chronic oxycodone every 4 hours when necessary following osteomyelitis of the back in 2016. He was receiving IV Dilaudid and OxyContin following hospitalization here. Have discontinued all IV pain medications and getting when necessary oxycodone alternating with ibuprofen only. Patient asking for pain medications every 4 hours. Upon reviewing records from prior hospitalizations there is significant concern for tampering IV access, self inoculated positive blood cultures.  Reportedly patient is to the bathroom as soon as his given pain medications. (Similar documentation was noted when he was admitted to [facility] in the past) Also patient not allowing housekeeping to clean his room. I have discontinued his IV access and instructed to chart notes to move him into room with a camera to monitor his activities. ID consult appreciated. Have consulted psychiatry.   Right upper extremity DVT On subcutaneous Lovenox.  Tobacco abuse Nicotine patch  History of hep C Never treated.   Code Status: Full code Family Communication: Fianc at bedside Disposition Plan: Inpatient monitoring given ongoing fever   Consultants:  ID  Psychiatry consulted  Procedures:  MRI cervical and thoracic spine CT right arm CT chest abdomen and pelvis with contrast  Antibiotics:  Cipro Flagyl and IV vancomycin 3/28-3/30    HPI/Subjective: Patient still having temperature spikes. Irritable stating "why am I be monitored off antibiotics when I'm having fever for last 10 days"  Objective: Filed Vitals:   03/17/16 2027 03/18/16 0652  BP: 93/67 129/72  Pulse: 94 90   Temp:  102.6 F (39.2 C)  Resp:  18 20   No intake or output data in the 24 hours ending 03/18/16 1639 Filed Weights   03/08/16 2306 03/09/16 1921  Weight: 78.019 kg (172 lb) 77.066 kg (169 lb 14.4 oz)    Exam:   General:  Middle aged male not in distress  HEENT: Moist mucosa  Cardiovascular: Normal S1 and S2, no murmurs rubs or gallop  Respiratory: Clear bilaterally  Abdomen: Soft, nondistended, nontender, bowel sounds present  Musculoskeletal: Warm, no edema, mild erythema over left peripheral IV site  CNS: Alert and oriented, irritable  Data Reviewed: Basic Metabolic Panel:  Recent Labs Lab 03/13/16 0541 03/14/16 0628 03/15/16 0626 03/17/16 0621  NA 136 135 134* 139  K 3.4* 3.7 3.7 4.1  CL 107 105 101 103  CO2 21* 21* 20* 24  GLUCOSE 107* 85 126* 107*  BUN <5* 7 <5* <5*  CREATININE 0.90 0.86 0.98 0.73  CALCIUM 8.7* 8.5* 8.5* 8.8*   Liver Function Tests:  Recent Labs Lab 03/15/16 0626  AST 22  ALT 23  ALKPHOS 64  BILITOT 1.9*  PROT 6.5  ALBUMIN 3.2*   No results for input(s): LIPASE, AMYLASE in the last 168 hours. No results for input(s): AMMONIA in the last 168 hours. CBC:  Recent Labs Lab 03/13/16 0541 03/14/16 0628 03/15/16 0626 03/17/16 0621 03/18/16 0505  WBC 5.6 6.6 12.7* 8.7 9.0  HGB 13.4 13.8 13.6 12.6* 13.6  HCT 37.5* 40.0 39.7 37.3* 39.2  MCV 86.4 87.3 87.4 87.1 88.3  PLT 101* 112* 145* 265 310   Cardiac Enzymes:  Recent Labs Lab 03/16/16 0931  CKTOTAL 28*   BNP (last 3 results) No results for input(s): BNP in the last 8760 hours.  ProBNP (last 3 results) No results for input(s): PROBNP in the last 8760 hours.  CBG: No results for input(s): GLUCAP in the last 168 hours.  Recent Results (from the past 240 hour(s))  Urine culture     Status: None   Collection Time: 03/09/16  4:59 PM  Result Value Ref Range Status   Specimen Description URINE, CLEAN CATCH  Final   Special Requests NONE  Final   Culture 3,000  COLONIES/mL INSIGNIFICANT GROWTH  Final   Report Status 03/11/2016 FINAL  Final  Culture, blood (routine x 2)     Status: None   Collection Time: 03/10/16  4:12 AM  Result Value Ref Range Status   Specimen Description BLOOD RIGHT HAND  Final   Special Requests BOTTLES DRAWN AEROBIC ONLY 5CC  Final   Culture NO GROWTH 5 DAYS  Final   Report Status 03/15/2016 FINAL  Final  Culture, blood (routine x 2)     Status: None   Collection Time: 03/10/16 11:55 AM  Result Value Ref Range Status   Specimen Description BLOOD RIGHT ARM  Final   Special Requests IN PEDIATRIC BOTTLE 1.5CC  Final   Culture  Setup Time   Final    GRAM POSITIVE COCCI IN CLUSTERS IN PEDIATRIC BOTTLE CRITICAL RESULT CALLED TO, READ BACK BY AND VERIFIED WITH: C. THOMAS,RN AT 0848 ON [id number] BY Lucienne Capers    Culture   Final    STAPHYLOCOCCUS SPECIES (COAGULASE NEGATIVE) THE SIGNIFICANCE OF ISOLATING THIS ORGANISM FROM A SINGLE SET OF BLOOD CULTURES WHEN MULTIPLE SETS ARE DRAWN IS UNCERTAIN. PLEASE NOTIFY THE MICROBIOLOGY DEPARTMENT WITHIN ONE WEEK IF SPECIATION AND SENSITIVITIES ARE REQUIRED.    Report Status 03/13/2016 FINAL  Final  Culture, blood (routine x 2)     Status: None  Collection Time: 03/10/16 11:59 AM  Result Value Ref Range Status   Specimen Description BLOOD RIGHT ARM  Final   Special Requests IN PEDIATRIC BOTTLE 1.5CC  Final   Culture NO GROWTH 5 DAYS  Final   Report Status 03/15/2016 FINAL  Final  MRSA PCR Screening     Status: Abnormal   Collection Time: 03/10/16 12:36 PM  Result Value Ref Range Status   MRSA by PCR POSITIVE (A) NEGATIVE Final    Comment:        The GeneXpert MRSA Assay (FDA approved for NASAL specimens only), is one component of a comprehensive MRSA colonization surveillance program. It is not intended to diagnose MRSA infection nor to guide or monitor treatment for MRSA infections. RESULT CALLED TO, READ BACK BY AND VERIFIED WITH: L MOSS RN [id number] 03/10/16 A BROWNING    Culture, blood (Routine X 2) w Reflex to ID Panel     Status: None (Preliminary result)   Collection Time: 03/14/16  4:24 PM  Result Value Ref Range Status   Specimen Description BLOOD LEFT HAND  Final   Special Requests BOTTLES DRAWN AEROBIC ONLY 4CC  Final   Culture NO GROWTH 4 DAYS  Final   Report Status PENDING  Incomplete  Culture, blood (Routine X 2) w Reflex to ID Panel     Status: None   Collection Time: 03/14/16  4:27 PM  Result Value Ref Range Status   Specimen Description BLOOD RIGHT HAND  Final   Special Requests BOTTLES DRAWN AEROBIC ONLY 5CC  Final   Culture  Setup Time   Final    AEROBIC BOTTLE ONLY GRAM POSITIVE COCCI IN CLUSTERS CRITICAL RESULT CALLED TO, READ BACK BY AND VERIFIED WITH: J.MCBRIDE,RN 0007 03/16/16 M.CAMPBELL    Culture STAPHYLOCOCCUS SPECIES (COAGULASE NEGATIVE)  Final   Report Status 03/18/2016 FINAL  Final   Organism ID, Bacteria STAPHYLOCOCCUS SPECIES (COAGULASE NEGATIVE)  Final      Susceptibility   Staphylococcus species (coagulase negative) - MIC*    CIPROFLOXACIN <=0.5 SENSITIVE Sensitive     ERYTHROMYCIN <=0.25 SENSITIVE Sensitive     GENTAMICIN <=0.5 SENSITIVE Sensitive     OXACILLIN <=0.25 SENSITIVE Sensitive     TETRACYCLINE <=1 SENSITIVE Sensitive     VANCOMYCIN 1 SENSITIVE Sensitive     TRIMETH/SULFA <=10 SENSITIVE Sensitive     CLINDAMYCIN <=0.25 SENSITIVE Sensitive     RIFAMPIN <=0.5 SENSITIVE Sensitive     Inducible Clindamycin NEGATIVE Sensitive     * STAPHYLOCOCCUS SPECIES (COAGULASE NEGATIVE)  Respiratory virus panel     Status: None   Collection Time: 03/14/16  8:42 PM  Result Value Ref Range Status   Source - [facility] NASAL SWAB  Corrected   Respiratory Syncytial Virus A Negative Negative Final   Respiratory Syncytial Virus B Negative Negative Final   Influenza A Negative Negative Final   Influenza B Negative Negative Final   Parainfluenza 1 Negative Negative Final   Parainfluenza 2 Negative Negative Final   Parainfluenza  3 Negative Negative Final   Metapneumovirus Negative Negative Final   Rhinovirus Negative Negative Final   Adenovirus Negative Negative Final    Comment: (NOTE) Performed At: [facility] [address] [place], [place] [id number] Mila Homer MD NF:[id number]      Studies: Mr Cervical Spine Wo Contrast  03/17/2016  CLINICAL DATA:  Fever of unknown origin. IV drug abuser. History of osteomyelitis of the thoracic spine. Neck pain and back pain. EXAM: MRI CERVICAL SPINE WITHOUT CONTRAST; MRI THORACIC  SPINE WITHOUT CONTRAST TECHNIQUE: Multiplanar and multiecho pulse sequences of the cervical spine, to include the craniocervical junction and cervicothoracic junction, were obtained according to standard protocol without intravenous contrast.; Multiplanar and multiecho pulse sequences of the thoracic spine were obtained without intravenous contrast. CONTRAST:  None. The study was ordered without contrast but the patient could not cooperate for administration of such. COMPARISON:  CT chest abdomen and pelvis 03/15/2016. MRI of thoracic spine 05/14/2013. FINDINGS: CERVICAL: Alignment: Reversal of the normal cervical lordotic curve. No subluxation. Vertebrae: No worrisome osseous lesions.  No signs of osteomyelitis. Cord: No cord compression or abnormal cord signal. No intraspinal mass lesion. Posterior Fossa: No tonsillar herniation. Vertebral Arteries: BILATERAL patent. Paraspinal tissues: Unremarkable. Disc levels: The individual disc spaces were examined as follows: C2-3:  Normal. C3-4:  Normal. C4-5:  Mild bulge.  No impingement. C5-6:  Mild bulge.  No impingement. C6-7:  Normal. C7-T1:  Normal. THORACIC: Sequelae of previous T7-8 osteomyelitis, with obliteration of the disc space and fusion across the endplates. No features elsewhere suggestive of discitis or osteomyelitis. No residual bone marrow edema. No spinal stenosis or intraspinal mass. Normal cord size and signal throughout. Termination  of the conus at approximately L1. Within limits for detection due to patient motion, unremarkable paravertebral soft tissues. Splenomegaly. IMPRESSION: CERVICAL: No disc protrusion or spinal stenosis. No discitis or osteomyelitis. THORACIC: No disc protrusion or spinal stenosis. Sequelae of healed discitis and osteomyelitis at T7-8. No discitis or osteomyelitis. Electronically Signed   By: Elsie Stain M.D.   On: 03/17/2016 14:11   Mr Thoracic Spine Wo Contrast  03/17/2016  CLINICAL DATA:  Fever of unknown origin. IV drug abuser. History of osteomyelitis of the thoracic spine. Neck pain and back pain. EXAM: MRI CERVICAL SPINE WITHOUT CONTRAST; MRI THORACIC SPINE WITHOUT CONTRAST TECHNIQUE: Multiplanar and multiecho pulse sequences of the cervical spine, to include the craniocervical junction and cervicothoracic junction, were obtained according to standard protocol without intravenous contrast.; Multiplanar and multiecho pulse sequences of the thoracic spine were obtained without intravenous contrast. CONTRAST:  None. The study was ordered without contrast but the patient could not cooperate for administration of such. COMPARISON:  CT chest abdomen and pelvis 03/15/2016. MRI of thoracic spine 05/14/2013. FINDINGS: CERVICAL: Alignment: Reversal of the normal cervical lordotic curve. No subluxation. Vertebrae: No worrisome osseous lesions.  No signs of osteomyelitis. Cord: No cord compression or abnormal cord signal. No intraspinal mass lesion. Posterior Fossa: No tonsillar herniation. Vertebral Arteries: BILATERAL patent. Paraspinal tissues: Unremarkable. Disc levels: The individual disc spaces were examined as follows: C2-3:  Normal. C3-4:  Normal. C4-5:  Mild bulge.  No impingement. C5-6:  Mild bulge.  No impingement. C6-7:  Normal. C7-T1:  Normal. THORACIC: Sequelae of previous T7-8 osteomyelitis, with obliteration of the disc space and fusion across the endplates. No features elsewhere suggestive of discitis  or osteomyelitis. No residual bone marrow edema. No spinal stenosis or intraspinal mass. Normal cord size and signal throughout. Termination of the conus at approximately L1. Within limits for detection due to patient motion, unremarkable paravertebral soft tissues. Splenomegaly. IMPRESSION: CERVICAL: No disc protrusion or spinal stenosis. No discitis or osteomyelitis. THORACIC: No disc protrusion or spinal stenosis. Sequelae of healed discitis and osteomyelitis at T7-8. No discitis or osteomyelitis. Electronically Signed   By: Elsie Stain M.D.   On: 03/17/2016 14:11   Ct Extrem Up Entire Arm R W/cm  03/16/2016  CLINICAL DATA:  Pain swelling of the right arm.  Warm to the touch. EXAM: CT  OF THE UPPER RIGHT ARM WITH CONTRAST TECHNIQUE: Multidetector CT imaging was performed according to the standard protocol. Multiplanar CT image reconstructions were also generated. CONTRAST:  100 mL Omnipaque 350 COMPARISON:  None. FINDINGS: No acute fracture or dislocation. No aggressive lytic or sclerotic osseous lesion. No periosteal reaction or bone destruction. Normal glenohumeral joint. Normal elbow joint. Soft tissue edema along the volar aspect of the right forearm extending from the elbow to the wrist. Soft tissue edema within the pronator teres and brachia radialis muscles. No drainable fluid collection. No hematoma. No soft tissue emphysema or radiopaque foreign body. Visualized right lung is clear. IMPRESSION: 1. Soft tissue edema along the volar aspect of the right forearm extending from the elbow to the wrist most concerning for cellulitis. Soft tissue edema involving the brachioradialis and pronator teres muscles concerning for myositis. No drainable abscess. Electronically Signed   By: Elige KoHetal  Patel   On: 03/16/2016 21:38    Scheduled Meds: . enoxaparin (LOVENOX) injection  80 mg Subcutaneous Q12H  . nicotine  14 mg Transdermal Daily  . polyethylene glycol  17 g Oral Daily  . senna-docusate  2 tablet Oral  BID   Continuous Infusions: . sodium chloride 10 mL/hr at 03/16/16 0958      Time spent: 25 minutes    Millisa Giarrusso  [facility] Pager [phone] If 7PM-7AM, please contact night-coverage at [email or web address], password [email or web address] 03/18/2016, 4:39 PM  LOS: 9 days

## 2016-03-18 NOTE — Progress Notes (Signed)
      INFECTIOUS DISEASE ATTENDING ADDENDUM:   Date: 03/18/2016  Patient name: Carl Valencia  Medical record number: [id number]  Date of birth: 07-17-1981   We came by to see the patient and he was predictably in the bathroom after having just received his every four hour PRN dose of narcotics that he has been receiving like clockwork.  Dr. Gonzella Lexhungel has DC his IV and is moving him to a room with a camera.  I think the unifying diagnosis here based [facility] is that the patient malingers likely with intent to obtain long term IV antibiotics.  His presentation to [facility] was a GIB but it was NOT a significant GIB and then proceeded to develop fevers once he had an IV and has developed a DVT --likely from manipulation of his IV  I would also encourage careful search of his room. He has high liklihood of drug paraphernalia. Extreme caution should be used because he has known untreated Hep C     My only recommendations are to treat his DVT and to have him monitored his access to outside paraphernalia limited.   He should also be weaned from narcotics as seen fit by the primary team. I agree with psychiatry consult   I will sign off at this time  Carl Valencia 03/18/2016, 9:05 PM

## 2016-03-18 NOTE — Progress Notes (Signed)
Patient has a fever tonight of 102.6- PRN Advil given. RN asked patient to recheck temp in an hour, patient stated that he will decide if he feels like it. Will continue to monitor

## 2016-03-18 NOTE — Progress Notes (Signed)
MD spoke to pt and he was agreeable to moving rooms and having his iv access discontinued

## 2016-03-19 ENCOUNTER — Emergency Department (HOSPITAL_COMMUNITY)
Admission: EM | Admit: 2016-03-19 | Discharge: 2016-03-20 | Disposition: A | Discharge status: Home / Self Care | Payer: Self-pay | Attending: Emergency Medicine | Admitting: Emergency Medicine

## 2016-03-19 ENCOUNTER — Encounter (HOSPITAL_COMMUNITY): Payer: Self-pay | Admitting: Emergency Medicine

## 2016-03-19 ENCOUNTER — Emergency Department (HOSPITAL_COMMUNITY): Payer: Self-pay

## 2016-03-19 DIAGNOSIS — F1120 Opioid dependence, uncomplicated: Secondary | ICD-10-CM

## 2016-03-19 DIAGNOSIS — F0630 Mood disorder due to known physiological condition, unspecified: Secondary | ICD-10-CM

## 2016-03-19 DIAGNOSIS — Z8719 Personal history of other diseases of the digestive system: Secondary | ICD-10-CM | POA: Insufficient documentation

## 2016-03-19 DIAGNOSIS — L03114 Cellulitis of left upper limb: Secondary | ICD-10-CM | POA: Insufficient documentation

## 2016-03-19 DIAGNOSIS — F1994 Other psychoactive substance use, unspecified with psychoactive substance-induced mood disorder: Secondary | ICD-10-CM

## 2016-03-19 DIAGNOSIS — Z8739 Personal history of other diseases of the musculoskeletal system and connective tissue: Secondary | ICD-10-CM | POA: Insufficient documentation

## 2016-03-19 DIAGNOSIS — Z86718 Personal history of other venous thrombosis and embolism: Secondary | ICD-10-CM | POA: Insufficient documentation

## 2016-03-19 DIAGNOSIS — Z880 Allergy status to penicillin: Secondary | ICD-10-CM | POA: Insufficient documentation

## 2016-03-19 DIAGNOSIS — Z87442 Personal history of urinary calculi: Secondary | ICD-10-CM | POA: Insufficient documentation

## 2016-03-19 DIAGNOSIS — Z87828 Personal history of other (healed) physical injury and trauma: Secondary | ICD-10-CM | POA: Insufficient documentation

## 2016-03-19 DIAGNOSIS — L03113 Cellulitis of right upper limb: Secondary | ICD-10-CM | POA: Insufficient documentation

## 2016-03-19 DIAGNOSIS — F1920 Other psychoactive substance dependence, uncomplicated: Secondary | ICD-10-CM

## 2016-03-19 DIAGNOSIS — F17210 Nicotine dependence, cigarettes, uncomplicated: Secondary | ICD-10-CM | POA: Insufficient documentation

## 2016-03-19 DIAGNOSIS — Z720 Tobacco use: Secondary | ICD-10-CM

## 2016-03-19 LAB — CBC WITH DIFFERENTIAL/PLATELET
BASOS PCT: 0 %
Basophils Absolute: 0 10*3/uL (ref 0.0–0.1)
EOS ABS: 0.1 10*3/uL (ref 0.0–0.7)
EOS PCT: 1 %
HCT: 38.4 % — ABNORMAL LOW (ref 39.0–52.0)
HEMOGLOBIN: 13.6 g/dL (ref 13.0–17.0)
LYMPHS ABS: 3 10*3/uL (ref 0.7–4.0)
Lymphocytes Relative: 32 %
MCH: 30.8 pg (ref 26.0–34.0)
MCHC: 35.4 g/dL (ref 30.0–36.0)
MCV: 87.1 fL (ref 78.0–100.0)
MONO ABS: 0.9 10*3/uL (ref 0.1–1.0)
MONOS PCT: 9 %
NEUTROS PCT: 58 %
Neutro Abs: 5.5 10*3/uL (ref 1.7–7.7)
Platelets: 399 10*3/uL (ref 150–400)
RBC: 4.41 MIL/uL (ref 4.22–5.81)
RDW: 12.3 % (ref 11.5–15.5)
WBC: 9.4 10*3/uL (ref 4.0–10.5)

## 2016-03-19 LAB — I-STAT CG4 LACTIC ACID, ED: LACTIC ACID, VENOUS: 1.98 mmol/L (ref 0.5–2.0)

## 2016-03-19 LAB — COMPREHENSIVE METABOLIC PANEL
ALK PHOS: 64 U/L (ref 38–126)
ALT: 20 U/L (ref 17–63)
AST: 22 U/L (ref 15–41)
Albumin: 3.2 g/dL — ABNORMAL LOW (ref 3.5–5.0)
Anion gap: 14 (ref 5–15)
BUN: 8 mg/dL (ref 6–20)
CALCIUM: 9.3 mg/dL (ref 8.9–10.3)
CHLORIDE: 102 mmol/L (ref 101–111)
CO2: 20 mmol/L — AB (ref 22–32)
CREATININE: 0.95 mg/dL (ref 0.61–1.24)
GFR calc Af Amer: >60 mL/min (ref >60)
GFR calc non Af Amer: >60 mL/min (ref >60)
GLUCOSE: 101 mg/dL — AB (ref 65–99)
Potassium: 4.1 mmol/L (ref 3.5–5.1)
SODIUM: 136 mmol/L (ref 135–145)
Total Bilirubin: 1.1 mg/dL (ref 0.3–1.2)
Total Protein: 8.4 g/dL — ABNORMAL HIGH (ref 6.5–8.1)

## 2016-03-19 LAB — QUANTIFERON IN TUBE
QFT TB AG MINUS NIL VALUE: 0.03 [IU]/mL
QUANTIFERON MITOGEN VALUE: 2.76 [IU]/mL
QUANTIFERON NIL VALUE: 0.12 [IU]/mL
QUANTIFERON TB AG VALUE: 0.15 IU/mL
QUANTIFERON TB GOLD: NEGATIVE

## 2016-03-19 LAB — CBC
HCT: 35.3 % — ABNORMAL LOW (ref 39.0–52.0)
HEMOGLOBIN: 12.2 g/dL — AB (ref 13.0–17.0)
MCH: 30.1 pg (ref 26.0–34.0)
MCHC: 34.6 g/dL (ref 30.0–36.0)
MCV: 87.2 fL (ref 78.0–100.0)
Platelets: 298 10*3/uL (ref 150–400)
RBC: 4.05 MIL/uL — ABNORMAL LOW (ref 4.22–5.81)
RDW: 12.4 % (ref 11.5–15.5)
WBC: 7.5 10*3/uL (ref 4.0–10.5)

## 2016-03-19 LAB — QUANTIFERON TB GOLD ASSAY (BLOOD)

## 2016-03-19 LAB — CULTURE, BLOOD (ROUTINE X 2): Culture: NO GROWTH

## 2016-03-19 LAB — URINE DRUGS OF ABUSE SCREEN W ALC, ROUTINE (REF LAB)
Amphetamines, Urine: NEGATIVE ng/mL
BENZODIAZEPINE QUANT UR: NEGATIVE ng/mL
Barbiturate, Ur: NEGATIVE ng/mL
CANNABINOID QUANT UR: NEGATIVE ng/mL
Cocaine (Metab.): NEGATIVE ng/mL
Ethanol U, Quan: NEGATIVE %
METHADONE SCREEN, URINE: NEGATIVE ng/mL
PHENCYCLIDINE, UR: NEGATIVE ng/mL
Propoxyphene, Urine: NEGATIVE ng/mL

## 2016-03-19 LAB — OPIATES CONFIRMATION, URINE: OPIATES: NEGATIVE

## 2016-03-19 MED ORDER — SODIUM CHLORIDE 0.9 % IV BOLUS (SEPSIS)
1000.0000 mL | Freq: Once | INTRAVENOUS | Status: AC
  Administered 2016-03-20: 1000 mL via INTRAVENOUS

## 2016-03-19 MED ORDER — ENOXAPARIN SODIUM 80 MG/0.8ML ~~LOC~~ SOLN
1.0000 mg/kg | Freq: Once | SUBCUTANEOUS | Status: AC
  Administered 2016-03-20: 75 mg via SUBCUTANEOUS
  Filled 2016-03-19: qty 0.8

## 2016-03-19 MED ORDER — APIXABAN 5 MG PO TABS: 5.0000 mg | ORAL_TABLET | Freq: Two times a day (BID) | ORAL | Status: DC

## 2016-03-19 MED ORDER — IBUPROFEN 400 MG PO TABS: 400.0000 mg | ORAL_TABLET | Freq: Four times a day (QID) | ORAL | Status: DC | PRN

## 2016-03-19 MED ORDER — IBUPROFEN 800 MG PO TABS
800.0000 mg | ORAL_TABLET | Freq: Once | ORAL | Status: AC
  Administered 2016-03-20: 800 mg via ORAL
  Filled 2016-03-19: qty 1

## 2016-03-19 MED ORDER — CLINDAMYCIN PHOSPHATE 600 MG/50ML IV SOLN
600.0000 mg | Freq: Once | INTRAVENOUS | Status: AC
  Administered 2016-03-20: 600 mg via INTRAVENOUS
  Filled 2016-03-19: qty 50

## 2016-03-19 NOTE — Progress Notes (Signed)
Discharge paperwork given to patient and reviewed. Prescriptions for ibuprofen and eliquis. Patient is requesting two bus passes. SW is in the process of bringing them up so patient can be discharged.
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
## 2016-03-19 NOTE — Clinical Social Work Note (Cosign Needed)
Patient requested assistance with transportation. CSW provided patient's nurse with the two requested buss passes. There were no further questions or concerns at this time.

## 2016-03-19 NOTE — ED Notes (Addendum)
Pt states he was told by the doctor who d/c'd him today that he felt like all of his symptoms were related to his "drug-seeking" past and that the patient "feels like there's still something wrong with me".  Pt states he feels weak, tired, and "unsafe to go home like this".

## 2016-03-19 NOTE — ED Notes (Signed)
Pt was d/c'ed from hospital today and had a blood clot in his right arm. Pt also had redness and swelling to left lower arm from and infiltrated IV. Pt states he has been having chills and feeling lightheaded for the last few hours while waiting on a ride to leave the hospital.
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
## 2016-03-19 NOTE — Discharge Summary (Addendum)
Physician Discharge Summary  Carl Valencia ZOX:[id number] DOB: [birth year] DOA: 03/09/2016  PCP: No PCP Per Patient  Admit date: 03/09/2016 Discharge date: 03/19/2016  Time spent: 35 minutes  Recommendations for Outpatient Follow-up:  1. Discharge home.  2. Patient being discharged on eliquis for DVT pf right arm. Total duration of treatment will be 3 months. Given card for  1 month  free supply. He needs to fill out paperwork and establish care in [place] to get further prescriptions. Stop date for eliquis is 06/16/2016.  3. Patient known for polysubstance abuse, malingering behavior. Recommend avoiding narcotics and benzos as much possible, if presented to ED for pain issues .    Discharge Diagnoses:  Principal Problem:   FUO (fever of unknown origin)   Active Problems:   Chronic narcotic dependence (HCC)   Hepatitis C   Tobacco abuse   Osteomyelitis of thoracic region (HCC)   RLQ abdominal pain   Rectal bleed   Lower GI bleeding   IVDU (intravenous drug user)   Arm DVT (deep venous thromboembolism), acute (HCC)   Malingering   Drug dependence, continuous abuse (HCC)   Psychoactive substance-induced mood disorder (HCC)   Discharge Condition: FAIR  Diet recommendation: regular  Filed Weights   03/08/16 2306 03/09/16 1921  Weight: 78.019 kg (172 lb) 77.066 kg (169 lb 14.4 oz)    History of present illness:  Please refer to admission H&P for details, in brief,35 year old male with polysubstance abuse (cocaine, opiate and IV heroin) with hospitalizations at several CTs including [facility], [facility], [facility] and [facility] history of spinal osteomyelitis involving T8-T9 with cultures positive for Pseudomonas. Previously admitted to [facility] in 2014 for vertebral osteomyelitis and was on IV antibiotics when he was persistently spiking fever. The clinician that had suspicion that patient was tampering with his IV access during hospitalization. Records from both [facility] and [facility]  reported that patient was previously manipulating his IV lines to self administer narcotics and? Autoimmune enucleating insulin bacteria. (During his hospitalization at [facility] he had cultures growing mycobacteria and different Candida species) Patient admitted to hospitalist service on 3/27 with abdominal pain associated with bloody stool. Urine drug screen was positive for cocaine. No further bloody stools noted. Was seen by GI initially recommended this could be related to ischemic colitis but no further workup recommended. Patient having frequent temperature spikes with extensive workup done negative for obvious source. Blood cultures from 3/28 grew 1/2 coag-negative staph. Repeat blood culture on 4/1 again grew 1/2 coag-negative staph. MRI of the thoracic and lumbar spine done negative for acute infection. Patient being monitored of antibiotics.  Severe concern persists regarding tampering of IV access, malingering and self inducing fever in some way in order to obtain long-term IV antibiotics and pain seeking behavior.  Hospital Course:  Fever of unknown origin Having frequent temperature spikes of unclear etiology. No WBC or other hemodynamic instability. Workups including pancultures, MRI of the spine and 2-D echo have been unremarkable. Patient monitored off antibiotics. Only explanation could be acute DVT of the right upper extremity. -IV access has been discontinued.  -Strong concern of patient self generating fever similar patterns were evident when he was hospitalized at other facilities in the past. Patient disappears into the bathroom for a long time during the day, not taking medications on time, asking for pain medications around the clock and not letting housekeeping clean his room. ID have signed off. No plan on prescribing any antibiotics or further workup.  Polysubstance abuse with narcotic dependence and  concern for malingering Reportedly, Patient has been on chronic oxycodone   following osteomyelitis of the back in 2016. I do not see him being prescribed narcotics consistently as outpatient. He was receiving IV Dilaudid and OxyContin following hospitalization here.  We discontinued all IV pain medications and was getting when necessary oxycodone alternating with ibuprofen only. Patient asking for pain medications every 4 hours. Upon reviewing records from prior hospitalizations there is significant concern for tampering IV access, self inoculated positive blood cultures.  Reportedly patient is to the bathroom as soon as his given pain medications. (Similar documentation was noted when he was admitted to [facility] in the past) -Patient was also monitored in a camera room a few hours today. -Seen by psychiatry and patient denied any anxiety, depression or psychosis. Denied any suicidal or homicidal ideations. Patient urine drug screen on admission was positive for cocaine and opiates. He told the psychiatrist that he uses drugs occasionally. Refused any substance abuse counseling or psychiatric medications.  Patient will be discharged home. I Have not prescribed any narcotics. Should avoid prescription of narcotics unless he has established care as outpatient or in the pain clinic.  Right upper extremity DVT Has acute DVT involving the right brachial vein and proximal right radial vein. Also has acute DVT involving the right cephalic vein. Treated with subcutaneous Lovenox while in the hospital. Discharged on Eliquis. His manager has given 30 day 3 Eliquis card to the patient. Has also given an application for Eliquis assistant since. Patient needs to complete application so that he can be approved for further medication before he runs out of his 30 day supply. He also needs to establish care at [facility] free clinic (information provided) where the M.D. can complete section of his application. Patient verbally understands.  Tobacco abuse Nicotine patch  History of hep  C Never treated. Needs to establish outpatient care and psychosocial issues be more stable before deciding to treat his hep C.  Lower GI bleed on presentation No further episodes. H&H stable. I suspect this is also part of malingering. Seen by [facility] GI signed off.   Code Status: Full code Family Communication: Fianc at bedside Disposition Plan: Discharge home   Consultants:  ID  Psychiatry   [facility]  Procedures:  MRI cervical and thoracic spine CT right arm CT chest abdomen and pelvis with contrast Doppler right upper extremity  Antibiotics:  Cipro Flagyl and IV vancomycin 3/28-3/30  Discharge Exam: Filed Vitals:   03/18/16 1610 03/18/16 2150  Pulse: 90 96  Resp: 20 20     General: Middle aged male not in distress  HEENT: Moist mucosa  Cardiovascular: Normal S1 and S2, no murmurs rubs or gallop  Respiratory: Clear bilaterally  Abdomen: Soft, nondistended, nontender, bowel sounds present  Musculoskeletal: Warm, no edema, mild erythema over left peripheral IV site  CNS: Alert and oriented, irritable   Discharge Instructions    Current Discharge Medication List    START taking these medications   Details  apixaban (ELIQUIS) 5 MG TABS tablet Take 1 tablet (5 mg total) by mouth 2 (two) times daily. Qty: 74 tablet, Refills: 0    ibuprofen (ADVIL,MOTRIN) 400 MG tablet Take 1 tablet (400 mg total) by mouth every 6 (six) hours as needed for fever, headache or mild pain. Qty: 30 tablet, Refills: 0      STOP taking these medications     oxyCODONE (ROXICODONE) 15 MG immediate release tablet      oxyCODONE (ROXICODONE) 5 MG  immediate release tablet        Allergies  Allergen Reactions  . Cyclobenzaprine Other (See Comments)  . Darvocet [Propoxyphene N-Acetaminophen] Anaphylaxis  . Penicillins Anaphylaxis and Hives    Has patient had a PCN reaction causing immediate rash, facial/tongue/throat swelling, SOB or lightheadedness with hypotension:  Yes Has patient had a PCN reaction causing severe rash involving mucus membranes or skin necrosis: No Has patient had a PCN reaction that required hospitalization No Has patient had a PCN reaction occurring within the last 10 years: No If all of the above answers are "NO", then may proceed with Cephalosporin use.   . Sulfa Antibiotics Anaphylaxis  . Toradol [Ketorolac Tromethamine] Anaphylaxis  . Morphine And Related Hives  . Fentanyl Swelling    Throat swelling  . Trazodone And Nefazodone     Restless legs  . Tylenol [Acetaminophen] Hives    Patient has sever liver damage, will and should not take tylenol   Follow-up Information    Please follow up.   Why:  free outpatient clinic in [facility]       The results of significant diagnostics from this hospitalization (including imaging, microbiology, ancillary and laboratory) are listed below for reference.    Significant Diagnostic Studies: Ct Chest W Contrast  03/15/2016  CLINICAL DATA:  35 year old male with fever of unknown origin, right and abdominal pelvic pain, nausea and GI bleed. EXAM: CT CHEST, ABDOMEN, AND PELVIS WITH CONTRAST TECHNIQUE: Multidetector CT imaging of the chest, abdomen and pelvis was performed following the standard protocol during bolus administration of intravenous contrast. CONTRAST:  ISOVUE-300 IOPAMIDOL (ISOVUE-300) INJECTION 61% COMPARISON:  02/29/2016 abdominal and pelvic CT. FINDINGS: CT CHEST Mediastinum/Nodes: The heart and great vessels are unremarkable. No enlarged lymph nodes, mediastinal mass or pericardial effusion identified. Lungs/Pleura: Unremarkable. There is no evidence of airspace disease, consolidation, mass, suspicious nodule or endobronchial/endotracheal lesion. No pleural effusions or pneumothorax identified. Musculoskeletal: No acute or suspicious abnormalities. Congenital fusion of T7-T8 noted. CT ABDOMEN AND PELVIS Hepatobiliary: The liver and gallbladder are unremarkable. There  is no evidence of biliary dilatation. Pancreas: Unremarkable Spleen: Splenomegaly identified with a splenic volume of 900 cc. No focal splenic lesions are identified. Adrenals/Urinary Tract: The kidneys, adrenal glands and bladder are unremarkable. Stomach/Bowel: Unremarkable. No evidence of bowel obstruction or definite bowel wall thickening. Vascular/Lymphatic: Unremarkable. No abdominal aortic aneurysm or enlarged lymph nodes. Reproductive: Prostate unremarkable Other: No free fluid, abscess or pneumoperitoneum. Musculoskeletal: No acute or suspicious abnormalities identified. IMPRESSION: No acute abnormalities. No findings to suggest a source for this patient's fever or right abdominal pain. Moderate splenomegaly. Electronically Signed   By: Harmon Pier M.D.   On: 03/15/2016 15:59   Mr Cervical Spine Wo Contrast  03/17/2016  CLINICAL DATA:  Fever of unknown origin. IV drug abuser. History of osteomyelitis of the thoracic spine. Neck pain and back pain. EXAM: MRI CERVICAL SPINE WITHOUT CONTRAST; MRI THORACIC SPINE WITHOUT CONTRAST TECHNIQUE: Multiplanar and multiecho pulse sequences of the cervical spine, to include the craniocervical junction and cervicothoracic junction, were obtained according to standard protocol without intravenous contrast.; Multiplanar and multiecho pulse sequences of the thoracic spine were obtained without intravenous contrast. CONTRAST:  None. The study was ordered without contrast but the patient could not cooperate for administration of such. COMPARISON:  CT chest abdomen and pelvis 03/15/2016. MRI of thoracic spine 05/14/2013. FINDINGS: CERVICAL: Alignment: Reversal of the normal cervical lordotic curve. No subluxation. Vertebrae: No worrisome osseous lesions.  No signs of osteomyelitis. Cord: No cord  compression or abnormal cord signal. No intraspinal mass lesion. Posterior Fossa: No tonsillar herniation. Vertebral Arteries: BILATERAL patent. Paraspinal tissues: Unremarkable. Disc  levels: The individual disc spaces were examined as follows: C2-3:  Normal. C3-4:  Normal. C4-5:  Mild bulge.  No impingement. C5-6:  Mild bulge.  No impingement. C6-7:  Normal. C7-T1:  Normal. THORACIC: Sequelae of previous T7-8 osteomyelitis, with obliteration of the disc space and fusion across the endplates. No features elsewhere suggestive of discitis or osteomyelitis. No residual bone marrow edema. No spinal stenosis or intraspinal mass. Normal cord size and signal throughout. Termination of the conus at approximately L1. Within limits for detection due to patient motion, unremarkable paravertebral soft tissues. Splenomegaly. IMPRESSION: CERVICAL: No disc protrusion or spinal stenosis. No discitis or osteomyelitis. THORACIC: No disc protrusion or spinal stenosis. Sequelae of healed discitis and osteomyelitis at T7-8. No discitis or osteomyelitis. Electronically Signed   By: Elsie StainJohn T Curnes M.D.   On: 03/17/2016 14:11   Mr Thoracic Spine Wo Contrast  03/17/2016  CLINICAL DATA:  Fever of unknown origin. IV drug abuser. History of osteomyelitis of the thoracic spine. Neck pain and back pain. EXAM: MRI CERVICAL SPINE WITHOUT CONTRAST; MRI THORACIC SPINE WITHOUT CONTRAST TECHNIQUE: Multiplanar and multiecho pulse sequences of the cervical spine, to include the craniocervical junction and cervicothoracic junction, were obtained according to standard protocol without intravenous contrast.; Multiplanar and multiecho pulse sequences of the thoracic spine were obtained without intravenous contrast. CONTRAST:  None. The study was ordered without contrast but the patient could not cooperate for administration of such. COMPARISON:  CT chest abdomen and pelvis 03/15/2016. MRI of thoracic spine 05/14/2013. FINDINGS: CERVICAL: Alignment: Reversal of the normal cervical lordotic curve. No subluxation. Vertebrae: No worrisome osseous lesions.  No signs of osteomyelitis. Cord: No cord compression or abnormal cord signal. No  intraspinal mass lesion. Posterior Fossa: No tonsillar herniation. Vertebral Arteries: BILATERAL patent. Paraspinal tissues: Unremarkable. Disc levels: The individual disc spaces were examined as follows: C2-3:  Normal. C3-4:  Normal. C4-5:  Mild bulge.  No impingement. C5-6:  Mild bulge.  No impingement. C6-7:  Normal. C7-T1:  Normal. THORACIC: Sequelae of previous T7-8 osteomyelitis, with obliteration of the disc space and fusion across the endplates. No features elsewhere suggestive of discitis or osteomyelitis. No residual bone marrow edema. No spinal stenosis or intraspinal mass. Normal cord size and signal throughout. Termination of the conus at approximately L1. Within limits for detection due to patient motion, unremarkable paravertebral soft tissues. Splenomegaly. IMPRESSION: CERVICAL: No disc protrusion or spinal stenosis. No discitis or osteomyelitis. THORACIC: No disc protrusion or spinal stenosis. Sequelae of healed discitis and osteomyelitis at T7-8. No discitis or osteomyelitis. Electronically Signed   By: Elsie StainJohn T Curnes M.D.   On: 03/17/2016 14:11   Ct Abdomen Pelvis W Contrast  03/15/2016  CLINICAL DATA:  35 year old male with fever of unknown origin, right and abdominal pelvic pain, nausea and GI bleed. EXAM: CT CHEST, ABDOMEN, AND PELVIS WITH CONTRAST TECHNIQUE: Multidetector CT imaging of the chest, abdomen and pelvis was performed following the standard protocol during bolus administration of intravenous contrast. CONTRAST:  100mL ISOVUE-300 IOPAMIDOL (ISOVUE-300) INJECTION 61% COMPARISON:  02/29/2016 abdominal and pelvic CT. FINDINGS: CT CHEST Mediastinum/Nodes: The heart and great vessels are unremarkable. No enlarged lymph nodes, mediastinal mass or pericardial effusion identified. Lungs/Pleura: Unremarkable. There is no evidence of airspace disease, consolidation, mass, suspicious nodule or endobronchial/endotracheal lesion. No pleural effusions or pneumothorax identified. Musculoskeletal:  No acute or suspicious abnormalities. Congenital fusion of T7-T8 noted.  CT ABDOMEN AND PELVIS Hepatobiliary: The liver and gallbladder are unremarkable. There is no evidence of biliary dilatation. Pancreas: Unremarkable Spleen: Splenomegaly identified with a splenic volume of 900 cc. No focal splenic lesions are identified. Adrenals/Urinary Tract: The kidneys, adrenal glands and bladder are unremarkable. Stomach/Bowel: Unremarkable. No evidence of bowel obstruction or definite bowel wall thickening. Vascular/Lymphatic: Unremarkable. No abdominal aortic aneurysm or enlarged lymph nodes. Reproductive: Prostate unremarkable Other: No free fluid, abscess or pneumoperitoneum. Musculoskeletal: No acute or suspicious abnormalities identified. IMPRESSION: No acute abnormalities. No findings to suggest a source for this patient's fever or right abdominal pain. Moderate splenomegaly. Electronically Signed   By: Harmon Pier M.D.   On: 03/15/2016 15:59   Ct Abdomen Pelvis W Contrast  02/29/2016  CLINICAL DATA:  Right-sided flank pain and bloody stools EXAM: CT ABDOMEN AND PELVIS WITH CONTRAST TECHNIQUE: Multidetector CT imaging of the abdomen and pelvis was performed using the standard protocol following bolus administration of intravenous contrast. CONTRAST:  OMNIPAQUE IOHEXOL 300 MG/ML SOLN, 25mL OMNIPAQUE IOHEXOL 300 MG/ML SOLN COMPARISON:  08/24/2015 FINDINGS: Lower chest:  No acute findings. Hepatobiliary: No masses or other significant abnormality. Pancreas: No mass, inflammatory changes, or other significant abnormality. Spleen: Within normal limits in size and appearance. Adrenals/Urinary Tract: No masses identified. No evidence of hydronephrosis. Stomach/Bowel: Postsurgical changes are noted and in the region of the cecum. The appendix is not visualized and is likely been surgically removed. Scattered diverticular change is noted without diverticulitis. Vascular/Lymphatic: No pathologically enlarged lymph  nodes. No evidence of abdominal aortic aneurysm. Reproductive: No mass or other significant abnormality. Other: None. Musculoskeletal:  No suspicious bone lesions identified. IMPRESSION: No acute abnormality noted. Electronically Signed   By: Alcide Clever M.D.   On: 02/29/2016 18:35   Dg Chest Port 1 View  03/10/2016  CLINICAL DATA:  Fever. EXAM: PORTABLE CHEST 1 VIEW COMPARISON:  January 31, 2016. FINDINGS: The heart size and mediastinal contours are within normal limits. Both lungs are clear. The visualized skeletal structures are unremarkable. IMPRESSION: No active disease. Electronically Signed   By: Lupita Raider, M.D.   On: 03/10/2016 16:45   Ct Extrem Up Entire Arm R W/cm  03/16/2016  CLINICAL DATA:  Pain swelling of the right arm.  Warm to the touch. EXAM: CT OF THE UPPER RIGHT ARM WITH CONTRAST TECHNIQUE: Multidetector CT imaging was performed according to the standard protocol. Multiplanar CT image reconstructions were also generated. CONTRAST:  100 mL Omnipaque 350 COMPARISON:  None. FINDINGS: No acute fracture or dislocation. No aggressive lytic or sclerotic osseous lesion. No periosteal reaction or bone destruction. Normal glenohumeral joint. Normal elbow joint. Soft tissue edema along the volar aspect of the right forearm extending from the elbow to the wrist. Soft tissue edema within the pronator teres and brachia radialis muscles. No drainable fluid collection. No hematoma. No soft tissue emphysema or radiopaque foreign body. Visualized right lung is clear. IMPRESSION: 1. Soft tissue edema along the volar aspect of the right forearm extending from the elbow to the wrist most concerning for cellulitis. Soft tissue edema involving the brachioradialis and pronator teres muscles concerning for myositis. No drainable abscess. Electronically Signed   By: Elige Ko   On: 03/16/2016 21:38    Microbiology: Recent Results (from the past 240 hour(s))  Urine culture     Status: None   Collection  Time: 03/09/16  4:59 PM  Result Value Ref Range Status   Specimen Description URINE, CLEAN CATCH  Final   Special  Requests NONE  Final   Culture 3,000 COLONIES/mL INSIGNIFICANT GROWTH  Final   Report Status 03/11/2016 FINAL  Final  Culture, blood (routine x 2)     Status: None   Collection Time: 03/10/16  4:12 AM  Result Value Ref Range Status   Specimen Description BLOOD RIGHT HAND  Final   Special Requests BOTTLES DRAWN AEROBIC ONLY 5CC  Final   Culture NO GROWTH 5 DAYS  Final   Report Status 03/15/2016 FINAL  Final  Culture, blood (routine x 2)     Status: None   Collection Time: 03/10/16 11:55 AM  Result Value Ref Range Status   Specimen Description BLOOD RIGHT ARM  Final   Special Requests IN PEDIATRIC BOTTLE 1.5CC  Final   Culture  Setup Time   Final    GRAM POSITIVE COCCI IN CLUSTERS IN PEDIATRIC BOTTLE CRITICAL RESULT CALLED TO, READ BACK BY AND VERIFIED WITH: C. THOMAS,RN AT [id number] ON [id number] BY Lucienne Capers    Culture   Final    STAPHYLOCOCCUS SPECIES (COAGULASE NEGATIVE) THE SIGNIFICANCE OF ISOLATING THIS ORGANISM FROM A SINGLE SET OF BLOOD CULTURES WHEN MULTIPLE SETS ARE DRAWN IS UNCERTAIN. PLEASE NOTIFY THE MICROBIOLOGY DEPARTMENT WITHIN ONE WEEK IF SPECIATION AND SENSITIVITIES ARE REQUIRED.    Report Status 03/13/2016 FINAL  Final  Culture, blood (routine x 2)     Status: None   Collection Time: 03/10/16 11:59 AM  Result Value Ref Range Status   Specimen Description BLOOD RIGHT ARM  Final   Special Requests IN PEDIATRIC BOTTLE 1.5CC  Final   Culture NO GROWTH 5 DAYS  Final   Report Status 03/15/2016 FINAL  Final  MRSA PCR Screening     Status: Abnormal   Collection Time: 03/10/16 12:36 PM  Result Value Ref Range Status   MRSA by PCR POSITIVE (A) NEGATIVE Final    Comment:        The GeneXpert MRSA Assay (FDA approved for NASAL specimens only), is one component of a comprehensive MRSA colonization surveillance program. It is not intended to diagnose  MRSA infection nor to guide or monitor treatment for MRSA infections. RESULT CALLED TO, READ BACK BY AND VERIFIED WITH: L MOSS RN [id number] 03/10/16 A BROWNING   Culture, blood (Routine X 2) w Reflex to ID Panel     Status: None   Collection Time: 03/14/16  4:24 PM  Result Value Ref Range Status   Specimen Description BLOOD LEFT HAND  Final   Special Requests BOTTLES DRAWN AEROBIC ONLY 4CC  Final   Culture NO GROWTH 5 DAYS  Final   Report Status 03/19/2016 FINAL  Final  Culture, blood (Routine X 2) w Reflex to ID Panel     Status: None   Collection Time: 03/14/16  4:27 PM  Result Value Ref Range Status   Specimen Description BLOOD RIGHT HAND  Final   Special Requests BOTTLES DRAWN AEROBIC ONLY 5CC  Final   Culture  Setup Time   Final    AEROBIC BOTTLE ONLY GRAM POSITIVE COCCI IN CLUSTERS CRITICAL RESULT CALLED TO, READ BACK BY AND VERIFIED WITH: J.MCBRIDE,RN 0007 03/16/16 M.CAMPBELL    Culture STAPHYLOCOCCUS SPECIES (COAGULASE NEGATIVE)  Final   Report Status 03/18/2016 FINAL  Final   Organism ID, Bacteria STAPHYLOCOCCUS SPECIES (COAGULASE NEGATIVE)  Final      Susceptibility   Staphylococcus species (coagulase negative) - MIC*    CIPROFLOXACIN <=0.5 SENSITIVE Sensitive     ERYTHROMYCIN <=0.25 SENSITIVE Sensitive     GENTAMICIN <=0.5  SENSITIVE Sensitive     OXACILLIN <=0.25 SENSITIVE Sensitive     TETRACYCLINE <=1 SENSITIVE Sensitive     VANCOMYCIN 1 SENSITIVE Sensitive     TRIMETH/SULFA <=10 SENSITIVE Sensitive     CLINDAMYCIN <=0.25 SENSITIVE Sensitive     RIFAMPIN <=0.5 SENSITIVE Sensitive     Inducible Clindamycin NEGATIVE Sensitive     * STAPHYLOCOCCUS SPECIES (COAGULASE NEGATIVE)  Respiratory virus panel     Status: None   Collection Time: 03/14/16  8:42 PM  Result Value Ref Range Status   Source - [facility] NASAL SWAB  Corrected   Respiratory Syncytial Virus A Negative Negative Final   Respiratory Syncytial Virus B Negative Negative Final   Influenza A Negative Negative  Final   Influenza B Negative Negative Final   Parainfluenza 1 Negative Negative Final   Parainfluenza 2 Negative Negative Final   Parainfluenza 3 Negative Negative Final   Metapneumovirus Negative Negative Final   Rhinovirus Negative Negative Final   Adenovirus Negative Negative Final    Comment: (NOTE) Performed At: [facility] [address] [place], [place] [id number] Mila Homer MD WU:[id number]      Labs: Basic Metabolic Panel:  Recent Labs Lab 03/13/16 0541 03/14/16 0628 03/15/16 0626 03/17/16 0621  NA 136 135 134* 139  K 3.4* 3.7 3.7 4.1  CL 107 105 101 103  CO2 21* 21* 20* 24  GLUCOSE 107* 85 126* 107*  BUN <5* 7 <5* <5*  CREATININE 0.90 0.86 0.98 0.73  CALCIUM 8.7* 8.5* 8.5* 8.8*   Liver Function Tests:  Recent Labs Lab 03/15/16 0626  AST 22  ALT 23  ALKPHOS 64  BILITOT 1.9*  PROT 6.5  ALBUMIN 3.2*   No results for input(s): LIPASE, AMYLASE in the last 168 hours. No results for input(s): AMMONIA in the last 168 hours. CBC:  Recent Labs Lab 03/14/16 0628 03/15/16 0626 03/17/16 0621 03/18/16 0505 03/19/16 0552  WBC 6.6 12.7* 8.7 9.0 7.5  HGB 13.8 13.6 12.6* 13.6 12.2*  HCT 40.0 39.7 37.3* 39.2 35.3*  MCV 87.3 87.4 87.1 88.3 87.2  PLT 112* 145* 265 310 298   Cardiac Enzymes:  Recent Labs Lab 03/16/16 0931  CKTOTAL 28*   BNP: BNP (last 3 results) No results for input(s): BNP in the last 8760 hours.  ProBNP (last 3 results) No results for input(s): PROBNP in the last 8760 hours.  CBG: No results for input(s): GLUCAP in the last 168 hours.     Signed:  Eddie North MD.  [facility] 03/19/2016, 2:03 PM

## 2016-03-19 NOTE — Care Management Note (Signed)
Case Management Note  Patient Details  Name: Linard MillersRaymond Poage MRN: [id number] Date of Birth: 1981/09/17  Subjective/Objective:                    Action/Plan: MATCH letter given and explained . Patient aware pain medicine and Eliquis are not covered .   Gave and explained 30 free Eliquis Card to patient .  Gave patient application for Eliquis assistance . Told patient to complete application now so he can get approved before his 30 day supply runs out . Patient needs a follow up MD to completed a section of the application. Provided patient with information on [facility] , patient stated he knows exactly where that is . Explained Clinic will not allow case manager to schedule an appointment , he has to call .  Explained the importance of the Eliquis and follow up care.  Patient voiced understanding and stated he will schedule an appointment and completed application.  Patient voiced understanding of all of the above.   Expected Discharge Date:                  Expected Discharge Plan:  Home/Self Care  In-House Referral:     Discharge planning Services  CM Consult, [facility], Medication Assistance  Post Acute Care Choice:    Choice offered to:  Patient  DME Arranged:    DME Agency:     HH Arranged:    HH Agency:     Status of Service:  Completed, signed off  Medicare Important Message Given:    Date Medicare IM Given:    Medicare IM give by:    Date Additional Medicare IM Given:    Additional Medicare Important Message give by:     If discussed at Long Length of Stay Meetings, dates discussed:    Additional Comments:  [name], Angle Dirusso Marie, RN 03/19/2016, 10:57 AM

## 2016-03-20 ENCOUNTER — Ambulatory Visit (HOSPITAL_COMMUNITY): Payer: Self-pay

## 2016-03-20 LAB — URINALYSIS, ROUTINE W REFLEX MICROSCOPIC
Glucose, UA: NEGATIVE mg/dL
HGB URINE DIPSTICK: NEGATIVE
Ketones, ur: 15 mg/dL — AB
Leukocytes, UA: NEGATIVE
Nitrite: NEGATIVE
Protein, ur: NEGATIVE mg/dL
SPECIFIC GRAVITY, URINE: 1.025 (ref 1.005–1.030)
pH: 6 (ref 5.0–8.0)

## 2016-03-20 LAB — HEPATITIS C GENOTYPE

## 2016-03-20 LAB — I-STAT CG4 LACTIC ACID, ED: LACTIC ACID, VENOUS: 0.64 mmol/L (ref 0.5–2.0)

## 2016-03-20 MED ORDER — CLINDAMYCIN HCL 300 MG PO CAPS: 300.0000 mg | ORAL_CAPSULE | Freq: Three times a day (TID) | ORAL | Status: DC

## 2016-03-20 MED ORDER — IBUPROFEN 800 MG PO TABS: 800.0000 mg | ORAL_TABLET | Freq: Three times a day (TID) | ORAL | Status: DC | PRN

## 2016-03-20 MED ORDER — ONDANSETRON 4 MG PO TBDP
4.0000 mg | ORAL_TABLET | Freq: Three times a day (TID) | ORAL | Status: DC | PRN
Start: 2016-03-20 — End: 2016-11-17

## 2016-03-20 NOTE — Discharge Instructions (Signed)
Please return to the emergency department if the redness and warmth spreads outside of the lines that have been drawn on your arms, you begin vomiting and cannot stop, your arms feel tight or swollen, you have cold, blue or tingly fingertips.   Please take your blood thinners as prescribed for your right upper extremity DVT. We have ordered an ultrasound of your left upper external nature rule out a blood clot in this arm. Please return in the morning for this imaging.  Cellulitis Cellulitis is an infection of the skin and the tissue beneath it. The infected area is usually red and tender. Cellulitis occurs most often in the arms and lower legs.  CAUSES  Cellulitis is caused by bacteria that enter the skin through cracks or cuts in the skin. The most common types of bacteria that cause cellulitis are staphylococci and streptococci. SIGNS AND SYMPTOMS   Redness and warmth.  Swelling.  Tenderness or pain.  Fever. DIAGNOSIS  Your health care provider can usually determine what is wrong based on a physical exam. Blood tests may also be done. TREATMENT  Treatment usually involves taking an antibiotic medicine. HOME CARE INSTRUCTIONS   Take your antibiotic medicine as directed by your health care provider. Finish the antibiotic even if you start to feel better.  Keep the infected arm or leg elevated to reduce swelling.  Apply a warm cloth to the affected area up to 4 times per day to relieve pain.  Take medicines only as directed by your health care provider.  Keep all follow-up visits as directed by your health care provider. SEEK MEDICAL CARE IF:   You notice red streaks coming from the infected area.  Your red area gets larger or turns dark in color.  Your bone or joint underneath the infected area becomes painful after the skin has healed.  Your infection returns in the same area or another area.  You notice a swollen bump in the infected area.  You develop new  symptoms.  You have a fever. SEEK IMMEDIATE MEDICAL CARE IF:   You feel very sleepy.  You develop vomiting or diarrhea.  You have a general ill feeling (malaise) with muscle aches and pains.   This information is not intended to replace advice given to you by your health care provider. Make sure you discuss any questions you have with your health care provider.   Document Released: 09/09/2005 Document Revised: 08/21/2015 Document Reviewed: 02/15/2012 Elsevier Interactive Patient Education [employer]2016 Elsevier Inc.   To find a primary care or specialty doctor please call [phone] or [phone] to access "[place] Find a Doctor Service."  You may also go on the [facility] website at [email or web address]  There are also multiple [facility] practices throughout [place] that are frequently accepting new patients. You may find a clinic that is close to your home and contact them.  [facility] -  [address] [place] [phone]  [facility] in [place] (also locations in [place] and [place]) -  [address] [place] [id number] [phone]  [facility] -  [address] [place] [address] [phone]

## 2016-03-20 NOTE — ED Notes (Signed)
Patient able to ambulate independently  

## 2016-03-20 NOTE — ED Notes (Signed)
Patient made aware of need for urine and provided with urinal

## 2016-03-20 NOTE — ED Notes (Signed)
Patient reminded of need for urine 

## 2016-03-21 LAB — URINE CULTURE: Culture: NO GROWTH

## 2016-03-23 LAB — CRYOGLOBULIN

## 2016-03-25 LAB — CULTURE, BLOOD (ROUTINE X 2)
CULTURE: NO GROWTH
CULTURE: NO GROWTH

## 2016-09-02 ENCOUNTER — Encounter (HOSPITAL_COMMUNITY): Payer: Self-pay | Admitting: *Deleted

## 2016-09-02 ENCOUNTER — Observation Stay (HOSPITAL_COMMUNITY)
Admission: AD | Admit: 2016-09-02 | Payer: No Typology Code available for payment source | Source: Intra-hospital | Admitting: Psychiatry

## 2016-09-02 ENCOUNTER — Emergency Department (HOSPITAL_COMMUNITY): Payer: Self-pay

## 2016-09-02 ENCOUNTER — Emergency Department (HOSPITAL_COMMUNITY)
Admission: EM | Admit: 2016-09-02 | Discharge: 2016-09-02 | Disposition: A | Discharge status: Home / Self Care | Payer: Self-pay | Attending: Emergency Medicine | Admitting: Emergency Medicine

## 2016-09-02 DIAGNOSIS — R0789 Other chest pain: Secondary | ICD-10-CM | POA: Insufficient documentation

## 2016-09-02 DIAGNOSIS — R45851 Suicidal ideations: Secondary | ICD-10-CM | POA: Insufficient documentation

## 2016-09-02 DIAGNOSIS — F1410 Cocaine abuse, uncomplicated: Secondary | ICD-10-CM | POA: Insufficient documentation

## 2016-09-02 DIAGNOSIS — Z7901 Long term (current) use of anticoagulants: Secondary | ICD-10-CM | POA: Insufficient documentation

## 2016-09-02 DIAGNOSIS — F17210 Nicotine dependence, cigarettes, uncomplicated: Secondary | ICD-10-CM | POA: Insufficient documentation

## 2016-09-02 DIAGNOSIS — F32A Depression, unspecified: Secondary | ICD-10-CM

## 2016-09-02 DIAGNOSIS — F1910 Other psychoactive substance abuse, uncomplicated: Secondary | ICD-10-CM

## 2016-09-02 DIAGNOSIS — F329 Major depressive disorder, single episode, unspecified: Secondary | ICD-10-CM | POA: Insufficient documentation

## 2016-09-02 DIAGNOSIS — F1120 Opioid dependence, uncomplicated: Secondary | ICD-10-CM | POA: Insufficient documentation

## 2016-09-02 LAB — I-STAT TROPONIN, ED: TROPONIN I, POC: 0 ng/mL (ref 0.00–0.08)

## 2016-09-02 LAB — CBC
HEMATOCRIT: 38.3 % — AB (ref 39.0–52.0)
Hemoglobin: 13.3 g/dL (ref 13.0–17.0)
MCH: 30.2 pg (ref 26.0–34.0)
MCHC: 34.7 g/dL (ref 30.0–36.0)
MCV: 86.8 fL (ref 78.0–100.0)
Platelets: 180 10*3/uL (ref 150–400)
RBC: 4.41 MIL/uL (ref 4.22–5.81)
RDW: 12.6 % (ref 11.5–15.5)
WBC: 6.7 10*3/uL (ref 4.0–10.5)

## 2016-09-02 LAB — BASIC METABOLIC PANEL
Anion gap: 6 (ref 5–15)
BUN: 15 mg/dL (ref 6–20)
CO2: 23 mmol/L (ref 22–32)
CREATININE: 0.78 mg/dL (ref 0.61–1.24)
Calcium: 9.3 mg/dL (ref 8.9–10.3)
Chloride: 107 mmol/L (ref 101–111)
GFR calc Af Amer: >60 mL/min (ref >60)
Glucose, Bld: 104 mg/dL — ABNORMAL HIGH (ref 65–99)
Potassium: 3.6 mmol/L (ref 3.5–5.1)
SODIUM: 136 mmol/L (ref 135–145)

## 2016-09-02 LAB — TROPONIN I: Troponin I: <0.03 ng/mL (ref <0.03)

## 2016-09-02 LAB — ETHANOL: Alcohol, Ethyl (B): <5 mg/dL (ref <5)

## 2016-09-02 MED ORDER — METHADONE HCL 10 MG PO TABS
90.0000 mg | ORAL_TABLET | Freq: Every day | ORAL | Status: DC
  Administered 2016-09-02: 90 mg via ORAL
  Filled 2016-09-02: qty 9

## 2016-09-02 MED ORDER — LORAZEPAM 2 MG/ML IJ SOLN
0.5000 mg | Freq: Once | INTRAMUSCULAR | Status: AC
  Administered 2016-09-02: 0.5 mg via INTRAVENOUS
  Filled 2016-09-02: qty 1

## 2016-09-02 MED ORDER — NICOTINE 21 MG/24HR TD PT24: 21.0000 mg | MEDICATED_PATCH | Freq: Once | TRANSDERMAL | Status: DC

## 2016-09-02 MED ORDER — LORAZEPAM 1 MG PO TABS: 1.0000 mg | ORAL_TABLET | Freq: Once | ORAL | Status: DC

## 2016-09-02 MED ORDER — LORAZEPAM 2 MG/ML IJ SOLN: 1.0000 mg | Freq: Once | INTRAMUSCULAR | Status: DC

## 2016-09-02 MED ORDER — ASPIRIN 81 MG PO CHEW
324.0000 mg | CHEWABLE_TABLET | Freq: Once | ORAL | Status: AC
  Administered 2016-09-02: 324 mg via ORAL
  Filled 2016-09-02: qty 4

## 2016-09-02 MED ORDER — ONDANSETRON HCL 4 MG PO TABS: 4.0000 mg | ORAL_TABLET | Freq: Three times a day (TID) | ORAL | Status: DC | PRN

## 2016-09-02 MED ORDER — METHADONE HCL 10 MG/ML PO CONC: 90.0000 mg | Freq: Every day | ORAL | Status: DC

## 2016-09-02 MED ORDER — NICOTINE 21 MG/24HR TD PT24: 21.0000 mg | MEDICATED_PATCH | Freq: Every day | TRANSDERMAL | Status: DC

## 2016-09-02 MED ORDER — ALUM & MAG HYDROXIDE-SIMETH 200-200-20 MG/5ML PO SUSP: 30.0000 mL | ORAL | Status: DC | PRN

## 2016-09-02 MED ORDER — LORAZEPAM 1 MG PO TABS: 1.0000 mg | ORAL_TABLET | Freq: Three times a day (TID) | ORAL | Status: DC | PRN

## 2016-09-02 NOTE — ED Notes (Signed)
[facility] to [facility] about [facility].
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
## 2016-09-02 NOTE — BH Assessment (Signed)
Per Renata Capriceonrad, DNP - patient meets criteria for the OBS Unit.  [facility] Are[facility](Eric) patient accepted to the OBS Unit.

## 2016-09-02 NOTE — ED Provider Notes (Signed)
Pt visit shared.  Patient here following cocaine chest pain, medically cleared in terms of his chest pain. He did have transient suicidal thoughts while in the emergency department because he is wanting treatment for his substance abuse. He has been evaluated by behavioral health and observation bed was made available to him. Patient declines observation admission and requests to go home. He feels safe at home. Plan to DC home with outpatient detox resources.   Tilden FossaElizabeth Nithya Meriweather, MD 09/02/16 1246

## 2016-09-02 NOTE — ED Provider Notes (Signed)
Patient presented to the ER with chest pain and cocaine abuse. Patient has a long-standing history of cocaine abuse. EKG shows tachycardia, but no signs of ischemia or infarct. Patient monitored for an extended period of time, multiple troponins negative.  Face to face Exam: HEENT - PERRLA Lungs - CTAB Heart - RRR, no M/R/G Abd - S/NT/ND Neuro - alert, oriented x3  Plan: Pot attempted to discharge the patient, patient became tearful and reported that he is extremely depressed. He reported that if he did not get help he would kill himself. He has a plan to cut his throat with a knife. Patient will therefore undergo psychiatric evaluation. Patient is medically clear for psych eval.   Gilda Creasehristopher J Omar Orrego, MD 09/02/16 [phone]

## 2016-09-02 NOTE — ED Notes (Signed)
Pt changed into scrubs, wanted by security and belongings placed at desk

## 2016-09-02 NOTE — ED Notes (Signed)
TTS assessing at this time. 

## 2016-09-02 NOTE — ED Triage Notes (Signed)
Pt c/o chest pain after doing cocaine around 1830 tonight. Pt states he is on methadone, pt has not taken his methadone in two days due to lack of money. Pt did a shot of heroin yesterday.

## 2016-09-02 NOTE — ED Notes (Signed)
TTS machine at bedside. 

## 2016-09-02 NOTE — ED Notes (Signed)
Pt. Asking to leave. Pt. Became tearful when asked about wanting to hurt himself and the conversation he had with another nurse about going into the parking lot and slitting his throat. Pt. Reports he only said those things because he was upset and hasn't had his methadone in 3 days. EDP made aware.

## 2016-09-02 NOTE — ED Notes (Signed)
All patient belongings returned to patient at this time.

## 2016-09-02 NOTE — ED Notes (Signed)
Pt. Accepted to OBS unit. Treatment options discussed with patient at this time. Pt. Chose to be discharged and is upset because he 'needs a cigarette'.

## 2016-09-16 ENCOUNTER — Emergency Department (HOSPITAL_COMMUNITY)
Admission: EM | Admit: 2016-09-16 | Discharge: 2016-09-17 | Discharge status: Against Medical Advice | Payer: Self-pay | Attending: Emergency Medicine | Admitting: Emergency Medicine

## 2016-09-16 ENCOUNTER — Encounter (HOSPITAL_COMMUNITY): Payer: Self-pay

## 2016-09-16 DIAGNOSIS — F17210 Nicotine dependence, cigarettes, uncomplicated: Secondary | ICD-10-CM | POA: Insufficient documentation

## 2016-09-16 DIAGNOSIS — Z7901 Long term (current) use of anticoagulants: Secondary | ICD-10-CM | POA: Insufficient documentation

## 2016-09-16 DIAGNOSIS — M79601 Pain in right arm: Secondary | ICD-10-CM | POA: Insufficient documentation
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
## 2016-09-16 NOTE — ED Triage Notes (Signed)
Per EMS, pt homeless.  Pt c/o rt arm pain x 2 days ago.  Pt states last night he had fever and chills.  Pt has hx of MRSA in same arm, however denies abscess.  Hx of IV drug use.  Painful to palpation.  Vitals 124/92, hr 62, resp 16, 99% ra

## 2016-09-30 ENCOUNTER — Emergency Department (HOSPITAL_COMMUNITY)
Admission: EM | Admit: 2016-09-30 | Discharge: 2016-09-30 | Disposition: A | Discharge status: Home / Self Care | Payer: No Typology Code available for payment source | Attending: Physician Assistant | Admitting: Physician Assistant

## 2016-09-30 ENCOUNTER — Emergency Department (HOSPITAL_COMMUNITY): Payer: No Typology Code available for payment source

## 2016-09-30 ENCOUNTER — Encounter (HOSPITAL_COMMUNITY): Payer: Self-pay | Admitting: Emergency Medicine

## 2016-09-30 ENCOUNTER — Encounter (HOSPITAL_COMMUNITY): Payer: Self-pay

## 2016-09-30 DIAGNOSIS — F17200 Nicotine dependence, unspecified, uncomplicated: Secondary | ICD-10-CM | POA: Insufficient documentation

## 2016-09-30 DIAGNOSIS — S20211A Contusion of right front wall of thorax, initial encounter: Secondary | ICD-10-CM | POA: Diagnosis not present

## 2016-09-30 DIAGNOSIS — Y999 Unspecified external cause status: Secondary | ICD-10-CM | POA: Insufficient documentation

## 2016-09-30 DIAGNOSIS — M546 Pain in thoracic spine: Secondary | ICD-10-CM | POA: Diagnosis not present

## 2016-09-30 DIAGNOSIS — R51 Headache: Secondary | ICD-10-CM | POA: Diagnosis not present

## 2016-09-30 DIAGNOSIS — Y92410 Unspecified street and highway as the place of occurrence of the external cause: Secondary | ICD-10-CM | POA: Insufficient documentation

## 2016-09-30 DIAGNOSIS — R1030 Lower abdominal pain, unspecified: Secondary | ICD-10-CM | POA: Diagnosis not present

## 2016-09-30 DIAGNOSIS — Y939 Activity, unspecified: Secondary | ICD-10-CM | POA: Diagnosis not present

## 2016-09-30 DIAGNOSIS — S299XXA Unspecified injury of thorax, initial encounter: Secondary | ICD-10-CM | POA: Diagnosis present

## 2016-09-30 DIAGNOSIS — T1490XA Injury, unspecified, initial encounter: Secondary | ICD-10-CM

## 2016-09-30 LAB — URINALYSIS, ROUTINE W REFLEX MICROSCOPIC
Bilirubin Urine: NEGATIVE
GLUCOSE, UA: NEGATIVE mg/dL
Hgb urine dipstick: NEGATIVE
KETONES UR: 15 mg/dL — AB
LEUKOCYTES UA: NEGATIVE
NITRITE: NEGATIVE
PROTEIN: NEGATIVE mg/dL
Specific Gravity, Urine: 1.008 (ref 1.005–1.030)
pH: 8 (ref 5.0–8.0)

## 2016-09-30 LAB — PROTIME-INR
INR: 1.07
Prothrombin Time: 14 seconds (ref 11.4–15.2)

## 2016-09-30 LAB — SAMPLE TO BLOOD BANK

## 2016-09-30 LAB — ETHANOL

## 2016-09-30 LAB — CBC
HCT: 38.1 % — ABNORMAL LOW (ref 39.0–52.0)
Hemoglobin: 13.3 g/dL (ref 13.0–17.0)
MCH: 30 pg (ref 26.0–34.0)
MCHC: 34.9 g/dL (ref 30.0–36.0)
MCV: 85.8 fL (ref 78.0–100.0)
PLATELETS: 219 10*3/uL (ref 150–400)
RBC: 4.44 MIL/uL (ref 4.22–5.81)
RDW: 12.7 % (ref 11.5–15.5)
WBC: 4.9 10*3/uL (ref 4.0–10.5)

## 2016-09-30 LAB — I-STAT CG4 LACTIC ACID, ED: Lactic Acid, Venous: 1.96 mmol/L (ref 0.5–1.9)

## 2016-09-30 LAB — COMPREHENSIVE METABOLIC PANEL
ALK PHOS: 65 U/L (ref 38–126)
ALT: 18 U/L (ref 17–63)
AST: 18 U/L (ref 15–41)
Albumin: 3.7 g/dL (ref 3.5–5.0)
Anion gap: 9 (ref 5–15)
BUN: <5 mg/dL — ABNORMAL LOW (ref 6–20)
CALCIUM: 9.4 mg/dL (ref 8.9–10.3)
CO2: 23 mmol/L (ref 22–32)
CREATININE: 0.72 mg/dL (ref 0.61–1.24)
Chloride: 109 mmol/L (ref 101–111)
Glucose, Bld: 114 mg/dL — ABNORMAL HIGH (ref 65–99)
Potassium: 2.9 mmol/L — ABNORMAL LOW (ref 3.5–5.1)
Sodium: 141 mmol/L (ref 135–145)
TOTAL PROTEIN: 6.8 g/dL (ref 6.5–8.1)
Total Bilirubin: 1.1 mg/dL (ref 0.3–1.2)

## 2016-09-30 LAB — I-STAT CHEM 8, ED
BUN: 9 mg/dL (ref 6–20)
CALCIUM ION: 1.03 mmol/L — AB (ref 1.15–1.40)
CHLORIDE: 103 mmol/L (ref 101–111)
CREATININE: 0.6 mg/dL — AB (ref 0.61–1.24)
GLUCOSE: 107 mg/dL — AB (ref 65–99)
HCT: 39 % (ref 39.0–52.0)
Hemoglobin: 13.3 g/dL (ref 13.0–17.0)
POTASSIUM: 3.5 mmol/L (ref 3.5–5.1)
Sodium: 141 mmol/L (ref 135–145)
TCO2: 23 mmol/L (ref 0–100)

## 2016-09-30 LAB — CDS SEROLOGY

## 2016-09-30 MED ORDER — ONDANSETRON HCL 4 MG/2ML IJ SOLN
4.0000 mg | Freq: Once | INTRAMUSCULAR | Status: AC
  Administered 2016-09-30: 4 mg via INTRAVENOUS
  Filled 2016-09-30: qty 2

## 2016-09-30 MED ORDER — METHOCARBAMOL 500 MG PO TABS: 500.0000 mg | ORAL_TABLET | Freq: Four times a day (QID) | ORAL | 0 refills | Status: DC

## 2016-09-30 MED ORDER — HYDROMORPHONE HCL 2 MG/ML IJ SOLN: 0.5000 mg | Freq: Once | INTRAMUSCULAR | Status: DC

## 2016-09-30 MED ORDER — IOPAMIDOL (ISOVUE-300) INJECTION 61%
INTRAVENOUS | Status: AC
  Administered 2016-09-30: 100 mL
  Filled 2016-09-30: qty 100

## 2016-09-30 MED ORDER — OXYCODONE-ACETAMINOPHEN 5-325 MG PO TABS: 1.0000 | ORAL_TABLET | Freq: Once | ORAL | Status: DC

## 2016-09-30 MED ORDER — HYDROMORPHONE HCL 1 MG/ML IJ SOLN
INTRAMUSCULAR | Status: DC | PRN
  Administered 2016-09-30: 1 mg via INTRAVENOUS

## 2016-09-30 MED ORDER — HYDROMORPHONE HCL 2 MG/ML IJ SOLN
1.0000 mg | Freq: Once | INTRAMUSCULAR | Status: DC
  Filled 2016-09-30: qty 1

## 2016-09-30 MED ORDER — OXYCODONE HCL 5 MG PO TABS
5.0000 mg | ORAL_TABLET | Freq: Once | ORAL | Status: AC
Start: 2016-09-30 — End: 2016-09-30
  Administered 2016-09-30: 5 mg via ORAL
  Filled 2016-09-30: qty 1
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
## 2016-09-30 NOTE — ED Notes (Signed)
Patient undressed (all clothes were cut by [employer]), placed in gown, on monitor, continuous pulse oximetry and blood pressure cuff; warm blankets given; patient's cut clothes placed in personal belongings bag

## 2016-09-30 NOTE — ED Notes (Signed)
Pt. Informed that will receive additional pain medications after CT completed. Pt. Requesting something to drink, notified PA and patient informed that he can not have anything PO until CT result. Pt. Stating he will not go to CT until he receives pain relief. PA made aware and at bedside.
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
## 2016-09-30 NOTE — ED Notes (Addendum)
[place], RN aware of IV infiltrating in CT

## 2016-09-30 NOTE — ED Notes (Signed)
Patient transported to CT 
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
## 2016-09-30 NOTE — ED Notes (Signed)
Pt returned from CT scan , iv infiltrated , was able to get scan of head and neck , Md notified

## 2016-10-09 ENCOUNTER — Emergency Department (HOSPITAL_COMMUNITY)
Admission: EM | Admit: 2016-10-09 | Discharge: 2016-10-10 | Disposition: A | Discharge status: Home / Self Care | Payer: Self-pay | Attending: Emergency Medicine | Admitting: Emergency Medicine

## 2016-10-09 ENCOUNTER — Encounter (HOSPITAL_COMMUNITY): Payer: Self-pay

## 2016-10-09 ENCOUNTER — Emergency Department (HOSPITAL_COMMUNITY): Payer: Self-pay

## 2016-10-09 DIAGNOSIS — Y939 Activity, unspecified: Secondary | ICD-10-CM | POA: Insufficient documentation

## 2016-10-09 DIAGNOSIS — R52 Pain, unspecified: Secondary | ICD-10-CM

## 2016-10-09 DIAGNOSIS — W19XXXA Unspecified fall, initial encounter: Secondary | ICD-10-CM

## 2016-10-09 DIAGNOSIS — S59901A Unspecified injury of right elbow, initial encounter: Secondary | ICD-10-CM | POA: Insufficient documentation

## 2016-10-09 DIAGNOSIS — Y999 Unspecified external cause status: Secondary | ICD-10-CM | POA: Insufficient documentation

## 2016-10-09 DIAGNOSIS — F17210 Nicotine dependence, cigarettes, uncomplicated: Secondary | ICD-10-CM | POA: Insufficient documentation

## 2016-10-09 DIAGNOSIS — Y92149 Unspecified place in prison as the place of occurrence of the external cause: Secondary | ICD-10-CM | POA: Insufficient documentation

## 2016-10-09 DIAGNOSIS — M79601 Pain in right arm: Secondary | ICD-10-CM

## 2016-10-09 MED ORDER — HYDROMORPHONE HCL 2 MG/ML IJ SOLN
1.0000 mg | Freq: Once | INTRAMUSCULAR | Status: AC
  Administered 2016-10-09: 1 mg via INTRAVENOUS
  Filled 2016-10-09: qty 1

## 2016-10-09 MED ORDER — PROPOFOL 10 MG/ML IV BOLUS
0.5000 mg/kg | Freq: Once | INTRAVENOUS | Status: DC
  Filled 2016-10-09: qty 20
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
## 2016-10-10 ENCOUNTER — Emergency Department (HOSPITAL_COMMUNITY): Payer: Self-pay

## 2016-10-10 MED ORDER — IBUPROFEN 800 MG PO TABS: 800.0000 mg | ORAL_TABLET | Freq: Three times a day (TID) | ORAL | 0 refills | Status: DC | PRN

## 2016-10-10 MED ORDER — TRAMADOL HCL 50 MG PO TABS
50.0000 mg | ORAL_TABLET | Freq: Once | ORAL | Status: AC
  Administered 2016-10-10: 50 mg via ORAL
  Filled 2016-10-10: qty 1

## 2016-10-10 MED ORDER — PROPOFOL 10 MG/ML IV BOLUS
INTRAVENOUS | Status: AC | PRN
  Administered 2016-10-09: 40 mg via INTRAVENOUS
  Administered 2016-10-10: 20 mg via INTRAVENOUS

## 2016-10-10 MED ORDER — HYDROMORPHONE HCL 2 MG/ML IJ SOLN
0.5000 mg | Freq: Once | INTRAMUSCULAR | Status: AC
Start: 2016-10-10 — End: 2016-10-10
  Administered 2016-10-10: 0.5 mg via INTRAVENOUS
  Filled 2016-10-10: qty 1

## 2016-10-10 NOTE — Progress Notes (Signed)
RT bedside for conscious sedation. ETCO2 reading is 36-39. MD at bedside. No complications noted.

## 2016-10-10 NOTE — ED Notes (Signed)
Pt stable, understands discharge instructions, [facility]

## 2016-10-10 NOTE — ED Provider Notes (Signed)
Patient initially seen by Dr. Anitra LauthPlunkett for evaluation of right arm injury after fall, pending lateral view elbow x-ray to insure a non-fracture injury.   Patient continues to complain of pain. He would not allow movement of the arm on primary exam and required sedation to fully examine the joint, which was found to move in a full range, without deformity, crepitance.   Full view film unremarkable. Discussed findings with patient and accompanying officer and plan to discharge with ice, ibuprofen and prn orthopedic follow up was agreed.   Carl AnisShari Patsye Sullivant, PA-C 10/10/16 [id number]    Gwyneth SproutWhitney Plunkett, MD 10/12/16 [phone]

## 2016-11-17 ENCOUNTER — Emergency Department (HOSPITAL_COMMUNITY)
Admission: EM | Admit: 2016-11-17 | Discharge: 2016-11-17 | Disposition: A | Discharge status: Home / Self Care | Payer: Self-pay | Attending: Emergency Medicine | Admitting: Emergency Medicine

## 2016-11-17 ENCOUNTER — Emergency Department (HOSPITAL_COMMUNITY): Payer: Self-pay

## 2016-11-17 ENCOUNTER — Encounter (HOSPITAL_COMMUNITY): Payer: Self-pay | Admitting: *Deleted

## 2016-11-17 DIAGNOSIS — Z87891 Personal history of nicotine dependence: Secondary | ICD-10-CM | POA: Insufficient documentation

## 2016-11-17 DIAGNOSIS — W1789XA Other fall from one level to another, initial encounter: Secondary | ICD-10-CM | POA: Insufficient documentation

## 2016-11-17 DIAGNOSIS — Z79899 Other long term (current) drug therapy: Secondary | ICD-10-CM | POA: Insufficient documentation

## 2016-11-17 DIAGNOSIS — Y939 Activity, unspecified: Secondary | ICD-10-CM | POA: Insufficient documentation

## 2016-11-17 DIAGNOSIS — Y999 Unspecified external cause status: Secondary | ICD-10-CM | POA: Insufficient documentation

## 2016-11-17 DIAGNOSIS — Y929 Unspecified place or not applicable: Secondary | ICD-10-CM | POA: Insufficient documentation

## 2016-11-17 DIAGNOSIS — S5002XA Contusion of left elbow, initial encounter: Secondary | ICD-10-CM

## 2016-11-17 NOTE — ED Triage Notes (Addendum)
Pt fell off of top bunk and landed on left arm. Pt unable to bend arm at elbow. Pulses strong to left arm. Pt reports he hit his head also but no LOC.

## 2016-11-17 NOTE — ED Notes (Signed)
Pt states understanding of care given and follow up instructions.  Ambulated from ED in police custody
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
## 2016-11-17 NOTE — ED Notes (Signed)
Patient transported to CT 

## 2017-02-23 ENCOUNTER — Emergency Department (HOSPITAL_COMMUNITY): Payer: Self-pay

## 2017-02-23 ENCOUNTER — Encounter (HOSPITAL_COMMUNITY): Payer: Self-pay | Admitting: Emergency Medicine

## 2017-02-23 ENCOUNTER — Emergency Department (HOSPITAL_COMMUNITY)
Admission: EM | Admit: 2017-02-23 | Discharge: 2017-02-23 | Disposition: A | Discharge status: Home / Self Care | Payer: Self-pay | Attending: Emergency Medicine | Admitting: Emergency Medicine

## 2017-02-23 DIAGNOSIS — Y929 Unspecified place or not applicable: Secondary | ICD-10-CM | POA: Insufficient documentation

## 2017-02-23 DIAGNOSIS — W228XXA Striking against or struck by other objects, initial encounter: Secondary | ICD-10-CM | POA: Insufficient documentation

## 2017-02-23 DIAGNOSIS — M5431 Sciatica, right side: Secondary | ICD-10-CM | POA: Insufficient documentation

## 2017-02-23 DIAGNOSIS — Y999 Unspecified external cause status: Secondary | ICD-10-CM | POA: Insufficient documentation

## 2017-02-23 DIAGNOSIS — S20222A Contusion of left back wall of thorax, initial encounter: Secondary | ICD-10-CM | POA: Insufficient documentation

## 2017-02-23 DIAGNOSIS — Y939 Activity, unspecified: Secondary | ICD-10-CM | POA: Insufficient documentation

## 2017-02-23 DIAGNOSIS — M546 Pain in thoracic spine: Secondary | ICD-10-CM

## 2017-02-23 DIAGNOSIS — Z87891 Personal history of nicotine dependence: Secondary | ICD-10-CM | POA: Insufficient documentation

## 2017-02-23 DIAGNOSIS — R918 Other nonspecific abnormal finding of lung field: Secondary | ICD-10-CM | POA: Insufficient documentation

## 2017-02-23 DIAGNOSIS — Z79899 Other long term (current) drug therapy: Secondary | ICD-10-CM | POA: Insufficient documentation

## 2017-02-23 MED ORDER — PREDNISONE 20 MG PO TABS: 40.0000 mg | ORAL_TABLET | Freq: Every day | ORAL | 0 refills | Status: DC

## 2017-02-23 MED ORDER — OXYCODONE HCL 5 MG PO TABS
5.0000 mg | ORAL_TABLET | Freq: Once | ORAL | Status: AC
  Administered 2017-02-23: 5 mg via ORAL
  Filled 2017-02-23: qty 1
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
## 2017-02-23 NOTE — ED Notes (Signed)
Bed: WTR9 Expected date:  Expected time:  Means of arrival:  Comments: EMS-back pain-triage
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
## 2017-02-23 NOTE — ED Notes (Signed)
[facility]

## 2018-06-12 IMAGING — DX DG CHEST 2V
2 series · 2 of 2 positions shown · non-contrast
Comparison: 03/19/2016

CLINICAL DATA: Pt c/o chest pain after doing cocaine around 1404
tonight. Pt states he is on methadone, pt has not taken his
methadone in two days due to lack of money. Pt did a shot of heroin
yesterday.

EXAM:
CHEST  2 VIEW

[chest pa]
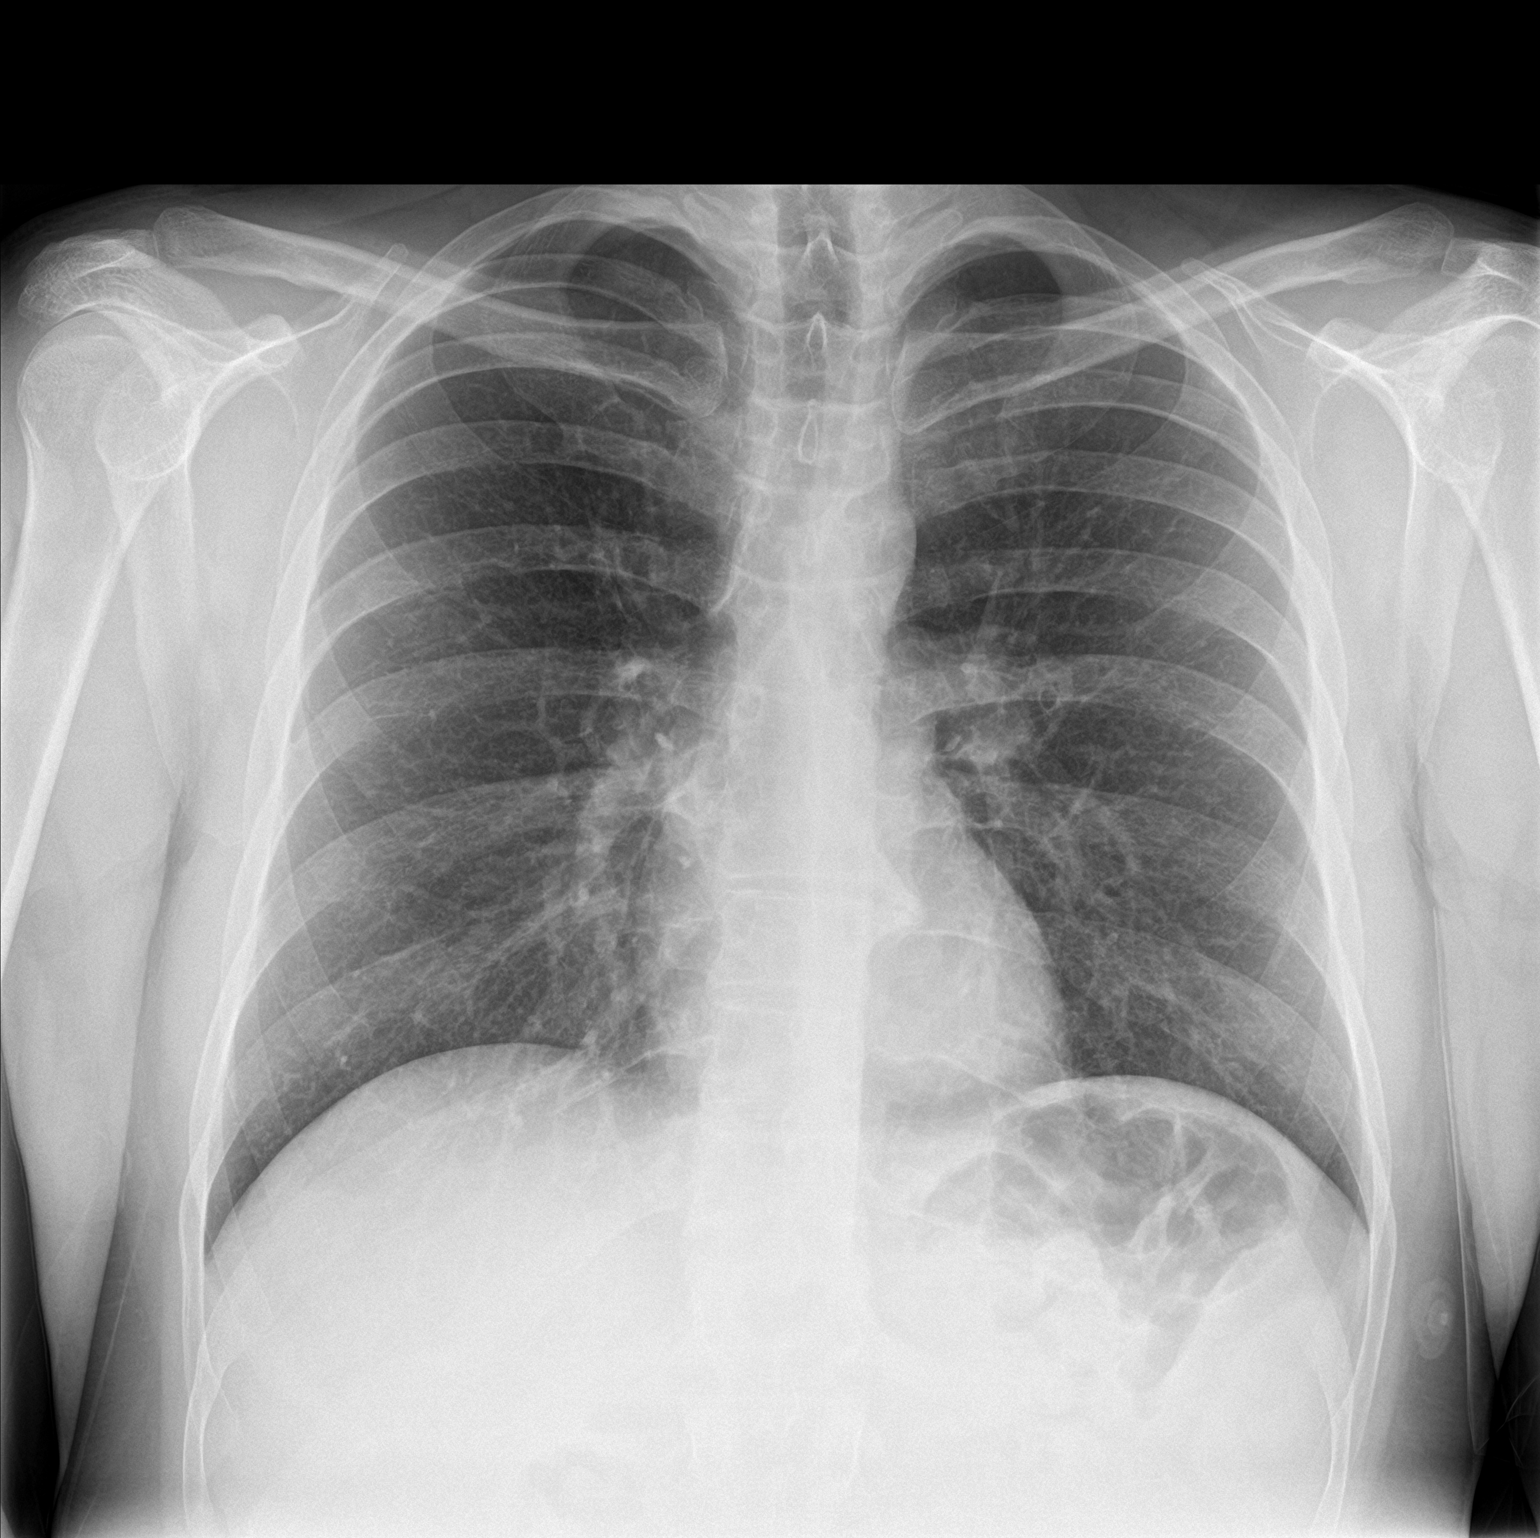

[chest lat]
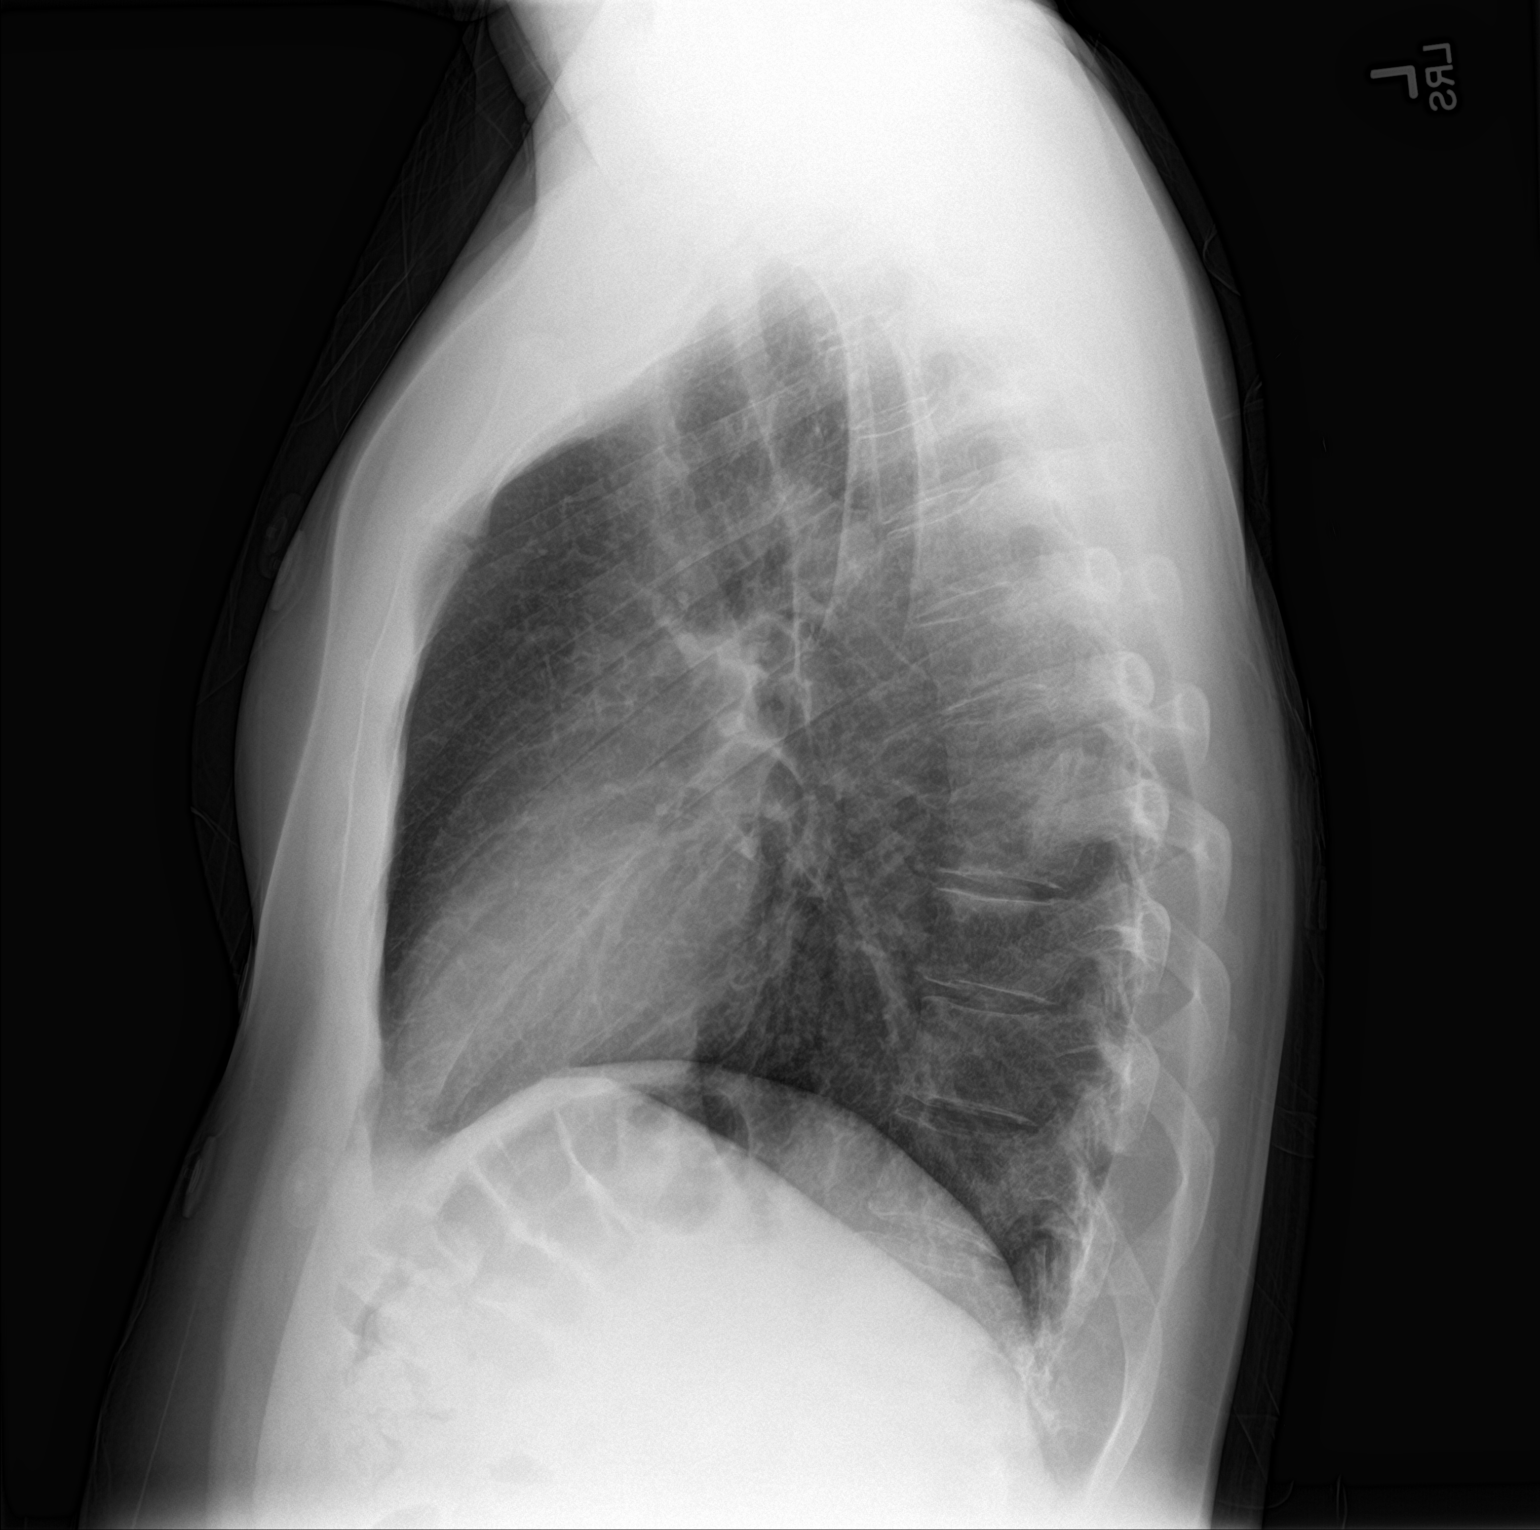

[2 of 2 positions shown; findings below may reference images not displayed]

FINDINGS: Normal heart size and pulmonary vascularity. No focal airspace
disease or consolidation in the lungs. No blunting of costophrenic
angles. No pneumothorax. Mediastinal contours appear intact.
Coalition of mid thoracic vertebrae, likely congenital.
IMPRESSION: No active cardiopulmonary disease.

## 2018-07-20 IMAGING — CR DG ELBOW 2V*R*
3 series · 3 of 3 positions shown · non-contrast
Comparison: 10/09/2016 no fracture or dislocation.

CLINICAL DATA: Fall onto right arm today.  Elbow pain

EXAM:
RIGHT ELBOW - 2 VIEW

[elbow ap]
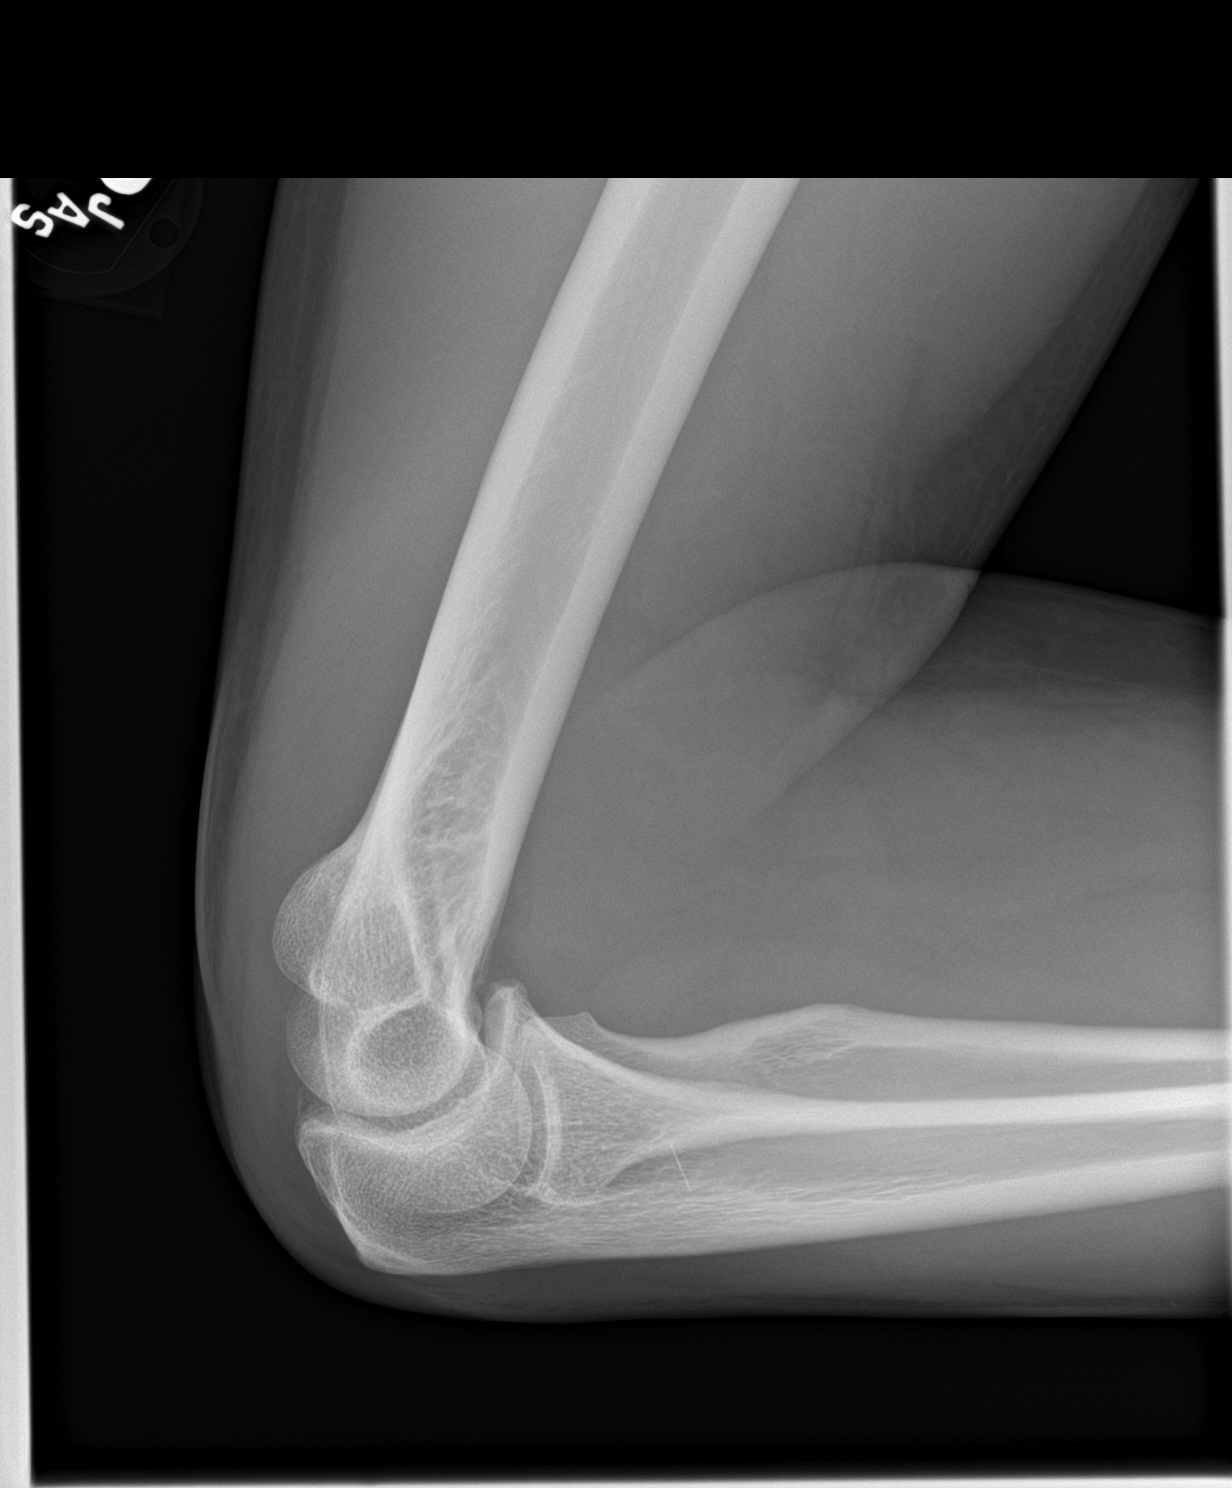

[elbow obl (1 of 2)]
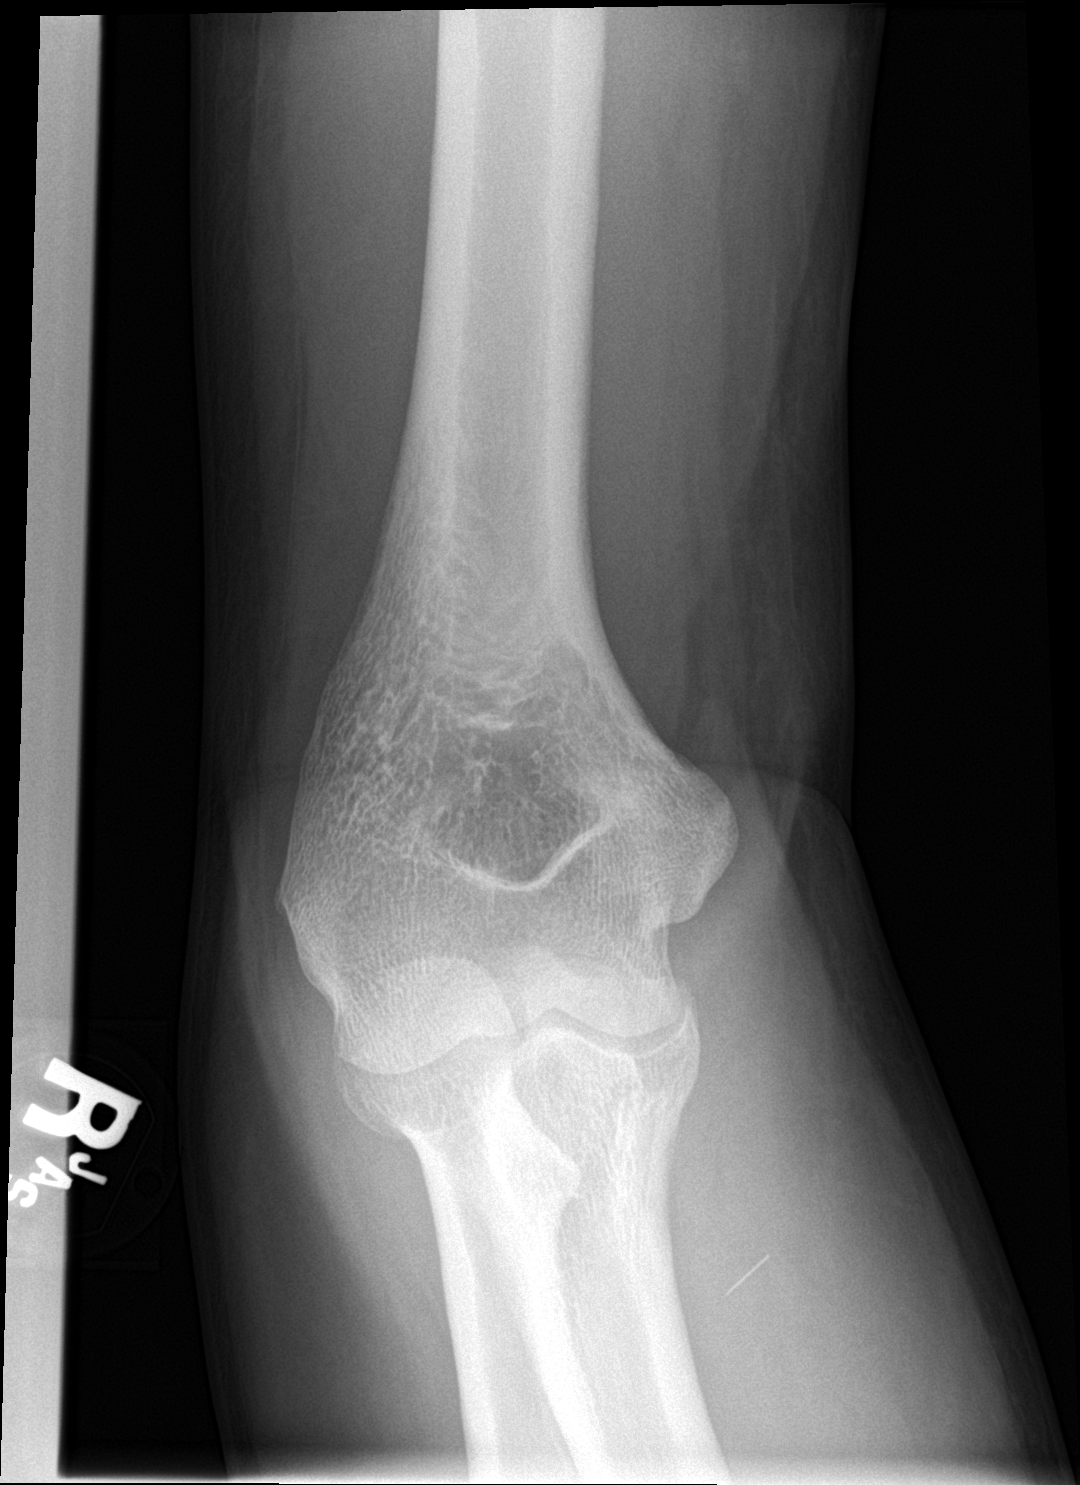

[elbow obl (2 of 2)]
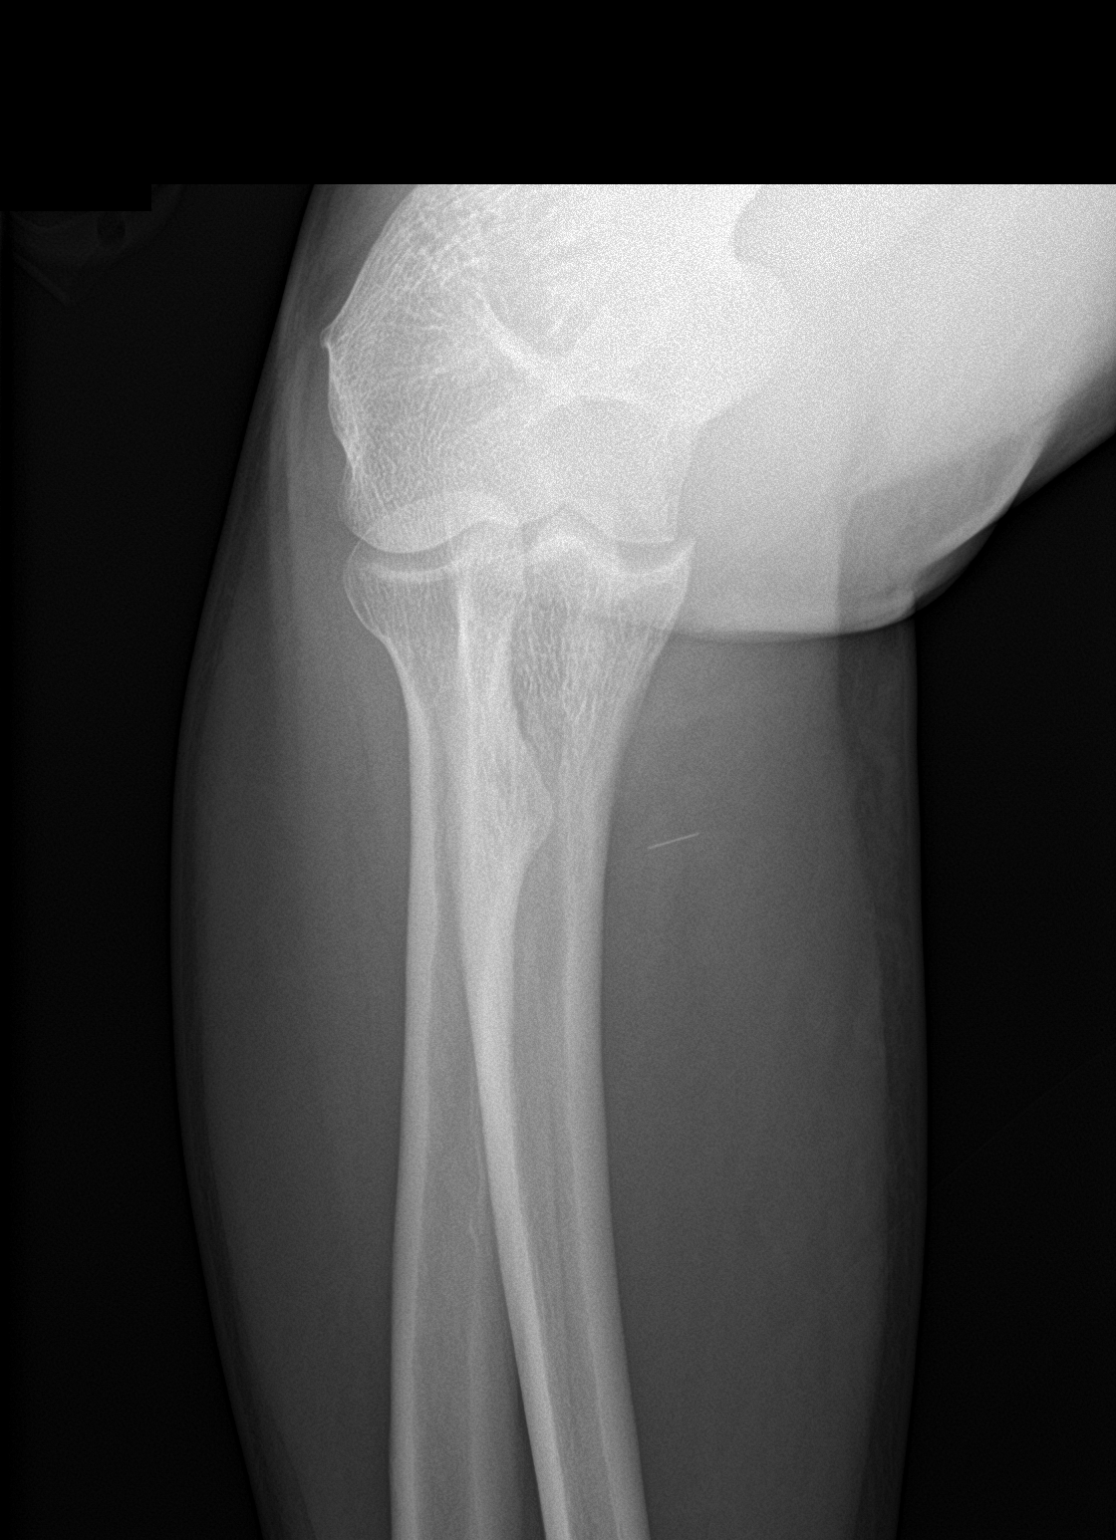

[3 of 3 positions shown; findings below may reference images not displayed]

No significant
elbow effusion. Possible 7 mm linear foreign body along the ulnar
aspect of the proximal forearm.
FINDINGS: There is no evidence of fracture, dislocation, or joint effusion.
There is no evidence of arthropathy or other focal bone abnormality.
Soft tissues are unremarkable.
IMPRESSION: 1. No definite acute osseous abnormality.
2. Linear radiopacity along the ulnar aspect of the proximal
forearm, cannot rule out foreign body.

## 2019-11-28 ENCOUNTER — Encounter (HOSPITAL_BASED_OUTPATIENT_CLINIC_OR_DEPARTMENT_OTHER): Payer: Self-pay | Admitting: Emergency Medicine

## 2019-11-28 ENCOUNTER — Emergency Department (HOSPITAL_BASED_OUTPATIENT_CLINIC_OR_DEPARTMENT_OTHER)
Admission: EM | Admit: 2019-11-28 | Discharge: 2019-11-28 | Disposition: A | Discharge status: Home / Self Care | Payer: Self-pay | Attending: Emergency Medicine | Admitting: Emergency Medicine

## 2019-11-28 ENCOUNTER — Other Ambulatory Visit: Payer: Self-pay

## 2019-11-28 DIAGNOSIS — Z79899 Other long term (current) drug therapy: Secondary | ICD-10-CM | POA: Insufficient documentation

## 2019-11-28 DIAGNOSIS — Z885 Allergy status to narcotic agent status: Secondary | ICD-10-CM | POA: Insufficient documentation

## 2019-11-28 DIAGNOSIS — L03311 Cellulitis of abdominal wall: Secondary | ICD-10-CM | POA: Insufficient documentation

## 2019-11-28 DIAGNOSIS — Z880 Allergy status to penicillin: Secondary | ICD-10-CM | POA: Insufficient documentation

## 2019-11-28 DIAGNOSIS — Z882 Allergy status to sulfonamides status: Secondary | ICD-10-CM | POA: Insufficient documentation

## 2019-11-28 DIAGNOSIS — F17210 Nicotine dependence, cigarettes, uncomplicated: Secondary | ICD-10-CM | POA: Insufficient documentation

## 2019-11-28 DIAGNOSIS — Z888 Allergy status to other drugs, medicaments and biological substances status: Secondary | ICD-10-CM | POA: Insufficient documentation

## 2019-11-28 MED ORDER — DOXYCYCLINE HYCLATE 100 MG PO CAPS: 100.0000 mg | ORAL_CAPSULE | Freq: Two times a day (BID) | ORAL | 0 refills | Status: DC

## 2019-11-28 MED ORDER — DOXYCYCLINE HYCLATE 100 MG PO TABS
100.0000 mg | ORAL_TABLET | Freq: Once | ORAL | Status: AC
  Administered 2019-11-28: 100 mg via ORAL
  Filled 2019-11-28: qty 1

## 2019-11-28 NOTE — Discharge Instructions (Signed)
Take the antibiotic as directed.  Work note provided to be out of work the next 2 days to allow it to heal.  Return for any worsening of redness or any worse symptoms at all.  Appears that there is development of some skin infection.  The antibiotic should help with that over the next several days.
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
## 2019-11-28 NOTE — ED Triage Notes (Signed)
Pt states he was at a job on Friday and he was carrying of metal duct work and ran into a column  Pt metal hit him in abdomen and broke the skin  Pt states today it looks like it may be getting infected and is more painful  Tender to touch   Pt has a hole in his abdomen below his umbilicus  Skin around the area is warm to touch and red

## 2021-02-11 ENCOUNTER — Emergency Department (HOSPITAL_COMMUNITY): Payer: Self-pay

## 2021-02-11 ENCOUNTER — Encounter (HOSPITAL_COMMUNITY): Payer: Self-pay | Admitting: *Deleted

## 2021-02-11 ENCOUNTER — Other Ambulatory Visit: Payer: Self-pay

## 2021-02-11 ENCOUNTER — Emergency Department (HOSPITAL_COMMUNITY)
Admission: EM | Admit: 2021-02-11 | Discharge: 2021-02-11 | Disposition: A | Discharge status: Home / Self Care | Payer: Self-pay | Attending: Emergency Medicine | Admitting: Emergency Medicine

## 2021-02-11 DIAGNOSIS — F17210 Nicotine dependence, cigarettes, uncomplicated: Secondary | ICD-10-CM | POA: Insufficient documentation

## 2021-02-11 DIAGNOSIS — K529 Noninfective gastroenteritis and colitis, unspecified: Secondary | ICD-10-CM | POA: Insufficient documentation

## 2021-02-11 DIAGNOSIS — I10 Essential (primary) hypertension: Secondary | ICD-10-CM | POA: Insufficient documentation

## 2021-02-11 DIAGNOSIS — Z79899 Other long term (current) drug therapy: Secondary | ICD-10-CM | POA: Insufficient documentation

## 2021-02-11 DIAGNOSIS — Z20822 Contact with and (suspected) exposure to covid-19: Secondary | ICD-10-CM | POA: Insufficient documentation

## 2021-02-11 HISTORY — DX: Essential (primary) hypertension: I10

## 2021-02-11 LAB — CBC WITH DIFFERENTIAL/PLATELET
Abs Immature Granulocytes: 0.01 10*3/uL (ref 0.00–0.07)
Basophils Absolute: 0 10*3/uL (ref 0.0–0.1)
Basophils Relative: 1 %
Eosinophils Absolute: 0.3 10*3/uL (ref 0.0–0.5)
Eosinophils Relative: 5 %
HCT: 41.9 % (ref 39.0–52.0)
Hemoglobin: 14.3 g/dL (ref 13.0–17.0)
Immature Granulocytes: 0 %
Lymphocytes Relative: 38 %
Lymphs Abs: 1.9 10*3/uL (ref 0.7–4.0)
MCH: 30.3 pg (ref 26.0–34.0)
MCHC: 34.1 g/dL (ref 30.0–36.0)
MCV: 88.8 fL (ref 80.0–100.0)
Monocytes Absolute: 0.5 10*3/uL (ref 0.1–1.0)
Monocytes Relative: 10 %
Neutro Abs: 2.3 10*3/uL (ref 1.7–7.7)
Neutrophils Relative %: 46 %
Platelets: 208 10*3/uL (ref 150–400)
RBC: 4.72 MIL/uL (ref 4.22–5.81)
RDW: 12.3 % (ref 11.5–15.5)
WBC: 5 10*3/uL (ref 4.0–10.5)
nRBC: 0 % (ref 0.0–0.2)

## 2021-02-11 LAB — COMPREHENSIVE METABOLIC PANEL
ALT: 15 U/L (ref 0–44)
AST: 16 U/L (ref 15–41)
Albumin: 3.9 g/dL (ref 3.5–5.0)
Alkaline Phosphatase: 75 U/L (ref 38–126)
Anion gap: 10 (ref 5–15)
BUN: 7 mg/dL (ref 6–20)
CO2: 23 mmol/L (ref 22–32)
Calcium: 9 mg/dL (ref 8.9–10.3)
Chloride: 105 mmol/L (ref 98–111)
Creatinine, Ser: 0.59 mg/dL — ABNORMAL LOW (ref 0.61–1.24)
GFR, Estimated: >60 mL/min (ref >60)
Glucose, Bld: 116 mg/dL — ABNORMAL HIGH (ref 70–99)
Potassium: 3.6 mmol/L (ref 3.5–5.1)
Sodium: 138 mmol/L (ref 135–145)
Total Bilirubin: 1 mg/dL (ref 0.3–1.2)
Total Protein: 6.9 g/dL (ref 6.5–8.1)

## 2021-02-11 LAB — LACTIC ACID, PLASMA: Lactic Acid, Venous: 1.4 mmol/L (ref 0.5–1.9)

## 2021-02-11 LAB — SARS CORONAVIRUS 2 (TAT 6-24 HRS): SARS Coronavirus 2: NEGATIVE

## 2021-02-11 LAB — LIPASE, BLOOD: Lipase: 25 U/L (ref 11–51)

## 2021-02-11 MED ORDER — HYOSCYAMINE SULFATE SL 0.125 MG SL SUBL: SUBLINGUAL_TABLET | SUBLINGUAL | 0 refills | Status: DC

## 2021-02-11 MED ORDER — ONDANSETRON 4 MG PO TBDP
4.0000 mg | ORAL_TABLET | Freq: Once | ORAL | Status: AC
  Administered 2021-02-11: 4 mg via ORAL
  Filled 2021-02-11: qty 1

## 2021-02-11 MED ORDER — HYOSCYAMINE SULFATE 0.125 MG PO TABS
0.2500 mg | ORAL_TABLET | Freq: Once | ORAL | Status: AC
  Administered 2021-02-11: 0.25 mg via ORAL
  Filled 2021-02-11: qty 2

## 2021-02-11 MED ORDER — ONDANSETRON HCL 4 MG/2ML IJ SOLN
4.0000 mg | Freq: Once | INTRAMUSCULAR | Status: DC
  Filled 2021-02-11: qty 2

## 2021-02-11 MED ORDER — ONDANSETRON 4 MG PO TBDP: 4.0000 mg | ORAL_TABLET | Freq: Three times a day (TID) | ORAL | 0 refills | Status: DC | PRN

## 2021-02-11 NOTE — ED Triage Notes (Signed)
Pt states he started having abdominal pain with n/v last night; pt states he woke up at 5am with chills, sweats and body aches; pt denies any known covid exposure; pt fever was 102.3 and took ibuprofen at 0730

## 2021-02-11 NOTE — Discharge Instructions (Signed)
It is more likely your symptoms are from a viral infection as I would not expect such a quick response after eating the shrimp. Rest and drink lots of fluids.  Use the medicines prescribed for symptom relief as this runs its course.     You may try the BRAT diet (bananas, rice, applesauce, toast) also, broth and jello will help give you calories without making your symptoms worse.
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
## 2021-07-19 ENCOUNTER — Encounter (HOSPITAL_COMMUNITY): Payer: Self-pay | Admitting: Emergency Medicine

## 2021-07-19 ENCOUNTER — Emergency Department (HOSPITAL_COMMUNITY)
Admission: EM | Admit: 2021-07-19 | Discharge: 2021-07-20 | Disposition: A | Discharge status: Home / Self Care | Payer: Self-pay | Attending: Emergency Medicine | Admitting: Emergency Medicine

## 2021-07-19 DIAGNOSIS — F419 Anxiety disorder, unspecified: Secondary | ICD-10-CM | POA: Insufficient documentation

## 2021-07-19 DIAGNOSIS — F1410 Cocaine abuse, uncomplicated: Secondary | ICD-10-CM | POA: Diagnosis present

## 2021-07-19 DIAGNOSIS — I10 Essential (primary) hypertension: Secondary | ICD-10-CM | POA: Insufficient documentation

## 2021-07-19 DIAGNOSIS — F1994 Other psychoactive substance use, unspecified with psychoactive substance-induced mood disorder: Secondary | ICD-10-CM

## 2021-07-19 DIAGNOSIS — E876 Hypokalemia: Secondary | ICD-10-CM | POA: Insufficient documentation

## 2021-07-19 DIAGNOSIS — Z20822 Contact with and (suspected) exposure to covid-19: Secondary | ICD-10-CM | POA: Insufficient documentation

## 2021-07-19 DIAGNOSIS — R45851 Suicidal ideations: Secondary | ICD-10-CM | POA: Insufficient documentation

## 2021-07-19 DIAGNOSIS — F1924 Other psychoactive substance dependence with psychoactive substance-induced mood disorder: Secondary | ICD-10-CM | POA: Insufficient documentation

## 2021-07-19 DIAGNOSIS — R109 Unspecified abdominal pain: Secondary | ICD-10-CM | POA: Insufficient documentation

## 2021-07-19 DIAGNOSIS — Z79899 Other long term (current) drug therapy: Secondary | ICD-10-CM | POA: Insufficient documentation

## 2021-07-19 DIAGNOSIS — F1520 Other stimulant dependence, uncomplicated: Secondary | ICD-10-CM | POA: Diagnosis present

## 2021-07-19 DIAGNOSIS — F17210 Nicotine dependence, cigarettes, uncomplicated: Secondary | ICD-10-CM | POA: Insufficient documentation

## 2021-07-19 DIAGNOSIS — F1910 Other psychoactive substance abuse, uncomplicated: Secondary | ICD-10-CM | POA: Diagnosis present

## 2021-07-19 LAB — RAPID URINE DRUG SCREEN, HOSP PERFORMED
Amphetamines: POSITIVE — AB
Barbiturates: NOT DETECTED
Benzodiazepines: NOT DETECTED
Cocaine: POSITIVE — AB
Opiates: NOT DETECTED
Tetrahydrocannabinol: NOT DETECTED

## 2021-07-19 LAB — CBC
HCT: 36.3 % — ABNORMAL LOW (ref 39.0–52.0)
Hemoglobin: 12.3 g/dL — ABNORMAL LOW (ref 13.0–17.0)
MCH: 29.9 pg (ref 26.0–34.0)
MCHC: 33.9 g/dL (ref 30.0–36.0)
MCV: 88.3 fL (ref 80.0–100.0)
Platelets: 191 10*3/uL (ref 150–400)
RBC: 4.11 MIL/uL — ABNORMAL LOW (ref 4.22–5.81)
RDW: 12.1 % (ref 11.5–15.5)
WBC: 5.9 10*3/uL (ref 4.0–10.5)
nRBC: 0 % (ref 0.0–0.2)

## 2021-07-19 LAB — COMPREHENSIVE METABOLIC PANEL
ALT: 16 U/L (ref 0–44)
AST: 18 U/L (ref 15–41)
Albumin: 3.9 g/dL (ref 3.5–5.0)
Alkaline Phosphatase: 83 U/L (ref 38–126)
Anion gap: 8 (ref 5–15)
BUN: 10 mg/dL (ref 6–20)
CO2: 24 mmol/L (ref 22–32)
Calcium: 8.9 mg/dL (ref 8.9–10.3)
Chloride: 107 mmol/L (ref 98–111)
Creatinine, Ser: 0.68 mg/dL (ref 0.61–1.24)
GFR, Estimated: >60 mL/min (ref >60)
Glucose, Bld: 113 mg/dL — ABNORMAL HIGH (ref 70–99)
Potassium: 2.8 mmol/L — ABNORMAL LOW (ref 3.5–5.1)
Sodium: 139 mmol/L (ref 135–145)
Total Bilirubin: 1.2 mg/dL (ref 0.3–1.2)
Total Protein: 6.6 g/dL (ref 6.5–8.1)

## 2021-07-19 LAB — RESP PANEL BY RT-PCR (FLU A&B, COVID) ARPGX2
Influenza A by PCR: NEGATIVE
Influenza B by PCR: NEGATIVE
SARS Coronavirus 2 by RT PCR: NEGATIVE

## 2021-07-19 LAB — SALICYLATE LEVEL: Salicylate Lvl: <7 mg/dL — ABNORMAL LOW (ref 7.0–30.0)

## 2021-07-19 LAB — ETHANOL: Alcohol, Ethyl (B): <10 mg/dL (ref <10)

## 2021-07-19 LAB — ACETAMINOPHEN LEVEL: Acetaminophen (Tylenol), Serum: <10 ug/mL — ABNORMAL LOW (ref 10–30)

## 2021-07-19 MED ORDER — POTASSIUM CHLORIDE CRYS ER 20 MEQ PO TBCR
40.0000 meq | EXTENDED_RELEASE_TABLET | Freq: Once | ORAL | Status: AC
  Administered 2021-07-19: 40 meq via ORAL
  Filled 2021-07-19: qty 2

## 2021-07-19 MED ORDER — OLANZAPINE 5 MG PO TBDP: 5.0000 mg | ORAL_TABLET | Freq: Three times a day (TID) | ORAL | Status: DC | PRN

## 2021-07-19 MED ORDER — LORAZEPAM 2 MG/ML IJ SOLN: 0.0000 mg | Freq: Four times a day (QID) | INTRAMUSCULAR | Status: DC

## 2021-07-19 MED ORDER — THIAMINE HCL 100 MG PO TABS
100.0000 mg | ORAL_TABLET | Freq: Every day | ORAL | Status: DC
  Administered 2021-07-19: 100 mg via ORAL
  Filled 2021-07-19: qty 1

## 2021-07-19 MED ORDER — LORAZEPAM 2 MG/ML IJ SOLN
0.0000 mg | Freq: Two times a day (BID) | INTRAMUSCULAR | Status: DC
Start: 2021-07-21 — End: 2021-07-20

## 2021-07-19 MED ORDER — LORAZEPAM 1 MG PO TABS: 0.0000 mg | ORAL_TABLET | Freq: Two times a day (BID) | ORAL | Status: DC

## 2021-07-19 MED ORDER — LORAZEPAM 1 MG PO TABS
0.0000 mg | ORAL_TABLET | Freq: Four times a day (QID) | ORAL | Status: DC
  Administered 2021-07-19: 4 mg via ORAL
  Filled 2021-07-19: qty 4

## 2021-07-19 MED ORDER — THIAMINE HCL 100 MG/ML IJ SOLN: 100.0000 mg | Freq: Every day | INTRAMUSCULAR | Status: DC

## 2021-07-19 MED ORDER — ZIPRASIDONE MESYLATE 20 MG IM SOLR: 20.0000 mg | INTRAMUSCULAR | Status: DC | PRN

## 2021-07-19 MED ORDER — GABAPENTIN 300 MG PO CAPS
300.0000 mg | ORAL_CAPSULE | Freq: Three times a day (TID) | ORAL | Status: DC
  Administered 2021-07-19: 300 mg via ORAL
  Filled 2021-07-19: qty 1

## 2021-07-19 MED ORDER — LORAZEPAM 1 MG PO TABS
1.0000 mg | ORAL_TABLET | ORAL | Status: AC | PRN
  Administered 2021-07-20: 1 mg via ORAL
  Filled 2021-07-19: qty 1
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
## 2021-07-19 NOTE — ED Notes (Signed)
Pt's belongings in one bag moved to the cabinet labeled, "patient belongings [address] [place] [address]."

## 2021-07-19 NOTE — ED Notes (Signed)
When getting wheeled to the lobby to wait, patient stated that he now wants to kill himself.

## 2021-07-19 NOTE — Consult Note (Signed)
Carl Valencia is a 40 year old male with a past psychiatric history of polysubstance abuse, substance induced mood disorder who presented to [facility] voluntarily with chief complaint of suicidal ideations. UDS+amphetamines, cocaine; BAL<10. PDMP reviewed; no activity since 03/08/21.   Plan:   -Patient to continue to be observed; to be reassessed by psychiatry in the morning.   -Start:    -Gabapentin 300 mg TID    -cocaine withdrawal symptoms    -Agitation orders    -breakthrough agitation symptoms
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
## 2021-07-19 NOTE — ED Triage Notes (Signed)
Pt reports bilateral lower quadrant abdominal pain. Last used heroin "around 7p-8p." Reports that he uses heroin daily.

## 2021-07-20 ENCOUNTER — Other Ambulatory Visit: Payer: Self-pay

## 2021-07-20 ENCOUNTER — Emergency Department (HOSPITAL_BASED_OUTPATIENT_CLINIC_OR_DEPARTMENT_OTHER)
Admission: EM | Admit: 2021-07-20 | Discharge: 2021-07-21 | Disposition: A | Discharge status: Home / Self Care | Payer: Self-pay | Attending: Emergency Medicine | Admitting: Emergency Medicine

## 2021-07-20 ENCOUNTER — Encounter (HOSPITAL_BASED_OUTPATIENT_CLINIC_OR_DEPARTMENT_OTHER): Payer: Self-pay | Admitting: *Deleted

## 2021-07-20 ENCOUNTER — Emergency Department (HOSPITAL_BASED_OUTPATIENT_CLINIC_OR_DEPARTMENT_OTHER): Payer: Self-pay

## 2021-07-20 DIAGNOSIS — Z79899 Other long term (current) drug therapy: Secondary | ICD-10-CM | POA: Insufficient documentation

## 2021-07-20 DIAGNOSIS — I10 Essential (primary) hypertension: Secondary | ICD-10-CM | POA: Insufficient documentation

## 2021-07-20 DIAGNOSIS — Z20822 Contact with and (suspected) exposure to covid-19: Secondary | ICD-10-CM | POA: Insufficient documentation

## 2021-07-20 DIAGNOSIS — F1520 Other stimulant dependence, uncomplicated: Secondary | ICD-10-CM | POA: Insufficient documentation

## 2021-07-20 DIAGNOSIS — F17210 Nicotine dependence, cigarettes, uncomplicated: Secondary | ICD-10-CM | POA: Insufficient documentation

## 2021-07-20 DIAGNOSIS — R45851 Suicidal ideations: Secondary | ICD-10-CM | POA: Insufficient documentation

## 2021-07-20 DIAGNOSIS — Y900 Blood alcohol level of less than 20 mg/100 ml: Secondary | ICD-10-CM | POA: Insufficient documentation

## 2021-07-20 DIAGNOSIS — F1124 Opioid dependence with opioid-induced mood disorder: Secondary | ICD-10-CM | POA: Insufficient documentation

## 2021-07-20 DIAGNOSIS — F1994 Other psychoactive substance use, unspecified with psychoactive substance-induced mood disorder: Secondary | ICD-10-CM

## 2021-07-20 DIAGNOSIS — S20212A Contusion of left front wall of thorax, initial encounter: Secondary | ICD-10-CM | POA: Insufficient documentation

## 2021-07-20 LAB — CBC
HCT: 36.6 % — ABNORMAL LOW (ref 39.0–52.0)
Hemoglobin: 12.9 g/dL — ABNORMAL LOW (ref 13.0–17.0)
MCH: 29.9 pg (ref 26.0–34.0)
MCHC: 35.2 g/dL (ref 30.0–36.0)
MCV: 84.7 fL (ref 80.0–100.0)
Platelets: 220 10*3/uL (ref 150–400)
RBC: 4.32 MIL/uL (ref 4.22–5.81)
RDW: 12 % (ref 11.5–15.5)
WBC: 8.1 10*3/uL (ref 4.0–10.5)
nRBC: 0 % (ref 0.0–0.2)

## 2021-07-20 LAB — RAPID URINE DRUG SCREEN, HOSP PERFORMED
Amphetamines: NOT DETECTED
Barbiturates: NOT DETECTED
Benzodiazepines: NOT DETECTED
Cocaine: POSITIVE — AB
Opiates: NOT DETECTED
Tetrahydrocannabinol: NOT DETECTED

## 2021-07-20 LAB — COMPREHENSIVE METABOLIC PANEL
ALT: 11 U/L (ref 0–44)
AST: 19 U/L (ref 15–41)
Albumin: 4.2 g/dL (ref 3.5–5.0)
Alkaline Phosphatase: 81 U/L (ref 38–126)
Anion gap: 14 (ref 5–15)
BUN: 12 mg/dL (ref 6–20)
CO2: 20 mmol/L — ABNORMAL LOW (ref 22–32)
Calcium: 9.8 mg/dL (ref 8.9–10.3)
Chloride: 109 mmol/L (ref 98–111)
Creatinine, Ser: 0.71 mg/dL (ref 0.61–1.24)
GFR, Estimated: >60 mL/min (ref >60)
Glucose, Bld: 105 mg/dL — ABNORMAL HIGH (ref 70–99)
Potassium: 3.4 mmol/L — ABNORMAL LOW (ref 3.5–5.1)
Sodium: 143 mmol/L (ref 135–145)
Total Bilirubin: 1.2 mg/dL (ref 0.3–1.2)
Total Protein: 7.1 g/dL (ref 6.5–8.1)

## 2021-07-20 MED ORDER — ONDANSETRON 4 MG PO TBDP
4.0000 mg | ORAL_TABLET | Freq: Four times a day (QID) | ORAL | Status: DC | PRN
  Filled 2021-07-20: qty 1

## 2021-07-20 MED ORDER — CLONIDINE HCL 0.1 MG PO TABS
0.1000 mg | ORAL_TABLET | ORAL | Status: DC
Start: 2021-07-23 — End: 2021-07-21

## 2021-07-20 MED ORDER — DICYCLOMINE HCL 20 MG PO TABS
20.0000 mg | ORAL_TABLET | Freq: Four times a day (QID) | ORAL | Status: DC | PRN
  Filled 2021-07-20: qty 1

## 2021-07-20 MED ORDER — NICOTINE 21 MG/24HR TD PT24
21.0000 mg | MEDICATED_PATCH | Freq: Every day | TRANSDERMAL | Status: DC
  Filled 2021-07-20: qty 1

## 2021-07-20 MED ORDER — METHOCARBAMOL 500 MG PO TABS
500.0000 mg | ORAL_TABLET | Freq: Three times a day (TID) | ORAL | Status: DC | PRN
  Administered 2021-07-20: 500 mg via ORAL
  Filled 2021-07-20: qty 1

## 2021-07-20 MED ORDER — CLONIDINE HCL 0.1 MG PO TABS
0.1000 mg | ORAL_TABLET | Freq: Four times a day (QID) | ORAL | Status: DC
  Administered 2021-07-20 – 2021-07-21 (×3): 0.1 mg via ORAL
  Filled 2021-07-20 (×3): qty 1

## 2021-07-20 MED ORDER — HYDROXYZINE HCL 25 MG PO TABS
25.0000 mg | ORAL_TABLET | Freq: Four times a day (QID) | ORAL | Status: DC | PRN
  Administered 2021-07-20 – 2021-07-21 (×2): 25 mg via ORAL
  Filled 2021-07-20 (×2): qty 1

## 2021-07-20 MED ORDER — IBUPROFEN 800 MG PO TABS
800.0000 mg | ORAL_TABLET | Freq: Three times a day (TID) | ORAL | Status: DC | PRN
  Administered 2021-07-20: 800 mg via ORAL
  Filled 2021-07-20: qty 1

## 2021-07-20 MED ORDER — LOPERAMIDE HCL 2 MG PO CAPS
2.0000 mg | ORAL_CAPSULE | ORAL | Status: DC | PRN
  Administered 2021-07-20: 2 mg via ORAL
  Filled 2021-07-20: qty 1

## 2021-07-20 MED ORDER — POTASSIUM CHLORIDE CRYS ER 20 MEQ PO TBCR
40.0000 meq | EXTENDED_RELEASE_TABLET | Freq: Once | ORAL | Status: DC
Start: 2021-07-20 — End: 2021-07-21

## 2021-07-20 MED ORDER — CLONIDINE HCL 0.1 MG PO TABS
0.1000 mg | ORAL_TABLET | Freq: Every day | ORAL | Status: DC
Start: 2021-07-26 — End: 2021-07-21

## 2021-07-20 NOTE — Discharge Instructions (Signed)
Substance Abuse Resources   [facility] - Admissions are currently completed Monday through Friday at 8am; both appointments and walk-ins are accepted.  Any individual that is a [place] resident may present for a substance abuse screening and assessment for admission.  A person may be referred by numerous sources or self-refer.   Potential clients will be screened for medical necessity and appropriateness for the program.  Clients must meet criteria for high-intensity residential treatment services.  If clinically appropriate, a client will continue with the comprehensive clinical assessment and intake process, as well as enrollment in the [facility].  Address: [address] [place], [place] [address] Admin Hours: Mon-Fri 8AM to 5PM Center Hours: 24/7 Phone: [phone] Fax: [phone]  [facility] Based Crisis:  These are 3 locations for services: Please call before arrival:    [facility] ([facility])  Address: [address] [place], [place] [address] Phone: [phone]  [facility] ([facility]) Address: [address], [place], [place] [address] Phone#: [phone]  [facility] ([facility]) Address: [address], [place], [place] [address] Phone#: [phone]   [facility]): (offers outpatient therapy and intensive outpatient substance abuse therapy).  [address], [place], [place] [address] Phone: [phone]  [facility] Address: [address] [place], [place] [address] Phone: [phone]  -[facility] provides food, shelter and other programs and services to the homeless men of [place] through our men's program.  By offering safe shelter, three meals a day, clean clothing, Biblical counseling, financial planning, vocational training, GED/education and  employment assistance, we've helped mend the shattered lives of many homeless men since opening in 1974.  We have approximately 267 beds available, with a max of 312 beds including mats for emergency situations and currently house an average of 270 men a night.  Prospective Client Check-In Information Photo ID Required (State/ Out of State/ DOC) - if photo ID is not available, clients are required to have a printout of a police/sheriff's criminal history report. Help out with chores around the [facility]. No sex offender of any type (pending, charged, registered and/or any other sex related offenses) will be permitted to check in. Must be willing to abide by all rules, regulations, and policies established by the [facility]. The following will be provided - shelter, food, clothing, and biblical counseling. If you or someone you know is in need of assistance at our men's shelter in [place], [place], please call [phone]. [phone].  [facility] intake hours are Monday-Friday only.   [facility]:   Phone: [phone]  The [facility] SA Intensive Outpatient Program (SAIOP) means structured individual and group addiction activities and services that are provided at an outpatient program designed to assist adult and adolescent consumers to begin recovery and learn skills for recovery maintenance. The [facility] SAIOP program is offered at least 3 hours a day, 3 days a week. SAIOP services shall include a structured program consisting of, but not limited to, the following services: Individual counseling and support; Group counseling and support; Family counseling, training or support; Biochemical assays to identify recent drug use (e.g., urine drug screens); Strategies for relapse prevention to include community and social support systems in treatment; Life skills; Crisis contingency planning; Disease Management; and Treatment  support activities that have been adapted or   specifically designed for persons with physical disabilities, or persons with co-occurring disorders of mental illness and substance abuse/dependence or mental retardation/developmental disability and substance abuse/dependence.  Phone: [phone]   [facility])  Address: [address], [place], [place] [address] Phone: [phone]   [facility] Address: [address], [place], [place] [address] Phone: [phone]  - a combination of group and individual sessions to meet the participants needs. This allows participants to engage in treatment and remain involved in their home and work life. - Transitional housing places program participants in a supportive living environment while they complete a treatment program and work to secure independent housing. - The Substance Abuse Intensive Outpatient Treatment Program at [facility] consists of structured group sessions and individual sessions that are designed to teach participants early recovery and relapse prevention skills. -[facility] works with the [employer] to provide a housing and treatment program for homeless veterans.    The [facility] will also offer the following outpatient services: (Monday through Friday 8am-5pm)   Partial Hospitalization Program (PHP) Substance Abuse Intensive Outpatient Program (SA-IOP) Group Therapy Medication Management Peer Living Room We also provide (24/7):  Assessments: Our mental health clinician and providers will conduct a focused mental health evaluation, assessing for immediate safety concerns and further mental health needs. Referral: Our team will provide resources and help connect to community based mental health treatment, when indicated, including psychotherapy, psychiatry, and other specialized behavioral health or substance use disorder services (for those not already  in treatment). Transitional Care: Our team providers in person bridging and/or telephonic follow-up during the patient's transition to outpatient services.   [facility] 24-Hour Call Center: 1-800-256-2452 Behavioral Health Crisis Line: [phone]  

## 2021-07-20 NOTE — Progress Notes (Signed)
CSW provided the following resources to the patient via AVS at the provider's request:   Substance Abuse Resources   [facility] - Admissions are currently completed Monday through Friday at 8am; both appointments and walk-ins are accepted.  Any individual that is a [facility] resident may present for a substance abuse screening and assessment for admission.  A person may be referred by numerous sources or self-refer.   Potential clients will be screened for medical necessity and appropriateness for the program.  Clients must meet criteria for high-intensity residential treatment services.  If clinically appropriate, a client will continue with the comprehensive clinical assessment and intake process, as well as enrollment in the [facility].  Address: [address] [place], [place] [address] Admin Hours: Mon-Fri 8AM to [facility] Center Hours: 24/7 Phone: [phone] Fax: [phone]  [facility] Crisis:  These are 3 locations for services: Please call before arrival:    [facility] Based Crisis [facility])  Address: [address] [place], [place] [address] Phone: [phone]  [facility] Recovery Facility Based Crisis [facility]) Address: [address] [place], [place] [address] Phone#: [phone]  [facility] Facility Based Crisis [facility]) Address: [address] [place], [place] [address] Phone#: [phone]   [facility] ([facility]): (offers outpatient therapy and intensive outpatient substance abuse therapy).  [address], [place], [place] [address] Phone: [phone]  [facility] Address: [address] [place], [place] [address] Phone: [phone]  -The [facility] provides food, shelter and other programs and services to the homeless men of [place] through our [employer] program.  By offering safe shelter, three meals a day,  clean clothing, Biblical counseling, financial planning, vocational training, GED/education and employment assistance, we've helped mend the shattered lives of many homeless men since opening in [place].  We have approximately 267 beds available, with a max of 312 beds including mats for emergency situations and currently house an average of 270 men a night.  Prospective Client Check-In Information Photo ID Required (State/ Out of State/ [facility]) - if photo ID is not available, clients are required to have a printout of a police/sheriff's criminal history report. Help out with chores around the [facility]. No sex offender of any type (pending, charged, registered and/or any other sex related offenses) will be permitted to check in. Must be willing to abide by all rules, regulations, and policies established by the [employer]. The following will be provided - shelter, food, clothing, and biblical counseling. If you or someone you know is in need of assistance at our [facility] shelter in [place], [place], please call [phone]. [phone].  [facility] for [employer] hours are Monday-Friday only.   [facility]:   Phone: [phone]  The [facility] SA Intensive Outpatient Program [facility]) means structured individual and group addiction activities and services that are provided at an outpatient program designed to assist adult and adolescent consumers to begin recovery and learn skills for recovery maintenance. The [facility] SAIOP program is offered at least 3 hours a day, 3 days a week. SAIOP services shall include a structured program consisting of, but not limited to, the following services: Individual counseling and support; Group counseling and support; Family counseling, training or support; Biochemical assays to identify recent drug use (e.g., urine drug screens); Strategies for relapse prevention to include community and social support  systems in treatment;  Life skills; Crisis contingency planning; Disease Management; and Treatment support activities that have been adapted or specifically designed for persons with physical disabilities, or persons with co-occurring disorders of mental illness and substance abuse/dependence or mental retardation/developmental disability and substance abuse/dependence.  Phone: [phone]   [facility])  Address: [address] [place], [place], [place] [address] Phone: [phone]   [facility] Address: [address] [place], [place] [address] Phone: [phone]  - a combination of group and individual sessions to meet the participants needs. This allows participants to engage in treatment and remain involved in their home and work life. - Transitional housing places program participants in a supportive living environment while they complete a treatment program and work to secure independent housing. - The Substance Abuse Intensive Outpatient Treatment Program at [employer] consists of structured group sessions and individual sessions that are designed to teach participants early recovery and relapse prevention skills. -[facility] works with the [employer] to provide a housing and treatment program for homeless veterans.    [facility] will also offer the following outpatient services: (Monday through Friday 8am-5pm)   Partial Hospitalization Program (PHP) Substance Abuse Intensive Outpatient Program (SA-IOP) Group Therapy Medication Management Peer Living Room We also provide (24/7):  Assessments: Our mental health clinician and providers will conduct a focused mental health evaluation, assessing for immediate safety concerns and further mental health needs. Referral: Our team will provide resources and help connect to community based mental health treatment, when indicated, including psychotherapy, psychiatry,  and other specialized behavioral health or substance use disorder services (for those not already in treatment). Transitional Care: Our team providers in person bridging and/or telephonic follow-up during the patient's transition to outpatient services.   [facility] 24-Hour Call Center: [phone] Behavioral Health Crisis Line: [phone]  Crissie Reese, MSW, LCSW-A, [place] Phone: [phone] Disposition/TOC
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
## 2021-07-20 NOTE — ED Notes (Signed)
BP 120/80, HR 78 no sweats or shakes noted denies pain alert x3 returned to sleep.
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
## 2021-07-20 NOTE — ED Notes (Signed)
Pt upset that he is being d/c states we are putting him on the street again. Pt was given a bus pass.
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
## 2021-07-20 NOTE — ED Provider Notes (Signed)
9:46 AM Per psychiatry patient appropriate for discharge with substance abuse counseling resources.   Gerhard Munch, MD 07/20/21 [phone]

## 2021-07-21 ENCOUNTER — Encounter (HOSPITAL_COMMUNITY): Payer: Self-pay | Admitting: Emergency Medicine

## 2021-07-21 ENCOUNTER — Inpatient Hospital Stay (HOSPITAL_COMMUNITY)
Admission: AD | Admit: 2021-07-21 | Discharge: 2021-07-24 | DRG: 897 | Disposition: A | Discharge status: Home / Self Care | Payer: Federal, State, Local not specified - Other | Source: Intra-hospital | Attending: Psychiatry | Admitting: Psychiatry

## 2021-07-21 DIAGNOSIS — F1423 Cocaine dependence with withdrawal: Secondary | ICD-10-CM | POA: Diagnosis present

## 2021-07-21 DIAGNOSIS — R45851 Suicidal ideations: Secondary | ICD-10-CM | POA: Diagnosis present

## 2021-07-21 DIAGNOSIS — F1410 Cocaine abuse, uncomplicated: Secondary | ICD-10-CM | POA: Diagnosis present

## 2021-07-21 DIAGNOSIS — Z885 Allergy status to narcotic agent status: Secondary | ICD-10-CM

## 2021-07-21 DIAGNOSIS — F17210 Nicotine dependence, cigarettes, uncomplicated: Secondary | ICD-10-CM | POA: Diagnosis present

## 2021-07-21 DIAGNOSIS — Z886 Allergy status to analgesic agent status: Secondary | ICD-10-CM

## 2021-07-21 DIAGNOSIS — F1120 Opioid dependence, uncomplicated: Secondary | ICD-10-CM | POA: Diagnosis not present

## 2021-07-21 DIAGNOSIS — Z880 Allergy status to penicillin: Secondary | ICD-10-CM | POA: Diagnosis not present

## 2021-07-21 DIAGNOSIS — F1124 Opioid dependence with opioid-induced mood disorder: Principal | ICD-10-CM | POA: Diagnosis present

## 2021-07-21 DIAGNOSIS — Z882 Allergy status to sulfonamides status: Secondary | ICD-10-CM | POA: Diagnosis not present

## 2021-07-21 DIAGNOSIS — F1424 Cocaine dependence with cocaine-induced mood disorder: Secondary | ICD-10-CM | POA: Diagnosis not present

## 2021-07-21 DIAGNOSIS — F419 Anxiety disorder, unspecified: Secondary | ICD-10-CM | POA: Diagnosis present

## 2021-07-21 DIAGNOSIS — Z5900 Homelessness unspecified: Secondary | ICD-10-CM | POA: Diagnosis not present

## 2021-07-21 DIAGNOSIS — F1523 Other stimulant dependence with withdrawal: Secondary | ICD-10-CM | POA: Diagnosis present

## 2021-07-21 DIAGNOSIS — F1123 Opioid dependence with withdrawal: Secondary | ICD-10-CM | POA: Diagnosis present

## 2021-07-21 DIAGNOSIS — Z79899 Other long term (current) drug therapy: Secondary | ICD-10-CM | POA: Diagnosis not present

## 2021-07-21 DIAGNOSIS — I10 Essential (primary) hypertension: Secondary | ICD-10-CM | POA: Diagnosis present

## 2021-07-21 DIAGNOSIS — Z888 Allergy status to other drugs, medicaments and biological substances status: Secondary | ICD-10-CM

## 2021-07-21 DIAGNOSIS — Z20822 Contact with and (suspected) exposure to covid-19: Secondary | ICD-10-CM | POA: Diagnosis present

## 2021-07-21 LAB — RESP PANEL BY RT-PCR (FLU A&B, COVID) ARPGX2
Influenza A by PCR: NEGATIVE
Influenza B by PCR: NEGATIVE
SARS Coronavirus 2 by RT PCR: NEGATIVE

## 2021-07-21 LAB — SALICYLATE LEVEL: Salicylate Lvl: <7 mg/dL — ABNORMAL LOW (ref 7.0–30.0)

## 2021-07-21 LAB — ETHANOL: Alcohol, Ethyl (B): <10 mg/dL (ref <10)

## 2021-07-21 LAB — ACETAMINOPHEN LEVEL: Acetaminophen (Tylenol), Serum: <10 ug/mL — ABNORMAL LOW (ref 10–30)

## 2021-07-21 MED ORDER — LORAZEPAM 1 MG PO TABS: 1.0000 mg | ORAL_TABLET | ORAL | Status: DC | PRN

## 2021-07-21 MED ORDER — LORAZEPAM 2 MG/ML IJ SOLN: 2.0000 mg | Freq: Once | INTRAMUSCULAR | Status: AC

## 2021-07-21 MED ORDER — LORAZEPAM 1 MG PO TABS
1.0000 mg | ORAL_TABLET | Freq: Once | ORAL | Status: AC
  Administered 2021-07-21: 1 mg via ORAL
  Filled 2021-07-21: qty 1

## 2021-07-21 MED ORDER — ZIPRASIDONE MESYLATE 20 MG IM SOLR
20.0000 mg | Freq: Once | INTRAMUSCULAR | Status: AC
  Administered 2021-07-21: 20 mg via INTRAMUSCULAR

## 2021-07-21 MED ORDER — ZIPRASIDONE MESYLATE 20 MG IM SOLR: 20.0000 mg | INTRAMUSCULAR | Status: DC | PRN

## 2021-07-21 MED ORDER — ALUM & MAG HYDROXIDE-SIMETH 200-200-20 MG/5ML PO SUSP: 30.0000 mL | ORAL | Status: DC | PRN

## 2021-07-21 MED ORDER — LORAZEPAM 2 MG/ML IJ SOLN
INTRAMUSCULAR | Status: AC
  Administered 2021-07-21: 2 mg via INTRAVENOUS
  Filled 2021-07-21: qty 1

## 2021-07-21 MED ORDER — OLANZAPINE 10 MG PO TBDP: 10.0000 mg | ORAL_TABLET | Freq: Three times a day (TID) | ORAL | Status: DC | PRN

## 2021-07-21 MED ORDER — MAGNESIUM HYDROXIDE 400 MG/5ML PO SUSP: 30.0000 mL | Freq: Every day | ORAL | Status: DC | PRN

## 2021-07-21 MED ORDER — HYDROXYZINE HCL 25 MG PO TABS: 25.0000 mg | ORAL_TABLET | Freq: Three times a day (TID) | ORAL | Status: DC | PRN

## 2021-07-21 MED ORDER — LORAZEPAM 2 MG/ML IJ SOLN
INTRAMUSCULAR | Status: AC
  Filled 2021-07-21: qty 1

## 2021-07-21 MED ORDER — ZIPRASIDONE MESYLATE 20 MG IM SOLR
INTRAMUSCULAR | Status: AC
  Filled 2021-07-21: qty 20

## 2021-07-21 MED ORDER — LORAZEPAM 1 MG PO TABS: 2.0000 mg | ORAL_TABLET | Freq: Once | ORAL | Status: DC

## 2021-07-21 NOTE — ED Notes (Signed)
Pt requesting to have his clothes returned so he can leave. He has been made aware he is going to [facility]. States he wants to leave. Denies SI/HI. Dr Lockie Mola aware. He checked with [facility] who recommends pt go the [facility]. IVC paperwork started.

## 2021-07-21 NOTE — [facility] Group Notes (Signed)
[facility] Group Notes:  (Nursing/MHT/Case Management/Adjunct)  Date:  07/21/2021  Time:  11:25 PM  Type of Therapy:  Group Therapy  Participation Level:  Did Not Attend  Participation Quality:   na  Affect:   na  Cognitive:   na  Insight:  None  Engagement in Group:   na  Modes of Intervention:   na  Summary of Progress/Problems:  Lorita Officer 07/21/2021, 11:25 PM

## 2021-07-21 NOTE — ED Notes (Signed)
Patient stated that he needs something for anxiety EDP made aware.

## 2021-07-21 NOTE — ED Notes (Addendum)
Pt clothes inventoried with ED tech Amy. Pt changed into Psychiatric Pt scrubs.   Pt clothes consist of:  Public librarian
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
## 2021-07-21 NOTE — BH Assessment (Signed)
Binnie Rail, [facility] states Pt has been accepted to [facility], bed 302-1, after 0800. Accepting physician is Dr. Mason Jim. Number for RN report is [phone]. Notified Dr. Dimas Millin and Marguerita Merles, RN of acceptance via secure chat.   Pamalee Leyden, [facility], [facility] Triage Specialist [phone]
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
## 2021-07-21 NOTE — ED Notes (Signed)
Second attempt to call for report. 
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
## 2021-07-21 NOTE — ED Provider Notes (Signed)
Patient accepted to behavioral health with Dr. Mason Jim.  Patient had to be IVC as he did not longer want to go voluntarily.  He started to get agitated but after discussion this was because he was having some withdrawal symptoms from opioids.  Was able to get him to stay and gave him Ativan another as needed meds for opioid withdrawal.  He has been on clonidine taper.   Virgina Norfolk, DO 07/21/21 1133

## 2021-07-21 NOTE — ED Notes (Signed)
Pt has been accepted to [facility], bed 302-1, after 0800. Accepting physician is Dr. Mason Jim. Number for RN report is [phone].

## 2021-07-21 NOTE — ED Notes (Signed)
Attempted to call report.  Charge nurse is attending bed meeting.

## 2021-07-21 NOTE — ED Notes (Signed)
Presently waiting for [employer] to transport pt.

## 2021-07-21 NOTE — ED Notes (Signed)
Spoke with [facility] AC who advised due to callouts pt will not be able to got to [facility] until they get more staff, hopefully around 1230-1300

## 2021-07-21 NOTE — Progress Notes (Signed)
Pt been sleep much of the evening    07/21/21 2200  Psych Admission Type (Psych Patients Only)  Admission Status Involuntary  Psychosocial Assessment  Patient Complaints Anxiety  Eye Contact Darting  Facial Expression Pained  Affect Apathetic  Speech Rapid  Interaction Demanding  Motor Activity Restless;Tremors  Appearance/Hygiene Body odor;Disheveled;Poor hygiene  Behavior Characteristics Anxious  Mood Anxious  Thought Process  Coherency WDL  Content WDL  Danger to Self  Current suicidal ideation? Active;Verbalizes  Danger to Others  Danger to Others None reported or observed

## 2021-07-21 NOTE — ED Notes (Addendum)
Report given to [place] at [facility].  Patient is restless and wanting to go home.  He stated that he stayed with his Aunt and Uncle.

## 2021-07-22 DIAGNOSIS — F1120 Opioid dependence, uncomplicated: Secondary | ICD-10-CM

## 2021-07-22 DIAGNOSIS — F1124 Opioid dependence with opioid-induced mood disorder: Principal | ICD-10-CM

## 2021-07-22 DIAGNOSIS — F1424 Cocaine dependence with cocaine-induced mood disorder: Secondary | ICD-10-CM

## 2021-07-22 DIAGNOSIS — F1123 Opioid dependence with withdrawal: Secondary | ICD-10-CM

## 2021-07-22 LAB — RESP PANEL BY RT-PCR (FLU A&B, COVID) ARPGX2
Influenza A by PCR: NEGATIVE
Influenza B by PCR: NEGATIVE
SARS Coronavirus 2 by RT PCR: NEGATIVE

## 2021-07-22 MED ORDER — LORAZEPAM 1 MG PO TABS
1.0000 mg | ORAL_TABLET | Freq: Four times a day (QID) | ORAL | Status: DC | PRN
  Administered 2021-07-22: 1 mg via ORAL
  Filled 2021-07-22: qty 1

## 2021-07-22 MED ORDER — DICYCLOMINE HCL 20 MG PO TABS: 20.0000 mg | ORAL_TABLET | Freq: Four times a day (QID) | ORAL | Status: DC | PRN

## 2021-07-22 MED ORDER — ONDANSETRON 4 MG PO TBDP: 4.0000 mg | ORAL_TABLET | Freq: Four times a day (QID) | ORAL | Status: DC | PRN

## 2021-07-22 MED ORDER — FOLIC ACID 1 MG PO TABS
1.0000 mg | ORAL_TABLET | Freq: Every day | ORAL | Status: DC
  Administered 2021-07-22 – 2021-07-24 (×3): 1 mg via ORAL
  Filled 2021-07-22 (×5): qty 1

## 2021-07-22 MED ORDER — GABAPENTIN 300 MG PO CAPS
300.0000 mg | ORAL_CAPSULE | Freq: Three times a day (TID) | ORAL | Status: DC
  Administered 2021-07-22 – 2021-07-24 (×5): 300 mg via ORAL
  Filled 2021-07-22 (×3): qty 21
  Filled 2021-07-22 (×7): qty 1
  Filled 2021-07-22: qty 21
  Filled 2021-07-22: qty 1
  Filled 2021-07-22: qty 21
  Filled 2021-07-22: qty 1
  Filled 2021-07-22: qty 21

## 2021-07-22 MED ORDER — THIAMINE HCL 100 MG PO TABS
100.0000 mg | ORAL_TABLET | Freq: Every day | ORAL | Status: DC
  Administered 2021-07-22 – 2021-07-24 (×3): 100 mg via ORAL
  Filled 2021-07-22 (×7): qty 1

## 2021-07-22 MED ORDER — CLONIDINE HCL 0.1 MG PO TABS
0.1000 mg | ORAL_TABLET | ORAL | Status: DC
  Filled 2021-07-22 (×3): qty 1

## 2021-07-22 MED ORDER — METHOCARBAMOL 500 MG PO TABS
500.0000 mg | ORAL_TABLET | Freq: Three times a day (TID) | ORAL | Status: DC | PRN
  Administered 2021-07-23: 500 mg via ORAL
  Filled 2021-07-22: qty 1

## 2021-07-22 MED ORDER — ONDANSETRON 4 MG PO TBDP
8.0000 mg | ORAL_TABLET | Freq: Four times a day (QID) | ORAL | Status: DC | PRN
  Administered 2021-07-22 (×2): 8 mg via ORAL
  Filled 2021-07-22 (×2): qty 2

## 2021-07-22 MED ORDER — CLONIDINE HCL 0.1 MG PO TABS
0.1000 mg | ORAL_TABLET | Freq: Four times a day (QID) | ORAL | Status: DC
  Administered 2021-07-22 – 2021-07-23 (×5): 0.1 mg via ORAL
  Filled 2021-07-22 (×7): qty 1

## 2021-07-22 MED ORDER — LOPERAMIDE HCL 2 MG PO CAPS
2.0000 mg | ORAL_CAPSULE | ORAL | Status: DC | PRN
  Administered 2021-07-23: 2 mg via ORAL
  Administered 2021-07-24: 4 mg via ORAL
  Filled 2021-07-22: qty 2
  Filled 2021-07-22: qty 1

## 2021-07-22 MED ORDER — CLONIDINE HCL 0.1 MG PO TABS: 0.1000 mg | ORAL_TABLET | Freq: Every day | ORAL | Status: DC

## 2021-07-22 MED ORDER — HYDROXYZINE HCL 50 MG PO TABS
50.0000 mg | ORAL_TABLET | Freq: Three times a day (TID) | ORAL | Status: DC | PRN
  Administered 2021-07-22 – 2021-07-23 (×2): 50 mg via ORAL
  Filled 2021-07-22: qty 10
  Filled 2021-07-22 (×2): qty 1

## 2021-07-22 NOTE — Progress Notes (Signed)
Staff asked patient to come to have vitals sign this morning. Patient said staff needs to come to his room to take vital sign. Staff explain he need to come in the hallway. RN notify.
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
## 2021-07-22 NOTE — Progress Notes (Addendum)
Pt stayed in room much of the evening, pt endorsed withdrawal Sx . Pt given PRN Vistaril and Zofran per MAR with HS medication    07/22/21 2300  Psych Admission Type (Psych Patients Only)  Admission Status Involuntary  Psychosocial Assessment  Patient Complaints Substance abuse  Eye Contact Darting  Facial Expression Flat  Affect Apathetic  Speech Rapid  Interaction Demanding  Motor Activity Restless;Tremors  Appearance/Hygiene Body odor;Disheveled;Poor hygiene  Behavior Characteristics Cooperative  Mood Depressed  Thought Process  Coherency WDL  Content WDL  Delusions None reported or observed  Perception WDL  Hallucination None reported or observed  Judgment Limited  Confusion None  Danger to Self  Current suicidal ideation? Denies  Self-Injurious Behavior No self-injurious ideation or behavior indicators observed or expressed   Agreement Not to Harm Self No  Danger to Others  Danger to Others None reported or observed

## 2021-07-22 NOTE — BHH Counselor (Signed)
CSW attempted to do a social work assessment.  Patient was shaking and expressed having withdrawal effects and not in a position to do an assessment.     Lashai Grosch, LCSW, LCAS Clincal Social Worker  [facility]

## 2021-07-22 NOTE — Progress Notes (Signed)
Pt has been alert and oriented to person, place, time and situation. Pt is calm, cooperative, denies suicidal and homicidal ideation, reports feeling depression and anxiety, isolates, reports withdrawal symptoms from opiates that make him feel tired and have body aches. Pt appetite has been poor. No distress noted or reported. Will continue to monitor pt per Q15 minute face checks and monitor for safety and progress.

## 2021-07-22 NOTE — Progress Notes (Signed)
Psychoeducational Group Note  Date:  07/22/2021 Time:  2313  Group Topic/Focus:  Wrap-Up Group:   The focus of this group is to help patients review their daily goal of treatment and discuss progress on daily workbooks.  Participation Level: Did Not Attend  Participation Quality:  Not Applicable  Affect:  Not Applicable  Cognitive:  Not Applicable  Insight:  Not Applicable  Engagement in Group: Not Applicable  Additional Comments:  The patient did not attend group since he was not feeling well.   Tiron Suski S 07/22/2021, 11:13 PM

## 2021-07-22 NOTE — [facility] Group Notes (Signed)
Patient did not attend group.    Spiritual care group on grief and loss facilitated by chaplain Katy Zenas Santa, [facility]   Group Goal:   Support / Education around grief and loss   Members engage in facilitated group support and psycho-social education.   Group Description:   Following introductions and group rules, group members engaged in facilitated group dialog and support around topic of loss, with particular support around experiences of loss in their lives. Group Identified types of loss (relationships / self / things) and identified patterns, circumstances, and changes that precipitate losses. Reflected on thoughts / feelings around loss, normalized grief responses, and recognized variety in grief experience. Group noted Worden's four tasks of grief in discussion.   Group drew on Adlerian / Rogerian, narrative, MI,    
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
## 2021-07-23 NOTE — Progress Notes (Addendum)
Pt refused afternoon vital signs, refused afternoon meds, is very irritable, said he wanted to be left alone to sleep. Will continue to monitor pt per Q15 minute face checks and monitor for safety and progress.   Pt is A&Ox4, calm, isolates in his room napping most of the day. Pt does not come out for morning medications, but did accept them from nurse when taken to his room.  Will continue to monitor pt per Q15 minute face checks and monitor for safety and progress.

## 2021-07-23 NOTE — Progress Notes (Signed)
Patient did not attend Self-esteem group.

## 2021-07-23 NOTE — Tx Team (Signed)
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)   Current Medications:  Current Facility-Administered Medications  Medication Dose Route Frequency Provider Last Rate Last Admin   alum & mag hydroxide-simeth (MAALOX/MYLANTA) 200-200-20 MG/5ML suspension 30 mL  30 mL Oral Q4H PRN Bobbitt, Shalon E, NP       cloNIDine (CATAPRES) tablet 0.1 mg  0.1 mg Oral QID Antonieta Pert, MD   0.1 mg at 07/23/21 0818   Followed by   Melene Muller ON 07/24/2021] cloNIDine (CATAPRES) tablet 0.1 mg  0.1 mg Oral BH-qamhs Antonieta Pert, MD       Followed by   Melene Muller ON 07/26/2021] cloNIDine (CATAPRES) tablet 0.1 mg  0.1 mg Oral QAC breakfast Antonieta Pert, MD       dicyclomine (BENTYL) tablet 20 mg  20 mg Oral Q6H PRN Antonieta Pert, MD       folic acid (FOLVITE) tablet 1 mg  1 mg Oral Daily Antonieta Pert, MD   1 mg at 07/23/21 0818   gabapentin (NEURONTIN) capsule 300 mg  300 mg Oral TID Antonieta Pert, MD   300 mg at 07/23/21 0818   hydrOXYzine (ATARAX/VISTARIL) tablet 50 mg  50 mg Oral TID PRN Antonieta Pert, MD   50 mg at 07/22/21 2114   loperamide (IMODIUM) capsule 2-4 mg  2-4 mg Oral PRN Antonieta Pert, MD       OLANZapine zydis (ZYPREXA) disintegrating tablet 10 mg  10 mg Oral Q8H PRN Lauro Franklin, MD       And   LORazepam (ATIVAN) tablet 1 mg  1 mg Oral PRN Lauro Franklin, MD       And   ziprasidone (GEODON) injection 20 mg  20 mg Intramuscular PRN Lauro Franklin, MD       LORazepam (ATIVAN) tablet 1 mg  1 mg Oral Q6H PRN Antonieta Pert, MD   1 mg at 07/22/21 1324   magnesium hydroxide (MILK OF MAGNESIA) suspension 30 mL  30 mL Oral Daily PRN Bobbitt, Shalon E, NP       methocarbamol (ROBAXIN)  tablet 500 mg  500 mg Oral Q8H PRN Antonieta Pert, MD       ondansetron (ZOFRAN-ODT) disintegrating tablet 8 mg  8 mg Oral Q6H PRN Antonieta Pert, MD   8 mg at 07/22/21 2115   thiamine tablet 100 mg  100 mg Oral Daily Antonieta Pert, MD   100 mg at 07/23/21 3335   PTA Medications: Medications Prior to Admission  Medication Sig Dispense Refill Last Dose   Buprenorphine HCl-Naloxone HCl 8-2 MG FILM Place 1 Film under the tongue daily. (Patient not taking: Reported on 07/19/2021)      doxycycline (VIBRAMYCIN) 100 MG capsule Take 1 capsule (100 mg total) by mouth 2 (two) times daily. (Patient not taking: No sig reported) 14 capsule 0    Hyoscyamine Sulfate SL (LEVSIN/SL) 0.125 MG SUBL Take 1-2 tablets by mouth every 4 hours prn abdominal cramping (Patient not taking: No sig reported) 30 tablet 0    ibuprofen (ADVIL) 200 MG tablet Take 200 mg by mouth every 6 (six) hours as needed for fever.      lisinopril (ZESTRIL) 10 MG tablet Take 10 mg by mouth daily.  ondansetron (ZOFRAN ODT) 4 MG disintegrating tablet Take 1 tablet (4 mg total) by mouth every 8 (eight) hours as needed for nausea or vomiting. (Patient not taking: No sig reported) 20 tablet 0    predniSONE (DELTASONE) 20 MG tablet Take 2 tablets (40 mg total) by mouth daily. (Patient not taking: No sig reported) 10 tablet 0     Patient Stressors:    Patient Strengths:    Treatment Modalities: Medication Management, Group therapy, Case management,  1 to 1 session with clinician, Psychoeducation, Recreational therapy.   Physician Treatment Plan for Primary Diagnosis: <principal problem not specified> Long Term Goal(s): Improvement in symptoms so as ready for discharge   Short Term Goals: Ability to identify changes in lifestyle to reduce recurrence of condition will improve Ability to verbalize feelings will improve Ability to disclose and discuss suicidal ideas Ability to demonstrate self-control will improve Ability to  identify and develop effective coping behaviors will improve Ability to maintain clinical measurements within normal limits will improve Ability to identify triggers associated with substance abuse/mental health issues will improve  Medication Management: Evaluate patient's response, side effects, and tolerance of medication regimen.  Therapeutic Interventions: 1 to 1 sessions, Unit Group sessions and Medication administration.  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)  Long Term Goal(s): Improvement in symptoms so as ready for discharge   Short Term Goals: Ability to identify changes in lifestyle to reduce recurrence of condition will improve Ability to verbalize feelings will improve Ability to disclose and discuss suicidal ideas Ability to demonstrate self-control will improve Ability to identify and develop effective coping behaviors will improve Ability to maintain clinical measurements within normal limits will improve Ability to identify triggers associated with substance abuse/mental health issues will improve     Medication Management: Evaluate patient's response, side effects, and tolerance of medication regimen.  Therapeutic Interventions: 1 to 1 sessions, Unit Group sessions and Medication administration.  Evaluation of Outcomes: Progressing   RN Treatment Plan for Primary Diagnosis: <principal problem not specified> Long Term Goal(s): Knowledge of disease and therapeutic regimen to maintain health will improve  Short Term Goals: Ability to remain free from injury will improve, Ability to participate in decision making will improve, Ability to verbalize feelings will improve, Ability to disclose and discuss suicidal ideas, and Ability to identify and develop effective coping behaviors will improve  Medication Management: RN will  administer medications as ordered by provider, will assess and evaluate patient's response and provide education to patient for prescribed medication. RN will report any adverse and/or side effects to prescribing provider.  Therapeutic Interventions: 1 on 1 counseling sessions, Psychoeducation, Medication administration, Evaluate responses to treatment, Monitor vital signs and CBGs as ordered, Perform/monitor CIWA, COWS, AIMS and Fall Risk screenings as ordered, Perform wound care treatments as ordered.  Evaluation of Outcomes: Progressing   LCSW Treatment Plan for Primary Diagnosis: <principal problem not specified> Long Term Goal(s): Safe transition to appropriate next level of care at discharge, Engage patient in therapeutic group addressing interpersonal concerns.  Short Term Goals: Engage patient in aftercare planning with referrals and resources, Increase social support, Increase emotional regulation, Facilitate acceptance of mental health diagnosis and concerns, Facilitate patient progression through stages of change regarding substance use diagnoses and concerns, Identify triggers associated with mental health/substance abuse issues, and Increase skills for wellness and recovery  Therapeutic Interventions: Assess for all discharge needs, 1 to  1 time with Child psychotherapist, Explore available resources and support systems, Assess for adequacy in community support network, Educate family and significant other(s) on suicide prevention, Complete Psychosocial Assessment, Interpersonal group therapy.  Evaluation of Outcomes: Progressing   Progress in Treatment: Attending groups: Yes. Participating in groups: Yes. Taking medication as prescribed: Yes. Toleration medication: Yes. Family/Significant other contact made: Yes, individual(s) contacted:  If consents are provided  Patient understands diagnosis: Yes. Discussing patient identified problems/goals with staff: Yes. Medical problems  stabilized or resolved: Yes. Denies suicidal/homicidal ideation: Yes. Issues/concerns per patient self-inventory: No.   New problem(s) identified: No, Describe:  None   New Short Term/Long Term Goal(s): medication stabilization, elimination of SI thoughts, development of comprehensive mental wellness plan.   Patient Goals: "To detox and go to treatment"  Discharge Plan or Barriers: Patient recently admitted. CSW will continue to follow and assess for appropriate referrals and possible discharge planning.   Reason for Continuation of Hospitalization: Depression Medication stabilization Suicidal ideation Withdrawal symptoms  Estimated Length of Stay: 3 to 5 days   Attendees: Patient: Carl Valencia 07/23/2021   Physician: Rhea Belton, DO 07/23/2021   Nursing:  07/23/2021   RN Care Manager: 07/23/2021   Social Worker: Melba Coon, LCSWA 07/23/2021   Recreational Therapist:  07/23/2021   Other:  07/23/2021   Other:  07/23/2021   Other: 07/23/2021     Scribe for Treatment Team: Aram Beecham, LCSWA 07/23/2021 10:59 AM

## 2021-07-23 NOTE — BHH Counselor (Signed)
Adult Comprehensive Assessment  Patient ID: [name], male   DOB: [birth year], 40 y.o.   MRN: [id number]      Summary/Recommendations:   Summary and Recommendations (to be completed by the evaluator): [name] is a 40 year old male who presented to [facility] ER with attempted suicide by heroin overdose.  Patient has a psychiatric history of polysubstance use disorder.  Patient reported historical use of heroin, crack, fentanyl, benzos and alcohol.  Patient is reported to be homeless with no social supports. Patient reports actively withdrawing from opioids.  Patient is currently not connected with any outpatient mental health or substance use providers. While here, Harshan would benefit from crisis stabilization, medication management, therapeutic milieu, and referrals for services.   **Social Work unable to get full assessment completed due to patient actively refusing assessment**  Odena Mcquaid E Jemiah Cuadra. 07/23/2021

## 2021-07-23 NOTE — [facility] Group Notes (Signed)
Due to COVID precautions on the unit, pts were given packet to complete individually.   Godson Pollan, LCSWA 

## 2021-07-23 NOTE — Progress Notes (Signed)
CSW spoke with patient regarding resources about rehab programs.  Patient denied resources except for [facility].  Patient requested that clothes be brought to hospital by his aunt, [name].    CSW called patient aunt and left a message about her ability to drop off clothes and belongings to patient.  No return call at this time.    Madalene Mickler, LCSW, LCAS Clincal Social Worker  [facility]

## 2021-07-23 NOTE — B[facility] Suicide Risk Assessment (Signed)
B[facility] INPATIENT:  Family/Significant Other Suicide Prevention Education  Suicide Prevention Education:  Education Completed; Carl Valencia,  (name of family member/significant other) has been identified by the patient as the family member/significant other with whom the patient will be residing, and identified as the person(s) who will aid the patient in the event of a mental health crisis (suicidal ideations/suicide attempt).  With written consent from the patient, the family member/significant other has been provided the following suicide prevention education, prior to the and/or following the discharge of the patient.  Patient aunt, Carl Valencia, spoke with CSW regarding collateral and safety planning. Carl Valencia shared that patient was staying with her 3 weeks ago but she had to "kick him out" due to a pattern and long history of stealing, lying and substance use.  Patient aunt discussed with this social worker that patient has a traumatic childhood where his mother left him at the age of 49.  Mother than came back but would not support him and at the age of 60 mother kicked him out and he was forced to live on his on.  Aunt reports that family tried to gain custody of patient but that his mother would not give up her rights. Patient has been in and out of jail and recently got out of prison. Aunt has allowed patient to live with him and has supported him to get treatment multiple times but that he always ends up stealing things (tools, car and other valuable things.) This most recent time when patient came out of prison, patient did not want to get a job or work.  Patient then took her car and was unable to be contacted.  Aunt had to call the police due to taking the car.  Aunt reports that a few days ago she received a text from patient stating that he may not be alive much longer. Aunt reports that patient can not come back to her place but she would really like him to get treatment.  Aunt reports that  patient has never been violent to anyone and to her knowledge does not have access to guns/weapons. Aunt reports that she is on a fixed income with social security and wants to be supportive but doesn't know how else to assist him.    The suicide prevention education provided includes the following: Suicide risk factors Suicide prevention and interventions National Suicide Hotline telephone number [facility] assessment telephone number [facility] Emergency Assistance 911 [facility] and/or Residential Mobile Crisis Unit telephone number  Request made of family/significant other to: Remove weapons (e.g., guns, rifles, knives), all items previously/currently identified as safety concern.   Remove drugs/medications (over-the-counter, prescriptions, illicit drugs), all items previously/currently identified as a safety concern.  The family member/significant other verbalizes understanding of the suicide prevention education information provided.  The family member/significant other agrees to remove the items of safety concern listed above.  Carl Valencia E Carl Valencia 07/23/2021, 2:10 PM

## 2021-07-23 NOTE — Progress Notes (Signed)
Psychoeducational Group Note  Date:  07/23/2021 Time:  2116  Group Topic/Focus:  Wrap-Up Group:   The focus of this group is to help patients review their daily goal of treatment and discuss progress on daily workbooks.  Participation Level: Did Not Attend  Participation Quality:  Not Applicable  Affect:  Not Applicable  Cognitive:  Not Applicable  Insight:  Not Applicable  Engagement in Group: Not Applicable  Additional Comments:  The patient did not attend group this evening.   Hazle Coca S 07/23/2021, 9:16 PM

## 2021-07-24 LAB — HEMOGLOBIN A1C
Hgb A1c MFr Bld: 5.4 % (ref 4.8–5.6)
Mean Plasma Glucose: 108.28 mg/dL

## 2021-07-24 LAB — LIPID PANEL
Cholesterol: 154 mg/dL (ref 0–200)
HDL: 43 mg/dL (ref >40)
LDL Cholesterol: 94 mg/dL (ref 0–99)
Total CHOL/HDL Ratio: 3.6 RATIO
Triglycerides: 83 mg/dL (ref <150)
VLDL: 17 mg/dL (ref 0–40)

## 2021-07-24 LAB — TSH: TSH: 1.542 u[IU]/mL (ref 0.350–4.500)

## 2021-07-24 MED ORDER — GABAPENTIN 300 MG PO CAPS: 300.0000 mg | ORAL_CAPSULE | Freq: Three times a day (TID) | ORAL | 0 refills | Status: AC

## 2021-07-24 NOTE — Progress Notes (Signed)
RN met with pt and reviewed pt's discharge instructions.  Pt verbalized understanding of discharge instructions and pt did not have any questions. RN reviewed and provided pt with a copy of SRA, AVS and Transition Record.  RN returned pt's belongings to pt.   Pt denied SI/HI/AVH and voiced no concerns.  Pt was appreciative of the care pt received at [facility].  Patient discharged to the lobby without incident.

## 2021-07-24 NOTE — BHH Suicide Risk Assessment (Signed)
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)   Total Time spent with patient: 20 minutes  Musculoskeletal: Strength & Muscle Tone: within normal limits Gait & Station: normal Patient leans: N/A  Psychiatric Specialty Exam  Presentation  General Appearance: Disheveled  Eye Contact:Fair  Speech:Normal Rate  Speech Volume:Normal  Handedness:Right   Mood and Affect  Mood:Anxious; Dysphoric; Irritable  Duration of Depression Symptoms: Greater than two weeks  Affect:Labile   Thought Process  Thought Processes:Coherent  Descriptions of Associations:Intact  Orientation:Full (Time, Place and Person)  Thought Content:Logical  History of Schizophrenia/Schizoaffective disorder:No  Duration of Psychotic Symptoms:No data recorded Hallucinations:No data recorded Ideas of Reference:None  Suicidal Thoughts:No data recorded Homicidal Thoughts:No data recorded  Sensorium  Memory:Immediate Poor; Recent Poor; Remote Poor  Judgment:Impaired  Insight:Lacking   Executive Functions  Concentration:Fair  Attention Span:Fair  Recall:Poor  Fund of Knowledge:Fair  Language:Fair   Psychomotor Activity  Psychomotor Activity: No data recorded  Assets  Assets:Desire for Improvement; Resilience   Sleep  Sleep: No data recorded  Physical Exam: Physical Exam Vitals and nursing note reviewed.  Constitutional:      Appearance: Normal appearance.  HENT:     Head: Normocephalic and atraumatic.  Pulmonary:     Effort: Pulmonary effort is normal.  Neurological:     General: No focal deficit present.     Mental Status: He is alert and oriented to person, place, and time.   Review of Systems  All other systems reviewed and are negative. Blood pressure 124/88, pulse 75, temperature 98.5 F (36.9  C), temperature source Oral, resp. rate 18, SpO2 100 %. There is no height or weight on file to calculate BMI.  Mental Status Per Nursing Assessment::   On Admission:  NA  Demographic Factors:  Male, Caucasian, Low socioeconomic status, Living alone, and Unemployed  Loss Factors: Financial problems/change in socioeconomic status  Historical Factors: Impulsivity  Risk Reduction Factors:   NA  Continued Clinical Symptoms:  Depression:   Comorbid alcohol abuse/dependence Hopelessness Impulsivity Alcohol/Substance Abuse/Dependencies  Cognitive Features That Contribute To Risk:  None    Suicide Risk:  Minimal: No identifiable suicidal ideation.  Patients presenting with no risk factors but with morbid ruminations; may be classified as minimal risk based on the severity of the depressive symptoms   Follow-up Information     Services, [facility] Follow up.   Why: Referral made Contact information: [address] [place] [address] [phone]         [facility]: Syringe Program. Call.   Why: Services are available on Wednesdays and Thursdays from 2pm-5pm.  [facility] supplies participants in [facility] with free, clean IV drug use supplies and naloxone, an opioid antagonist capable of reversing overdose. Call if you would like supplies delivered to your location. Contact information: Call or Text Joselyn Glassman at [phone] [facility] [address] [place], [place] [address]        [facility] Call.   Why: Call or go to this location for opioid use disorder treatment.  They provide methadone, intensive outpatient substance use counseling and other addiction services. Walk in hours are from 6am-11am Monday through Friday. Contact information: [address] [place], [place] [address] Office: [phone]                Plan Of Care/Follow-up recommendations:  Activity:  ad lib  Antonieta Pert, MD 07/24/2021, 9:44 AM
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
## 2021-07-24 NOTE — Progress Notes (Signed)
Pt concerned about trying to get into a program. Pt worried due to being homeless , but feels better today since coming in the hospital    07/24/21 0000  Psych Admission Type (Psych Patients Only)  Admission Status Involuntary  Psychosocial Assessment  Patient Complaints Anxiety;Worrying  Eye Contact Brief  Facial Expression Flat  Affect Apathetic  Speech Soft;Slow  Interaction Minimal  Motor Activity Slow  Appearance/Hygiene Body odor;Disheveled;Poor hygiene  Behavior Characteristics Anxious  Mood Depressed;Sad  Thought Process  Coherency WDL  Content WDL  Delusions None reported or observed  Perception WDL  Hallucination None reported or observed  Judgment Limited  Confusion None  Danger to Self  Current suicidal ideation? Denies  Self-Injurious Behavior No self-injurious ideation or behavior indicators observed or expressed   Agreement Not to Harm Self No  Danger to Others  Danger to Others None reported or observed

## 2021-07-24 NOTE — Progress Notes (Signed)
  [facility] Adult Case Management Discharge Plan :  Will you be returning to the same living situation after discharge:  Yes,  Home At discharge, do you have transportation home?: No. Will need to utilize safe transport Do you have the ability to pay for your medications: No. Patient provided resources for patients without means to pay.  Release of information consent forms completed and in the chart;  Patient's signature needed at discharge.  Patient to Follow up at:  Follow-up Information     [facility]: Syringe Program. Call.   Why: Services are available on Wednesdays and Thursdays from 2pm-5pm.  [facility] supplies participants in [facility] with free, clean IV drug use supplies and naloxone, an opioid antagonist capable of reversing overdose. Call if you would like supplies delivered to your location. Contact information: Call or Text Joselyn Glassman at [phone] [facility] [address] [place], [place] [address]        [facility]. Call.   Why: Call or go to this location for opioid use disorder treatment.  They provide methadone, intensive outpatient substance use counseling and other addiction services. Walk in hours are from 6am-11am Monday through Friday. Contact information: [address] [place], [place] [address] Office: [phone]                Next level of care provider has access to [facility] Link:no  Safety Planning and Suicide Prevention discussed: Yes,  Iva Boop     Has patient been referred to the Quitline?: Patient refused referral  Patient has been referred for addiction treatment: Yes  Nolberto Cheuvront E Etta Gassett, LCSW 07/24/2021, 9:56 AM

## 2021-09-10 ENCOUNTER — Other Ambulatory Visit: Payer: Self-pay

## 2021-09-10 ENCOUNTER — Encounter (HOSPITAL_COMMUNITY): Payer: Self-pay | Admitting: Emergency Medicine

## 2021-09-10 ENCOUNTER — Emergency Department (HOSPITAL_COMMUNITY)
Admission: EM | Admit: 2021-09-10 | Discharge: 2021-09-10 | Disposition: A | Discharge status: Home / Self Care | Payer: Self-pay | Attending: Emergency Medicine | Admitting: Emergency Medicine

## 2021-09-10 ENCOUNTER — Emergency Department (HOSPITAL_COMMUNITY): Payer: Self-pay

## 2021-09-10 DIAGNOSIS — I10 Essential (primary) hypertension: Secondary | ICD-10-CM | POA: Insufficient documentation

## 2021-09-10 DIAGNOSIS — M25569 Pain in unspecified knee: Secondary | ICD-10-CM

## 2021-09-10 DIAGNOSIS — F17210 Nicotine dependence, cigarettes, uncomplicated: Secondary | ICD-10-CM | POA: Insufficient documentation

## 2021-09-10 DIAGNOSIS — X58XXXA Exposure to other specified factors, initial encounter: Secondary | ICD-10-CM | POA: Insufficient documentation

## 2021-09-10 DIAGNOSIS — M2392 Unspecified internal derangement of left knee: Secondary | ICD-10-CM | POA: Insufficient documentation
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
## 2021-09-10 NOTE — ED Triage Notes (Signed)
EMS reports pt felt a pop in his left knee when stepping off a curb. Unable to bear weight. No deformity noted.

## 2021-09-25 ENCOUNTER — Emergency Department (HOSPITAL_BASED_OUTPATIENT_CLINIC_OR_DEPARTMENT_OTHER)
Admission: EM | Admit: 2021-09-25 | Discharge: 2021-09-25 | Disposition: A | Discharge status: Home / Self Care | Payer: Self-pay | Attending: Emergency Medicine | Admitting: Emergency Medicine

## 2021-09-25 ENCOUNTER — Encounter (HOSPITAL_BASED_OUTPATIENT_CLINIC_OR_DEPARTMENT_OTHER): Payer: Self-pay | Admitting: Emergency Medicine

## 2021-09-25 ENCOUNTER — Other Ambulatory Visit: Payer: Self-pay

## 2021-09-25 ENCOUNTER — Encounter (HOSPITAL_BASED_OUTPATIENT_CLINIC_OR_DEPARTMENT_OTHER): Payer: Self-pay

## 2021-09-25 DIAGNOSIS — F1914 Other psychoactive substance abuse with psychoactive substance-induced mood disorder: Secondary | ICD-10-CM | POA: Insufficient documentation

## 2021-09-25 DIAGNOSIS — F17210 Nicotine dependence, cigarettes, uncomplicated: Secondary | ICD-10-CM | POA: Insufficient documentation

## 2021-09-25 DIAGNOSIS — R45851 Suicidal ideations: Secondary | ICD-10-CM | POA: Insufficient documentation

## 2021-09-25 DIAGNOSIS — F1910 Other psychoactive substance abuse, uncomplicated: Secondary | ICD-10-CM

## 2021-09-25 DIAGNOSIS — I10 Essential (primary) hypertension: Secondary | ICD-10-CM | POA: Insufficient documentation

## 2021-09-25 DIAGNOSIS — Z5900 Homelessness unspecified: Secondary | ICD-10-CM | POA: Insufficient documentation

## 2021-09-25 DIAGNOSIS — L0291 Cutaneous abscess, unspecified: Secondary | ICD-10-CM

## 2021-09-25 DIAGNOSIS — Z20822 Contact with and (suspected) exposure to covid-19: Secondary | ICD-10-CM | POA: Insufficient documentation

## 2021-09-25 DIAGNOSIS — F439 Reaction to severe stress, unspecified: Secondary | ICD-10-CM | POA: Insufficient documentation

## 2021-09-25 LAB — CBC WITH DIFFERENTIAL/PLATELET
Abs Immature Granulocytes: 0 10*3/uL (ref 0.00–0.07)
Basophils Absolute: 0 10*3/uL (ref 0.0–0.1)
Basophils Relative: 0 %
Eosinophils Absolute: 0.1 10*3/uL (ref 0.0–0.5)
Eosinophils Relative: 2 %
HCT: 38 % — ABNORMAL LOW (ref 39.0–52.0)
Hemoglobin: 13 g/dL (ref 13.0–17.0)
Immature Granulocytes: 0 %
Lymphocytes Relative: 36 %
Lymphs Abs: 1.3 10*3/uL (ref 0.7–4.0)
MCH: 29.2 pg (ref 26.0–34.0)
MCHC: 34.2 g/dL (ref 30.0–36.0)
MCV: 85.4 fL (ref 80.0–100.0)
Monocytes Absolute: 0.5 10*3/uL (ref 0.1–1.0)
Monocytes Relative: 12 %
Neutro Abs: 1.8 10*3/uL (ref 1.7–7.7)
Neutrophils Relative %: 50 %
Platelets: 200 10*3/uL (ref 150–400)
RBC: 4.45 MIL/uL (ref 4.22–5.81)
RDW: 12.5 % (ref 11.5–15.5)
WBC: 3.6 10*3/uL — ABNORMAL LOW (ref 4.0–10.5)
nRBC: 0 % (ref 0.0–0.2)

## 2021-09-25 LAB — RAPID URINE DRUG SCREEN, HOSP PERFORMED
Amphetamines: POSITIVE — AB
Barbiturates: NOT DETECTED
Benzodiazepines: NOT DETECTED
Cocaine: NOT DETECTED
Opiates: POSITIVE — AB
Tetrahydrocannabinol: NOT DETECTED

## 2021-09-25 LAB — RESP PANEL BY RT-PCR (FLU A&B, COVID) ARPGX2
Influenza A by PCR: NEGATIVE
Influenza B by PCR: NEGATIVE
SARS Coronavirus 2 by RT PCR: NEGATIVE

## 2021-09-25 LAB — COMPREHENSIVE METABOLIC PANEL
ALT: 18 U/L (ref 0–44)
AST: 20 U/L (ref 15–41)
Albumin: 3.9 g/dL (ref 3.5–5.0)
Alkaline Phosphatase: 110 U/L (ref 38–126)
Anion gap: 9 (ref 5–15)
BUN: 17 mg/dL (ref 6–20)
CO2: 24 mmol/L (ref 22–32)
Calcium: 9.8 mg/dL (ref 8.9–10.3)
Chloride: 105 mmol/L (ref 98–111)
Creatinine, Ser: 0.66 mg/dL (ref 0.61–1.24)
GFR, Estimated: >60 mL/min (ref >60)
Glucose, Bld: 115 mg/dL — ABNORMAL HIGH (ref 70–99)
Potassium: 3.8 mmol/L (ref 3.5–5.1)
Sodium: 138 mmol/L (ref 135–145)
Total Bilirubin: 0.7 mg/dL (ref 0.3–1.2)
Total Protein: 6.9 g/dL (ref 6.5–8.1)

## 2021-09-25 LAB — ETHANOL: Alcohol, Ethyl (B): <10 mg/dL (ref <10)

## 2021-09-25 MED ORDER — DICYCLOMINE HCL 20 MG PO TABS
20.0000 mg | ORAL_TABLET | Freq: Four times a day (QID) | ORAL | Status: DC | PRN
  Filled 2021-09-25: qty 1

## 2021-09-25 MED ORDER — CLONIDINE HCL 0.1 MG PO TABS: 0.1000 mg | ORAL_TABLET | Freq: Two times a day (BID) | ORAL | Status: DC

## 2021-09-25 MED ORDER — HYDROXYZINE HCL 25 MG PO TABS: 25.0000 mg | ORAL_TABLET | Freq: Four times a day (QID) | ORAL | Status: DC | PRN

## 2021-09-25 MED ORDER — LORAZEPAM 1 MG PO TABS
0.0000 mg | ORAL_TABLET | Freq: Four times a day (QID) | ORAL | Status: DC
  Administered 2021-09-25: 2 mg via ORAL
  Filled 2021-09-25: qty 2

## 2021-09-25 MED ORDER — THIAMINE HCL 100 MG/ML IJ SOLN: 100.0000 mg | Freq: Every day | INTRAMUSCULAR | Status: DC

## 2021-09-25 MED ORDER — LORAZEPAM 2 MG/ML IJ SOLN: 0.0000 mg | Freq: Two times a day (BID) | INTRAMUSCULAR | Status: DC

## 2021-09-25 MED ORDER — METHOCARBAMOL 500 MG PO TABS: 500.0000 mg | ORAL_TABLET | Freq: Three times a day (TID) | ORAL | Status: DC | PRN

## 2021-09-25 MED ORDER — CLONIDINE HCL 0.1 MG PO TABS: 0.1000 mg | ORAL_TABLET | Freq: Four times a day (QID) | ORAL | Status: DC

## 2021-09-25 MED ORDER — LORAZEPAM 2 MG/ML IJ SOLN: 0.0000 mg | Freq: Four times a day (QID) | INTRAMUSCULAR | Status: DC

## 2021-09-25 MED ORDER — LORAZEPAM 1 MG PO TABS: 0.0000 mg | ORAL_TABLET | Freq: Two times a day (BID) | ORAL | Status: DC

## 2021-09-25 MED ORDER — ONDANSETRON 4 MG PO TBDP
4.0000 mg | ORAL_TABLET | Freq: Four times a day (QID) | ORAL | Status: DC | PRN
  Administered 2021-09-25: 4 mg via ORAL
  Filled 2021-09-25: qty 1

## 2021-09-25 MED ORDER — DOXYCYCLINE HYCLATE 100 MG PO TABS
100.0000 mg | ORAL_TABLET | Freq: Two times a day (BID) | ORAL | Status: DC
  Administered 2021-09-25 (×2): 100 mg via ORAL
  Filled 2021-09-25 (×2): qty 1

## 2021-09-25 MED ORDER — LOPERAMIDE HCL 2 MG PO CAPS
2.0000 mg | ORAL_CAPSULE | ORAL | Status: DC | PRN
Start: 2021-09-25 — End: 2021-09-26

## 2021-09-25 MED ORDER — CLONIDINE HCL 0.1 MG PO TABS
0.1000 mg | ORAL_TABLET | Freq: Every day | ORAL | Status: DC
Start: 2021-09-30 — End: 2021-09-26

## 2021-09-25 MED ORDER — THIAMINE HCL 100 MG PO TABS
100.0000 mg | ORAL_TABLET | Freq: Every day | ORAL | Status: DC
  Administered 2021-09-25: 100 mg via ORAL
  Filled 2021-09-25: qty 1

## 2021-09-25 MED ORDER — DOXYCYCLINE HYCLATE 100 MG PO CAPS
100.0000 mg | ORAL_CAPSULE | Freq: Two times a day (BID) | ORAL | 0 refills | Status: AC
Start: 2021-09-25 — End: ?
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
## 2021-09-25 NOTE — ED Notes (Signed)
Pt wanded by security. 

## 2021-09-25 NOTE — ED Notes (Signed)
Per Christina Hussami LCSW, pt. cleared for discharge.
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
## 2021-09-25 NOTE — ED Notes (Signed)
Pt changed into psych scrubs. All belongings stored at nurse's station in patient belonging bag.

## 2021-09-25 NOTE — ED Notes (Signed)
Called [facility] at 5:15 to see when Patient would be evaluated they said not until after 7:00 pm that there were only 3 of them and that they have to take walk ins first. And that they had 7 walk in to evaluate first.
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
## 2021-09-25 NOTE — ED Notes (Signed)
Bag containing pt's belongings returned to him.

## 2021-09-25 NOTE — ED Notes (Signed)
Pt. Speaking with behavioral health via telehealth.
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
## 2021-09-25 NOTE — ED Notes (Signed)
TTS called tried to talk to pt but said he was to sleepy,  will call back in about 30 min

## 2021-09-30 ENCOUNTER — Inpatient Hospital Stay: Admit: 2021-09-30 | Discharge: 2021-09-30 | Attending: Emergency Medicine

## 2021-09-30 NOTE — ED Provider Notes (Signed)
Not seen by me

## 2021-09-30 NOTE — ED Notes (Signed)
Pt not in room

## 2021-09-30 NOTE — ED Notes (Signed)
 Pt states he did the hot chip challenge and it freaked him out, states he fell off of the porch 2 days ago.  Pt states he has a knot on his inner right wrist that has increasingly gotten bigger and more painful

## 2021-10-06 ENCOUNTER — Emergency Department: Admit: 2021-10-07 | Payer: MEDICAID

## 2021-10-06 ENCOUNTER — Inpatient Hospital Stay
Admit: 2021-10-06 | Discharge: 2021-10-07 | Disposition: A | Discharge status: Home / Self Care | Payer: MEDICAID | Attending: Emergency Medicine

## 2021-10-06 NOTE — ED Notes (Signed)
Pain in lower abdomen that began early this morning. States history of abdominal surgery 2012 where had to have some intestines removed and sewn back together.  Does have hernia. Reports passing dark red blood in stools this morning. Denies nausea or vomiting
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
## 2021-10-07 LAB — URINALYSIS W/ RFLX MICROSCOPIC
Bilirubin, Urine: NEGATIVE
Bilirubin: NEGATIVE
Blood, Urine: NEGATIVE
Blood: NEGATIVE
Glucose, Ur: NEGATIVE mg/dL
Glucose: NEGATIVE mg/dL
Ketone: NEGATIVE mg/dL
Ketones, Urine: NEGATIVE mg/dL
Leukocyte Esterase, Urine: NEGATIVE
Leukocyte Esterase: NEGATIVE
Nitrite, Urine: NEGATIVE
Nitrites: NEGATIVE
Protein, UA: NEGATIVE mg/dL
Protein: NEGATIVE mg/dL
Specific Gravity, UA: 1.013 (ref 1.005–1.030)
Specific gravity: 1.013 (ref 1.005–1.030)
Urobilinogen, UA, POCT: 0.2 EU/dL (ref 0.2–1.0)
Urobilinogen: 0.2 EU/dL (ref 0.2–1.0)
pH (UA): 6.5 (ref 5.0–8.0)
pH, UA: 6.5 (ref 5.0–8.0)

## 2021-10-07 LAB — BASIC METABOLIC PANEL
Anion Gap: 5 mmol/L (ref 3.0–18.0)
BUN: 9 mg/dL (ref 7–18)
Bun/Cre Ratio: 12 (ref 12–20)
CO2: 27 mmol/L (ref 21–32)
Calcium: 9.3 mg/dL (ref 8.5–10.1)
Chloride: 106 mmol/L (ref 100–111)
Creatinine: 0.78 mg/dL (ref 0.60–1.30)
ESTIMATED GLOMERULAR FILTRATION RATE: >60 mL/min/{1.73_m2} (ref >60)
Glucose: 119 mg/dL — ABNORMAL HIGH (ref 74–99)
Potassium: 3.2 mmol/L — ABNORMAL LOW (ref 3.5–5.5)
Sodium: 138 mmol/L (ref 136–145)

## 2021-10-07 LAB — HEPATIC FUNCTION PANEL
A-G Ratio: 0.9 (ref 0.8–1.7)
ALT (SGPT): 25 U/L (ref 16–61)
ALT: 25 U/L (ref 16–61)
AST (SGOT): 13 U/L (ref 10–38)
AST: 13 U/L (ref 10–38)
Albumin/Globulin Ratio: 0.9 (ref 0.8–1.7)
Albumin: 3.3 g/dL — ABNORMAL LOW (ref 3.4–5.0)
Albumin: 3.3 g/dL — ABNORMAL LOW (ref 3.4–5.0)
Alk. phosphatase: 102 U/L (ref 45–117)
Alkaline Phosphatase: 102 U/L (ref 45–117)
Bilirubin, Direct: 0.1 mg/dL (ref 0.0–0.2)
Bilirubin, direct: 0.1 mg/dL (ref 0.0–0.2)
Bilirubin, total: 0.6 mg/dL (ref 0.2–1.0)
Globulin: 3.8 g/dL (ref 2.0–4.0)
Globulin: 3.8 g/dL (ref 2.0–4.0)
Protein, total: 7.1 g/dL (ref 6.4–8.2)
Total Bilirubin: 0.6 mg/dL (ref 0.2–1.0)
Total Protein: 7.1 g/dL (ref 6.4–8.2)

## 2021-10-07 LAB — CBC WITH AUTO DIFFERENTIAL
Basophils %: 0 % (ref 0–2)
Basophils Absolute: 0 10*3/uL (ref 0.0–0.1)
Eosinophils %: 2 % (ref 0–5)
Eosinophils Absolute: 0.2 10*3/uL (ref 0.0–0.4)
Granulocyte Absolute Count: 0 10*3/uL (ref 0.00–0.04)
Hematocrit: 39.6 % (ref 36.0–48.0)
Hemoglobin: 13.5 g/dL (ref 13.0–16.0)
Immature Granulocytes: 0 % (ref 0–0.5)
Lymphocytes %: 33 % (ref 21–52)
Lymphocytes Absolute: 2.9 10*3/uL (ref 0.9–3.6)
MCH: 29 PG (ref 24.0–34.0)
MCHC: 34.1 g/dL (ref 31.0–37.0)
MCV: 85.2 FL (ref 78.0–100.0)
MPV: 9 FL — ABNORMAL LOW (ref 9.2–11.8)
Monocytes %: 9 % (ref 3–10)
Monocytes Absolute: 0.7 10*3/uL (ref 0.05–1.2)
NRBC Absolute: 0 10*3/uL (ref 0.00–0.01)
Neutrophils %: 56 % (ref 40–73)
Neutrophils Absolute: 4.8 10*3/uL (ref 1.8–8.0)
Nucleated RBCs: 0 PER 100 WBC
Platelets: 223 10*3/uL (ref 135–420)
RBC: 4.65 M/uL (ref 4.35–5.65)
RDW: 13 % (ref 11.6–14.5)
WBC: 8.7 10*3/uL (ref 4.6–13.2)

## 2021-10-07 LAB — LIPASE
Lipase: 167 U/L (ref 73–393)
Lipase: 167 U/L (ref 73–393)

## 2021-10-07 LAB — CBC WITH AUTOMATED DIFF
ABS. BASOPHILS: 0 10*3/uL (ref 0.0–0.1)
ABS. EOSINOPHILS: 0.2 10*3/uL (ref 0.0–0.4)
ABS. IMM. GRANS.: 0 10*3/uL (ref 0.00–0.04)
ABS. LYMPHOCYTES: 2.9 10*3/uL (ref 0.9–3.6)
ABS. MONOCYTES: 0.7 10*3/uL (ref 0.05–1.2)
ABS. NEUTROPHILS: 4.8 10*3/uL (ref 1.8–8.0)
ABSOLUTE NRBC: 0 10*3/uL (ref 0.00–0.01)
BASOPHILS: 0 % (ref 0–2)
EOSINOPHILS: 2 % (ref 0–5)
HCT: 39.6 % (ref 36.0–48.0)
HGB: 13.5 g/dL (ref 13.0–16.0)
IMMATURE GRANULOCYTES: 0 % (ref 0–0.5)
LYMPHOCYTES: 33 % (ref 21–52)
MCH: 29 PG (ref 24.0–34.0)
MCHC: 34.1 g/dL (ref 31.0–37.0)
MCV: 85.2 FL (ref 78.0–100.0)
MONOCYTES: 9 % (ref 3–10)
MPV: 9 FL — ABNORMAL LOW (ref 9.2–11.8)
NEUTROPHILS: 56 % (ref 40–73)
NRBC: 0 PER 100 WBC
PLATELET: 223 10*3/uL (ref 135–420)
RBC: 4.65 M/uL (ref 4.35–5.65)
RDW: 13 % (ref 11.6–14.5)
WBC: 8.7 10*3/uL (ref 4.6–13.2)

## 2021-10-07 LAB — METABOLIC PANEL, BASIC
Anion gap: 5 mmol/L (ref 3.0–18.0)
BUN/Creatinine ratio: 12 (ref 12–20)
BUN: 9 mg/dL (ref 7–18)
CO2: 27 mmol/L (ref 21–32)
Calcium: 9.3 mg/dL (ref 8.5–10.1)
Chloride: 106 mmol/L (ref 100–111)
Creatinine: 0.78 mg/dL (ref 0.60–1.30)
Glucose: 119 mg/dL — ABNORMAL HIGH (ref 74–99)
Potassium: 3.2 mmol/L — ABNORMAL LOW (ref 3.5–5.5)
Sodium: 138 mmol/L (ref 136–145)
eGFR: >60 mL/min/{1.73_m2} (ref >60)

## 2021-10-07 MED ORDER — TRAMADOL 50 MG TAB
50 mg | ORAL_TABLET | Freq: Four times a day (QID) | ORAL | 0 refills | Status: AC | PRN
Start: 2021-10-07 — End: 2021-10-14

## 2021-10-07 MED ORDER — HYDROMORPHONE 1 MG/ML INJECTION SOLUTION
1 mg/mL | INTRAMUSCULAR | Status: AC
Start: 2021-10-07 — End: 2021-10-06
  Administered 2021-10-07: 03:00:00 via INTRAVENOUS

## 2021-10-07 MED ORDER — ONDANSETRON 4 MG TAB, RAPID DISSOLVE
4 mg | ORAL_TABLET | Freq: Three times a day (TID) | ORAL | 0 refills | Status: AC | PRN
Start: 2021-10-07 — End: 2021-10-17

## 2021-10-07 MED ORDER — SODIUM CHLORIDE 0.9 % IV
Freq: Once | INTRAVENOUS | Status: AC
Start: 2021-10-07 — End: 2021-10-06
  Administered 2021-10-07: 03:00:00 via INTRAVENOUS

## 2021-10-07 MED ORDER — DICYCLOMINE 10 MG/5 ML ORAL SOLN
10 mg/5 mL | Freq: Four times a day (QID) | ORAL | 0 refills | Status: AC | PRN
Start: 2021-10-07 — End: 2021-10-14

## 2021-10-07 MED ORDER — ONDANSETRON (PF) 4 MG/2 ML INJECTION
4 mg/2 mL | INTRAMUSCULAR | Status: AC
Start: 2021-10-07 — End: 2021-10-06
  Administered 2021-10-07: 03:00:00 via INTRAVENOUS

## 2021-10-07 MED ORDER — IOPAMIDOL 76 % IV SOLN
76 % | Freq: Once | INTRAVENOUS | Status: AC
Start: 2021-10-07 — End: 2021-10-06
  Administered 2021-10-07: 04:00:00 via INTRAVENOUS

## 2021-10-07 MED FILL — HYDROMORPHONE 1 MG/ML INJECTION SOLUTION: 1 mg/mL | INTRAMUSCULAR | Qty: 1

## 2021-10-07 MED FILL — ISOVUE-370  76 % INTRAVENOUS SOLUTION: 370 mg iodine /mL (76 %) | INTRAVENOUS | Qty: 100

## 2021-10-07 MED FILL — SODIUM CHLORIDE 0.9 % IV: INTRAVENOUS | Qty: 1000

## 2021-10-07 MED FILL — ONDANSETRON (PF) 4 MG/2 ML INJECTION: 4 mg/2 mL | INTRAMUSCULAR | Qty: 2

## 2021-10-07 NOTE — ED Notes (Signed)
Patient states pain medication has worn off and abdominal pain has returned. Dr. Laural Benes made aware.

## 2021-10-07 NOTE — ED Notes (Signed)
I have reviewed discharge instructions with the patient.  The patient verbalized understanding.

## 2021-10-17 ENCOUNTER — Inpatient Hospital Stay
Admit: 2021-10-17 | Discharge: 2021-10-17 | Disposition: A | Discharge status: Home / Self Care | Payer: MEDICAID | Attending: Emergency Medicine

## 2021-10-17 MED ORDER — CYCLOBENZAPRINE 10 MG TAB
10 mg | ORAL_TABLET | Freq: Three times a day (TID) | ORAL | 0 refills | Status: AC | PRN
Start: 2021-10-17 — End: ?

## 2021-10-17 MED ORDER — IBUPROFEN 600 MG TAB
600 mg | ORAL_TABLET | Freq: Four times a day (QID) | ORAL | 0 refills | Status: DC | PRN
Start: 2021-10-17 — End: 2022-01-08

## 2021-10-17 MED ADMIN — cyclobenzaprine (FLEXERIL) tablet 10 mg: ORAL | @ 11:00:00 | NDC 69097084607

## 2021-10-17 MED ADMIN — ibuprofen (MOTRIN) tablet 800 mg: ORAL | @ 11:00:00 | NDC 00904585361

## 2021-10-17 MED FILL — IBUPROFEN 400 MG TAB: 400 mg | ORAL | Qty: 2

## 2021-10-17 MED FILL — CYCLOBENZAPRINE 10 MG TAB: 10 mg | ORAL | Qty: 1
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
## 2021-10-17 NOTE — ED Provider Notes (Signed)
Pt c/o mid back pain,w swelling, says h/o fx w frequent pain to same area. Pain x one day, says was tossing heavy bags when slipped and fell onto back.  No head injury, no loc.  Says was mild fall, thinks pain from twisting on way to ground.  No fever. No abd or chest pain. No urinary or bowel changes. No pain prior to fall.  Was on ultram for abd pain last week but only one tab left, stopped needing it.     No leg pain or swelling.  No meds for pai pta.    No fever/chills.        History reviewed. No pertinent past medical history.    Past Surgical History:   Procedure Laterality Date    HX GI           History reviewed. No pertinent family history.    Social History     Socioeconomic History    Marital status: SINGLE     Spouse name: Not on file    Number of children: Not on file    Years of education: Not on file    Highest education level: Not on file   Occupational History    Not on file   Tobacco Use    Smoking status: Every Day     Packs/day: 0.50     Types: Cigarettes    Smokeless tobacco: Never   Substance and Sexual Activity    Alcohol use: Not Currently    Drug use: Not on file    Sexual activity: Not on file   Other Topics Concern    Not on file   Social History Narrative    Not on file     Social Determinants of Health     Financial Resource Strain: Not on file   Food Insecurity: Not on file   Transportation Needs: Not on file   Physical Activity: Not on file   Stress: Not on file   Social Connections: Not on file   Intimate Partner Violence: Not on file   Housing Stability: Not on file         ALLERGIES: Pcn [penicillins] and Sulfa (sulfonamide antibiotics)    Review of Systems   Constitutional:  Negative for diaphoresis and fever.   HENT:  Negative for congestion.    Respiratory:  Negative for cough and shortness of breath.    Cardiovascular:  Negative for chest pain.   Gastrointestinal:  Negative for abdominal pain and nausea.   Musculoskeletal:  Positive for back pain.   Skin:  Negative for rash.    Neurological:  Negative for dizziness, weakness and numbness.   All other systems reviewed and are negative.    Vitals:    10/17/21 0617   BP: (!) 141/100   Pulse: 76   Resp: 18   Temp: 98.8 ??F (37.1 ??C)   SpO2: 100%   Weight: 77.1 kg (170 lb)   Height: 5\' 9"  (1.753 m)            Physical Exam  Vitals and nursing note reviewed.   Constitutional:       Appearance: He is well-developed.   HENT:      Head: Normocephalic and atraumatic.   Eyes:      Conjunctiva/sclera: Conjunctivae normal.   Cardiovascular:      Rate and Rhythm: Normal rate and regular rhythm.   Pulmonary:      Effort: Pulmonary effort is normal.      Breath  sounds: No wheezing.   Abdominal:      Palpations: Abdomen is soft.      Tenderness: There is no abdominal tenderness.   Musculoskeletal:         General: Tenderness (mid thoracic ttp w small area 1cm on non erythematus swelling, no ecccymosis.  no step off. no warmth. no drainage. .  fromx 4.  nvi x 4.) present.      Cervical back: Normal range of motion.   Skin:     General: Skin is warm and dry.      Capillary Refill: Capillary refill takes less than 2 seconds.      Findings: No rash.   Neurological:      Mental Status: He is alert and oriented to person, place, and time.   Psychiatric:         Mood and Affect: Mood normal.        MDM         Procedures    Vitals:  Patient Vitals for the past 12 hrs:   Temp Pulse Resp BP SpO2   10/17/21 0617 98.8 ??F (37.1 ??C) 76 18 (!) 141/100 100 %         Medications ordered:   Medications   ibuprofen (MOTRIN) tablet 800 mg (800 mg Oral Given 10/17/21 8295)   cyclobenzaprine (FLEXERIL) tablet 10 mg (10 mg Oral Given 10/17/21 6213)         Lab findings:  No results found for this or any previous visit (from the past 12 hour(s)).        X-Ray, CT or other radiology findings or impressions:  XR SPINE THORAC 2 V    (Results Pending)             Progress notes, Consult notes or additional Procedure notes:   No emc. Not c/w cord compression/cauda  equina/fracture/epidural abscess/acute abdomen/dvt/aaa. Stable for discharge and close follow-up.  Patient agrees w discharge plan and verbalizes understanding of detailed return inst given. No indication for urgent mri or other imaging.        Diagnosis:   1. Back strain, initial encounter        Disposition: home    Follow-up Information       Follow up With Specialties Details Why Contact Info    [facility] EMERGENCY DEPT Emergency Medicine Go to  As needed, If symptoms worsen [address]  [place] [address]  [phone]    Mikle Bosworth, NP Nurse Practitioner Schedule an appointment as soon as possible for a visit in 2 days  [address]  [address]  [place] [address]  [phone]               Patient's Medications   Start Taking    CYCLOBENZAPRINE (FLEXERIL) 10 MG TABLET    Take 1 Tablet by mouth three (3) times daily as needed for Muscle Spasm(s).    IBUPROFEN (MOTRIN) 600 MG TABLET    Take 1 Tablet by mouth every six (6) hours as needed for Pain.   Continue Taking    No medications on file   These Medications have changed    No medications on file   Stop Taking    ONDANSETRON (ZOFRAN ODT) 4 MG DISINTEGRATING TABLET    Take 1 Tablet by mouth every eight (8) hours as needed for Nausea or Vomiting.

## 2021-11-13 ENCOUNTER — Encounter: Attending: Orthopaedic Surgery

## 2021-12-27 ENCOUNTER — Emergency Department (HOSPITAL_BASED_OUTPATIENT_CLINIC_OR_DEPARTMENT_OTHER)
Admission: EM | Admit: 2021-12-27 | Discharge: 2021-12-28 | Disposition: A | Discharge status: Home / Self Care | Payer: Self-pay | Attending: Emergency Medicine | Admitting: Emergency Medicine

## 2021-12-27 DIAGNOSIS — T402X1A Poisoning by other opioids, accidental (unintentional), initial encounter: Secondary | ICD-10-CM | POA: Insufficient documentation

## 2021-12-27 DIAGNOSIS — T40601A Poisoning by unspecified narcotics, accidental (unintentional), initial encounter: Secondary | ICD-10-CM | POA: Insufficient documentation

## 2021-12-27 DIAGNOSIS — M79602 Pain in left arm: Secondary | ICD-10-CM | POA: Insufficient documentation

## 2021-12-27 DIAGNOSIS — R0789 Other chest pain: Secondary | ICD-10-CM | POA: Insufficient documentation

## 2021-12-28 ENCOUNTER — Encounter (HOSPITAL_BASED_OUTPATIENT_CLINIC_OR_DEPARTMENT_OTHER): Payer: Self-pay | Admitting: Radiology

## 2021-12-28 ENCOUNTER — Other Ambulatory Visit: Payer: Self-pay

## 2021-12-28 ENCOUNTER — Emergency Department (HOSPITAL_BASED_OUTPATIENT_CLINIC_OR_DEPARTMENT_OTHER): Payer: Self-pay

## 2021-12-28 LAB — CBC
HCT: 43 % (ref 39.0–52.0)
Hemoglobin: 15.4 g/dL (ref 13.0–17.0)
MCH: 31.3 pg (ref 26.0–34.0)
MCHC: 35.8 g/dL (ref 30.0–36.0)
MCV: 87.4 fL (ref 80.0–100.0)
Platelets: 173 10*3/uL (ref 150–400)
RBC: 4.92 MIL/uL (ref 4.22–5.81)
RDW: 12.8 % (ref 11.5–15.5)
WBC: 8.9 10*3/uL (ref 4.0–10.5)
nRBC: 0 % (ref 0.0–0.2)

## 2021-12-28 LAB — BASIC METABOLIC PANEL
Anion gap: 11 (ref 5–15)
BUN: 14 mg/dL (ref 6–20)
CO2: 22 mmol/L (ref 22–32)
Calcium: 8.9 mg/dL (ref 8.9–10.3)
Chloride: 104 mmol/L (ref 98–111)
Creatinine, Ser: 0.76 mg/dL (ref 0.61–1.24)
GFR, Estimated: >60 mL/min (ref >60)
Glucose, Bld: 172 mg/dL — ABNORMAL HIGH (ref 70–99)
Potassium: 3.5 mmol/L (ref 3.5–5.1)
Sodium: 137 mmol/L (ref 135–145)

## 2021-12-28 LAB — TROPONIN I (HIGH SENSITIVITY): Troponin I (High Sensitivity): 11 ng/L (ref <18)

## 2021-12-28 MED ORDER — NALOXONE HCL 4 MG/0.1ML NA LIQD
1.0000 | Freq: Once | NASAL | Status: AC
  Administered 2021-12-28: 1 via NASAL
  Filled 2021-12-28: qty 4

## 2021-12-28 NOTE — Discharge Instructions (Addendum)
There is help if you need it.  Please do not use dirty needles, this could cause you a severe infection to your skin, heart or spinal cord.  This could kill you or leave you permanently disabled.  There was a recent study done at [facility] that showed that the risk of death for someone that had unintentionally overdosed on narcotics was as high as 15% in the next year.  This is much higher than most every other medical condition.  [place] [facility]ioid Problem ([facility]STOP) Fixed; mobile; peer-based Chase Holleman [phone] [email or web address] Fixed site exchange at [employer], Wednesdays (2-5pm) and Thursdays (4-8pm). [address] [place], [place] [address] Call or text to arrange mobile and peer exchange, Mondays (1-4pm) and Fridays (4-7pm). Serving [place] [email or web address]  Suboxone clinic: [facility] [address] [place], [address]  [facility] [address] [place], [address]  [facility] [address] [place] [address]  

## 2021-12-28 NOTE — ED Notes (Signed)
Attempted to contact someone to come and pick pt up but pt only provided one phone number and that person Chief of Staff) was in [place] until tomorrow. Pt discharge instructions given to him as well as a take home narcan pack. Educated on how to [place] and pt understands. Pt exited to the lobby.

## 2021-12-28 NOTE — ED Triage Notes (Addendum)
Pt narcaned around 8:30pm last night. Pt states that after that he went to sleep and woke . Pt rambled about being chased by a dog and knocking on doors. Pt states his chest hurts now. Pt states his overdose was on what he thought was cocaine.
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
## 2022-01-08 ENCOUNTER — Emergency Department: Admit: 2022-01-08 | Payer: MEDICAID

## 2022-01-08 ENCOUNTER — Inpatient Hospital Stay
Admit: 2022-01-08 | Discharge: 2022-01-08 | Disposition: A | Discharge status: Home / Self Care | Payer: MEDICAID | Attending: Emergency Medicine

## 2022-01-08 DIAGNOSIS — R109 Unspecified abdominal pain: Secondary | ICD-10-CM

## 2022-01-08 LAB — BASIC METABOLIC PANEL
Anion Gap: 5 mmol/L (ref 3.0–18.0)
BUN: 14 mg/dL (ref 7–18)
Bun/Cre Ratio: 18 (ref 12–20)
CO2: 30 mmol/L (ref 21–32)
Calcium: 9.3 mg/dL (ref 8.5–10.1)
Chloride: 108 mmol/L (ref 100–111)
Creatinine: 0.78 mg/dL (ref 0.60–1.30)
ESTIMATED GLOMERULAR FILTRATION RATE: >60 mL/min/{1.73_m2} (ref >60)
Glucose: 78 mg/dL (ref 74–99)
Potassium: 3.3 mmol/L — ABNORMAL LOW (ref 3.5–5.5)
Sodium: 143 mmol/L (ref 136–145)

## 2022-01-08 LAB — URINALYSIS W/ RFLX MICROSCOPIC
Bilirubin, Urine: NEGATIVE
Bilirubin: NEGATIVE
Glucose, Ur: NEGATIVE mg/dL
Glucose: NEGATIVE mg/dL
Ketone: NEGATIVE mg/dL
Ketones, Urine: NEGATIVE mg/dL
Nitrite, Urine: NEGATIVE
Nitrites: NEGATIVE
Specific Gravity, UA: 1.02 (ref 1.005–1.030)
Specific gravity: 1.02 (ref 1.005–1.030)
Urobilinogen, UA, POCT: 1 EU/dL (ref 0.2–1.0)
Urobilinogen: 1 EU/dL (ref 0.2–1.0)
pH (UA): 6 (ref 5.0–8.0)
pH, UA: 6 (ref 5.0–8.0)

## 2022-01-08 LAB — CBC WITH AUTO DIFFERENTIAL
Basophils %: 0 % (ref 0–2)
Basophils Absolute: 0 10*3/uL (ref 0.0–0.1)
Eosinophils %: 2 % (ref 0–5)
Eosinophils Absolute: 0.1 10*3/uL (ref 0.0–0.4)
Granulocyte Absolute Count: 0 10*3/uL (ref 0.00–0.04)
Hematocrit: 45.4 % (ref 36.0–48.0)
Hemoglobin: 15.1 g/dL (ref 13.0–16.0)
Immature Granulocytes: 0 % (ref 0–0.5)
Lymphocytes %: 21 % (ref 21–52)
Lymphocytes Absolute: 1.6 10*3/uL (ref 0.9–3.6)
MCH: 30.1 PG (ref 24.0–34.0)
MCHC: 33.3 g/dL (ref 31.0–37.0)
MCV: 90.4 FL (ref 78.0–100.0)
MPV: 9.4 FL (ref 9.2–11.8)
Monocytes %: 9 % (ref 3–10)
Monocytes Absolute: 0.7 10*3/uL (ref 0.05–1.2)
NRBC Absolute: 0 10*3/uL (ref 0.00–0.01)
Neutrophils %: 68 % (ref 40–73)
Neutrophils Absolute: 5.2 10*3/uL (ref 1.8–8.0)
Nucleated RBCs: 0 PER 100 WBC
Platelets: 214 10*3/uL (ref 135–420)
RBC: 5.02 M/uL (ref 4.35–5.65)
RDW: 12.8 % (ref 11.6–14.5)
WBC: 7.6 10*3/uL (ref 4.6–13.2)

## 2022-01-08 LAB — URINE MICROSCOPIC

## 2022-01-08 LAB — CBC WITH AUTOMATED DIFF
ABS. BASOPHILS: 0 10*3/uL (ref 0.0–0.1)
ABS. EOSINOPHILS: 0.1 10*3/uL (ref 0.0–0.4)
ABS. IMM. GRANS.: 0 10*3/uL (ref 0.00–0.04)
ABS. LYMPHOCYTES: 1.6 10*3/uL (ref 0.9–3.6)
ABS. MONOCYTES: 0.7 10*3/uL (ref 0.05–1.2)
ABS. NEUTROPHILS: 5.2 10*3/uL (ref 1.8–8.0)
ABSOLUTE NRBC: 0 10*3/uL (ref 0.00–0.01)
BASOPHILS: 0 % (ref 0–2)
EOSINOPHILS: 2 % (ref 0–5)
HCT: 45.4 % (ref 36.0–48.0)
HGB: 15.1 g/dL (ref 13.0–16.0)
IMMATURE GRANULOCYTES: 0 % (ref 0–0.5)
LYMPHOCYTES: 21 % (ref 21–52)
MCH: 30.1 PG (ref 24.0–34.0)
MCHC: 33.3 g/dL (ref 31.0–37.0)
MCV: 90.4 FL (ref 78.0–100.0)
MONOCYTES: 9 % (ref 3–10)
MPV: 9.4 FL (ref 9.2–11.8)
NEUTROPHILS: 68 % (ref 40–73)
NRBC: 0 PER 100 WBC
PLATELET: 214 10*3/uL (ref 135–420)
RBC: 5.02 M/uL (ref 4.35–5.65)
RDW: 12.8 % (ref 11.6–14.5)
WBC: 7.6 10*3/uL (ref 4.6–13.2)

## 2022-01-08 LAB — METABOLIC PANEL, BASIC
Anion gap: 5 mmol/L (ref 3.0–18.0)
BUN/Creatinine ratio: 18 (ref 12–20)
BUN: 14 mg/dL (ref 7–18)
CO2: 30 mmol/L (ref 21–32)
Calcium: 9.3 mg/dL (ref 8.5–10.1)
Chloride: 108 mmol/L (ref 100–111)
Creatinine: 0.78 mg/dL (ref 0.60–1.30)
Glucose: 78 mg/dL (ref 74–99)
Potassium: 3.3 mmol/L — ABNORMAL LOW (ref 3.5–5.5)
Sodium: 143 mmol/L (ref 136–145)
eGFR: >60 mL/min/{1.73_m2} (ref >60)

## 2022-01-08 MED ORDER — HYDROMORPHONE 1 MG/ML INJECTION SOLUTION
1 mg/mL | Freq: Once | INTRAMUSCULAR | Status: AC
Start: 2022-01-08 — End: 2022-01-08
  Administered 2022-01-08: 19:00:00 via INTRAVENOUS

## 2022-01-08 MED ORDER — KETOROLAC TROMETHAMINE 10 MG TAB
10 mg | ORAL_TABLET | Freq: Four times a day (QID) | ORAL | 0 refills | Status: AC | PRN
Start: 2022-01-08 — End: ?

## 2022-01-08 MED ORDER — KETOROLAC TROMETHAMINE 30 MG/ML INJECTION
30 mg/mL (1 mL) | INTRAMUSCULAR | Status: AC
Start: 2022-01-08 — End: 2022-01-08
  Administered 2022-01-08: 18:00:00 via INTRAVENOUS

## 2022-01-08 MED ORDER — SODIUM CHLORIDE 0.9% BOLUS IV
0.9 % | Freq: Once | INTRAVENOUS | Status: AC
Start: 2022-01-08 — End: 2022-01-08
  Administered 2022-01-08: 18:00:00 via INTRAVENOUS

## 2022-01-08 MED FILL — HYDROMORPHONE 1 MG/ML INJECTION SOLUTION: 1 mg/mL | INTRAMUSCULAR | Qty: 1

## 2022-01-08 MED FILL — SODIUM CHLORIDE 0.9 % IV: INTRAVENOUS | Qty: 1000

## 2022-01-08 MED FILL — KETOROLAC TROMETHAMINE 30 MG/ML INJECTION: 30 mg/mL (1 mL) | INTRAMUSCULAR | Qty: 1

## 2022-01-08 NOTE — ED Provider Notes (Signed)
ED Provider Notes by Luanna Salk, MD at 01/08/22 1348                Author: Luanna Salk, MD  Service: EMERGENCY  Author Type: Physician       Filed: 01/08/22 1358  Date of Service: 01/08/22 1348  Status: Signed          Editor: Luanna Salk, MD (Physician)               [facility] EMERGENCY DEPT   EMERGENCY DEPARTMENT HISTORY AND PHYSICAL EXAM           Date: 01/08/2022   Patient Name: Parker Lewis   MRN: [id number]   [place]: [birth year]   Date of evaluation: 01/08/2022   Provider: Luanna Salk, MD    Note Started: 1:48 PM 01/08/22        HISTORY OF PRESENT ILLNESS          Chief Complaint       Patient presents with        ?  Urinary Pain           History Provided By: Patient      HPI: Parker Lewis, 41 y.o. male with h/o kidney stone 10 yr ago; polysubstance abuse; suicidal ideation that c/o right flank pain x 2 d ago.  Described as constant dull non radiating; no n/v; no fever; mild dysuria. No testicular pain        PAST MEDICAL HISTORY     Past Medical History:   History reviewed. No pertinent past medical history.      Past Surgical History:     Past Surgical History:         Procedure  Laterality  Date          ?  HX GI               Family History:   No family history on file.      Social History:     Social History          Tobacco Use         ?  Smoking status:  Every Day              Packs/day:  1.00         Types:  Cigarettes         ?  Smokeless tobacco:  Never       Substance Use Topics         ?  Alcohol use:  Not Currently             Comment: socially         ?  Drug use:  Never           Allergies:     Allergies        Allergen  Reactions         ?  Pcn [Penicillins]  Hives         ?  Sulfa (Sulfonamide Antibiotics)  Hives           PCP: None      Current Meds:      Previous Medications           CYCLOBENZAPRINE (FLEXERIL) 10 MG TABLET     Take 1 Tablet by mouth three (3) times daily as needed for Muscle Spasm(s).             REVIEW OF SYSTEMS  Review of  Systems    Constitutional:  Negative for chills and fever.    HENT:  Negative for sore throat.     Eyes:  Negative for visual disturbance.    Respiratory:  Negative for cough and shortness of breath.     Cardiovascular:  Negative for chest pain.    Gastrointestinal:  Negative for abdominal pain, diarrhea, nausea and vomiting.    Genitourinary:  Positive for dysuria and flank pain . Negative for testicular pain.    Musculoskeletal:  Negative for arthralgias and myalgias.    Skin:  Negative for rash.    Neurological:  Negative for headaches.    Psychiatric/Behavioral:  Negative for confusion.     Positives and Pertinent negatives as per HPI.        PHYSICAL EXAM          ED Triage Vitals     ED Encounter Vitals Group            BP  01/08/22 1150  123/67        Pulse (Heart Rate)  01/08/22 1137  88        Resp Rate  01/08/22 1137  16        Temp  01/08/22 1137  98 ??F (36.7 ??C)        Temp src  --          O2 Sat (%)  01/08/22 1137  99 %        Weight  01/08/22 1137  187 lb            Height  01/08/22 1137  5' 9"         Physical Exam   Vitals and nursing note reviewed.    Constitutional:        General: He is not in acute distress.      Appearance: He is well-developed. He is not ill-appearing or diaphoretic.       Comments: Appears uncomfortable due to right flank pain; no n/v    HENT:       Head: Normocephalic.       Mouth/Throat:       Pharynx: No oropharyngeal exudate.    Eyes:       Extraocular Movements: Extraocular movements intact.       Conjunctiva/sclera: Conjunctivae normal.     Cardiovascular:       Rate and Rhythm: Normal rate and regular rhythm.       Heart sounds: Normal heart sounds.    Pulmonary:       Effort: Pulmonary effort is normal. No respiratory distress.       Breath sounds: No wheezing.     Abdominal:       General: Bowel sounds are normal. There is no distension.       Palpations: Abdomen is soft.       Tenderness: There is no abdominal tenderness. There is right CVA tenderness. There is no  rebound.    Genitourinary:      Testes: Normal.    Musculoskeletal:           General: No tenderness. Normal range of motion.       Cervical back: Neck supple.    Skin:      General: Skin is warm and dry.    Neurological:       Mental Status: He is alert and oriented to person, place, and time.    Psychiatric:  Behavior: Behavior normal.            SCREENINGS                    No data recorded             LAB, EKG AND DIAGNOSTIC RESULTS     Labs:     Recent Results (from the past 12 hour(s))     URINALYSIS W/ RFLX MICROSCOPIC          Collection Time: 01/08/22 11:40 AM         Result  Value  Ref Range            Color  Yellow          Appearance  Clear          Specific gravity  1.020  1.005 - 1.030         pH (UA)  6.0  5.0 - 8.0         Protein  Trace (A)  Negative mg/dL       Glucose  Negative  Negative mg/dL       Ketone  Negative  Negative mg/dL       Bilirubin  Negative  Negative         Blood  Large (A)  Negative         Urobilinogen  1.0  0.2 - 1.0 EU/dL       Nitrites  Negative  Negative         Leukocyte Esterase  Trace (A)  Negative         URINE MICROSCOPIC          Collection Time: 01/08/22 11:40 AM         Result  Value  Ref Range            WBC  0-4  0 - 4 /hpf       RBC  50-100  0 - 2 /hpf       Epithelial cells  Few  0 - 20 /lpf       Bacteria  1+ (A)  None /hpf       CBC WITH AUTOMATED DIFF          Collection Time: 01/08/22 12:30 PM         Result  Value  Ref Range            WBC  7.6  4.6 - 13.2 K/uL       RBC  5.02  4.35 - 5.65 M/uL       HGB  15.1  13.0 - 16.0 g/dL       HCT  45.4  36.0 - 48.0 %       MCV  90.4  78.0 - 100.0 FL       MCH  30.1  24.0 - 34.0 PG       MCHC  33.3  31.0 - 37.0 g/dL       RDW  12.8  11.6 - 14.5 %       PLATELET  214  135 - 420 K/uL       MPV  9.4  9.2 - 11.8 FL       NRBC  0.0  0.0 PER 100 WBC       ABSOLUTE NRBC  0.00  0.00 - 0.01 K/uL       NEUTROPHILS  68  40 - 73 %  LYMPHOCYTES  21  21 - 52 %       MONOCYTES  9  3 - 10 %       EOSINOPHILS  2  0 - 5  %       BASOPHILS  0  0 - 2 %       IMMATURE GRANULOCYTES  0  0 - 0.5 %       ABS. NEUTROPHILS  5.2  1.8 - 8.0 K/UL       ABS. LYMPHOCYTES  1.6  0.9 - 3.6 K/UL       ABS. MONOCYTES  0.7  0.05 - 1.2 K/UL       ABS. EOSINOPHILS  0.1  0.0 - 0.4 K/UL       ABS. BASOPHILS  0.0  0.0 - 0.1 K/UL       ABS. IMM. GRANS.  0.0  0.00 - 0.04 K/UL       DF  AUTOMATED          METABOLIC PANEL, BASIC          Collection Time: 01/08/22 12:30 PM         Result  Value  Ref Range            Sodium  143  136 - 145 mmol/L       Potassium  3.3 (L)  3.5 - 5.5 mmol/L       Chloride  108  100 - 111 mmol/L       CO2  30  21 - 32 mmol/L       Anion gap  5  3.0 - 18.0 mmol/L       Glucose  78  74 - 99 mg/dL       BUN  14  7 - 18 mg/dL       Creatinine  0.78  0.60 - 1.30 mg/dL       BUN/Creatinine ratio  18  12 - 20         eGFR  >60  >60 ml/min/1.4m            Calcium  9.3  8.5 - 10.1 mg/dL           EKG: Initial EKG interpreted by me. Shows      Radiologic Studies:   Non-plain film images such as CT, Ultrasound and MRI are read by the radiologist. Plain radiographic images are visualized and preliminarily interpreted by the ED Provider with the below findings:       41y/o male with right flank pain with possible kidney stones. Other differentials are  diverticulitis, epididymitis, pyelonephritis, prostatitis, pancreatitis, lower lobe pneumonia, musculoskeletal pain, cystitis, renal infarction,  renal mass; polysubstance  abuse / drug seeking.- will give benefit of doubt since pt appears to be in pain and has hematuria..         Interpretation per the Radiologist below, if available at the time of this note:   CT ABD PELV WO CONT      Result Date: 01/08/2022   INDICATION:  right flank pain h/ o stones EXAM: ABDOMINAL AND PELVIC CT WITHOUT CONTRAST. COMPARISON: None. PROCEDURE: Sequential axial images of the abdomen and pelvis were performed without intravenous or oral contrast . Soft tissue, lung and bone windows  were examined Post-processing  was performed for coronal and sagittal reformatting. CT dose reduction was achieved through use of a standardized protocol tailored for this examination and automatic exposure control for dose modulation. ABDOMEN: The  lung  bases are clear. Liver and spleen parenchyma are homogeneous without contrast. The spleen is enlarged at 14.1 cm span. The gallbladder is contracted without obvious calculi. The pancreas and adrenal glands are unremarkable without contrast.The kidneys  are normal in size bilaterally, and the ureters are nondilated.  There are no calcifications  . Bowel loops are nondilated and there is no ascites. Overall sensitivity to bowel abnormalities is significantly decreased without oral or intravenous contrast,  however. PELVIS: Additional evaluation of the pelvis reveals the appendix is not seen, and there are surgical clips in the right lower quadrant with anastomotic staples at the cecal tip. Diverticula without associated acute inflammatory change.. The distal  ureters are nondilated. The bladder is minimally distended and otherwise unremarkable.  There is no abnormal mass or fluid collection.       1. No CT evidence for urolithiasis or urinary obstruction at this time. 2. Splenomegaly. 3. Diverticulosis without diverticulitis. 4. Other incidental and postoperative changes described above.           PROCEDURES     Unless otherwise noted below, none.   Performed by: Luanna Salk, MD    Procedures          CRITICAL CARE TIME             ED COURSE and DIFFERENTIAL DIAGNOSIS/MDM     Vitals:       Vitals:           01/08/22 1137  01/08/22 1150         BP:    123/67     Pulse:  88       Resp:  16       Temp:  98 ??F (36.7 ??C)       SpO2:  99%       Weight:  84.8 kg (187 lb)           Height:  5' 9" (1.753 m)              Patient was given the following medications:     Medications       sodium chloride 0.9 % bolus infusion 500 mL (500 mL IntraVENous New Bag 01/08/22 1329)     ketorolac (TORADOL)  injection 15 mg (15 mg IntraVENous Given 01/08/22 1307)     ketorolac (TORADOL) injection 15 mg (15 mg IntraVENous Given 01/08/22 1328)       HYDROmorphone (DILAUDID) injection 1 mg (1 mg IntraVENous Given 01/08/22 1341)          FINAL IMPRESSION           1.  Right flank pain                DISPOSITION/PLAN     Discharged      Discharge Note: The patient is stable for discharge home. The signs, symptoms, diagnosis, and discharge instructions have been discussed, understanding conveyed, and agreed upon. The patient is to follow up as recommended or return to ER should their  symptoms worsen.        PATIENT REFERRED TO:     Follow-up Information                  Follow up With  Specialties  Details  Why  Contact Info              Rosalene Billings, NP  Nurse Practitioner  Schedule an appointment as soon as possible for a visit in 1  week    [place] [address]   [phone]                          DISCHARGE MEDICATIONS:     Current Discharge Medication List                 START taking these medications          Details        ketorolac (TORADOL) 10 mg tablet  Take 1 Tablet by mouth every six (6) hours as needed for Pain for up to 10 doses.   Qty: 10 Tablet, Refills: 0   Start date: 01/08/2022                           DISCONTINUED MEDICATIONS:     Current Discharge Medication List                  I am the Primary Clinician of Record: Luanna Salk, MD (electronically signed)      (Please note that parts of this dictation were completed with voice recognition software. Quite often unanticipated grammatical, syntax, homophones, and other interpretive errors are inadvertently transcribed  by the computer software. Please disregards these errors. Please excuse any errors that have escaped final proofreading.)

## 2022-01-08 NOTE — ED Notes (Signed)
Painful urination day 3. Also reports low back pain. Stated history of kidney stones. Thinks he may have passed one last night.

## 2022-04-09 ENCOUNTER — Emergency Department (HOSPITAL_COMMUNITY): Payer: Medicaid - Out of State

## 2022-04-09 ENCOUNTER — Other Ambulatory Visit: Payer: Self-pay

## 2022-04-09 ENCOUNTER — Emergency Department (HOSPITAL_COMMUNITY)
Admission: EM | Admit: 2022-04-09 | Discharge: 2022-04-09 | Disposition: A | Discharge status: Home / Self Care | Payer: Medicaid - Out of State | Attending: Emergency Medicine | Admitting: Emergency Medicine

## 2022-04-09 ENCOUNTER — Encounter (HOSPITAL_COMMUNITY): Payer: Self-pay | Admitting: *Deleted

## 2022-04-09 DIAGNOSIS — M25521 Pain in right elbow: Secondary | ICD-10-CM | POA: Diagnosis present

## 2022-04-09 DIAGNOSIS — I10 Essential (primary) hypertension: Secondary | ICD-10-CM | POA: Insufficient documentation

## 2022-04-09 DIAGNOSIS — W1830XA Fall on same level, unspecified, initial encounter: Secondary | ICD-10-CM | POA: Diagnosis not present

## 2022-04-09 NOTE — ED Triage Notes (Signed)
Pt states he fell yesterday landing on his right elbow; pt states he woke up this am with pain and swelling and redness to the elbow and very painful to touch ?

## 2022-04-09 NOTE — Discharge Instructions (Addendum)

## 2022-04-09 NOTE — ED Provider Notes (Signed)
?[employer] ?Provider Note ? ? ?CSN: [id number] ?Arrival date & time: 04/09/22  1316 ? ?  ? ?History ?PMH: poly substance use disorder, HTN ?Chief Complaint  ?Patient presents with  ? Elbow Pain  ? ? ?Carl Valencia is a 41 y.o. male. ?Presents the ED with a chief complaint of right elbow pain.  He says yesterday while he was working and he fell and landed on his right elbow.  He noticed some pain at the time but it was not that significant.  He woke up today and he had pain and swelling to the posterior elbow.  He says every time he touches it he gets radiating pain down to his fingers.  He is able to bend his elbow completely.  He denies any fevers or chills.  Denies numbness ? ?HPI ? ?  ? ?Home Medications ?Prior to Admission medications   ?Medication Sig Start Date End Date Taking? Authorizing Provider  ?doxycycline (VIBRAMYCIN) 100 MG capsule Take 1 capsule (100 mg total) by mouth 2 (two) times daily. ?Patient not taking: Reported on 09/25/2021 09/25/21   Quintella Reichert, MD  ?gabapentin (NEURONTIN) 300 MG capsule Take 1 capsule (300 mg total) by mouth 3 (three) times daily. ?Patient not taking: Reported on 09/25/2021 07/24/21   Sharma Covert, MD  ?   ? ?Allergies    ?Cyclobenzaprine, Darvocet [propoxyphene n-acetaminophen], Nsaids, Penicillins, Sulfa antibiotics, Toradol [ketorolac tromethamine], Morphine and related, Trazodone and nefazodone, and Tylenol [acetaminophen]   ? ?Review of Systems   ?Review of Systems  ?Musculoskeletal:  Positive for arthralgias.  ?All other systems reviewed and are negative. ? ?Physical Exam ?Updated Vital Signs ?BP (!) 156/110 (BP Location: Left Arm)   Pulse 90   Temp 98.2 ?F (36.8 ?C) (Temporal)   Resp 18   Ht 5\' 9"  (1.753 m)   Wt 78.9 kg   SpO2 99%   BMI 25.70 kg/m?  ?Physical Exam ?Vitals and nursing note reviewed.  ?Constitutional:   ?   General: He is not in acute distress. ?   Appearance: Normal appearance. He is well-developed. He is not  ill-appearing, toxic-appearing or diaphoretic.  ?HENT:  ?   Head: Normocephalic and atraumatic.  ?   Nose: No nasal deformity.  ?   Mouth/Throat:  ?   Lips: Pink. No lesions.  ?Eyes:  ?   General: Gaze aligned appropriately. No scleral icterus.    ?   Right eye: No discharge.     ?   Left eye: No discharge.  ?   Conjunctiva/sclera: Conjunctivae normal.  ?   Right eye: Right conjunctiva is not injected. No exudate or hemorrhage. ?   Left eye: Left conjunctiva is not injected. No exudate or hemorrhage. ?Pulmonary:  ?   Effort: Pulmonary effort is normal. No respiratory distress.  ?Musculoskeletal:  ?   Comments: There is swelling to the posterior side of the elbow that appears to be localized.  Overlying bruising.  Not fluctuant.  Range of motion of elbow intact.  Radial pulse 2+.  Sensation intact distally.  ?Skin: ?   General: Skin is warm and dry.  ?Neurological:  ?   Mental Status: He is alert and oriented to person, place, and time.  ?Psychiatric:     ?   Mood and Affect: Mood normal.     ?   Speech: Speech normal.     ?   Behavior: Behavior normal. Behavior is cooperative.  ? ? ?ED Results / Procedures / Treatments   ?  Labs ?(all labs ordered are listed, but only abnormal results are displayed) ?Labs Reviewed - No data to display ? ?EKG ?None ? ?Radiology ?DG Elbow Complete Right ? ?Result Date: 04/09/2022 ?CLINICAL DATA:  Fall EXAM: RIGHT ELBOW - COMPLETE 3+ VIEW COMPARISON:  None. FINDINGS: No signs of joint effusion. No fracture or dislocation. No significant arthropathy. There is a small linear metallic foreign body within the medial soft tissues of the proximal forearm measuring approximately 5 mm. Unchanged from 2017. IMPRESSION: 1. No acute findings. 2. Small linear metallic foreign body within the medial soft tissues of the proximal forearm. The appearance suggest retained needle fragment. This has been present since 2017 and is not significantly changed Electronically Signed   By: Kerby Moors M.D.    On: 04/09/2022 14:12   ? ?Procedures ?Procedures  ? ? ?Medications Ordered in ED ?Medications - No data to display ? ?ED Course/ Medical Decision Making/ A&P ?  ?                        ?Medical Decision Making ?Amount and/or Complexity of Data Reviewed ?Radiology: ordered. ? ? ?Presents the ED with right elbow pain after a fall on it yesterday.  The x-ray that we got today showed no acute fracture.  There was a linear metallic foreign body which has been present since 2017 with no change.  No need to do anything about this today.  Exam is reassuring with no neurovascular involvement.  Swelling is overlying the posterior elbow and consistent with the area that he fell on.  This does not appear to be an abscess.  He does have some shooting pains going down his arm, which is likely related to compression of the medial radial nerve from the posterior elbow swelling.  I recommend RICE treatment at this time. ? ? ?Final Clinical Impression(s) / ED Diagnoses ?Final diagnoses:  ?Right elbow pain  ? ? ?Rx / DC Orders ?ED Discharge Orders   ? ? None  ? ?  ? ? ?  ?Adolphus Birchwood, PA-C ?04/09/22 1822 ? ?  ?Milton Ferguson, MD ?04/11/22 [phone] ? ?
